# Patient Record
Sex: Female | Born: 1990 | Race: Black or African American | Hispanic: No | Smoking: Never smoker
Health system: Southern US, Community
[De-identification: ages and names within clinical notes are randomized; demographics above are authoritative.]

## PROBLEM LIST (undated history)

## (undated) ENCOUNTER — Emergency Department (HOSPITAL_COMMUNITY): Payer: No Typology Code available for payment source | Source: Home / Self Care

## (undated) ENCOUNTER — Inpatient Hospital Stay (HOSPITAL_COMMUNITY): Payer: Self-pay

## (undated) DIAGNOSIS — J45909 Unspecified asthma, uncomplicated: Secondary | ICD-10-CM

## (undated) DIAGNOSIS — E049 Nontoxic goiter, unspecified: Secondary | ICD-10-CM

## (undated) DIAGNOSIS — F411 Generalized anxiety disorder: Secondary | ICD-10-CM

## (undated) DIAGNOSIS — M419 Scoliosis, unspecified: Secondary | ICD-10-CM

## (undated) DIAGNOSIS — R519 Headache, unspecified: Secondary | ICD-10-CM

## (undated) DIAGNOSIS — N390 Urinary tract infection, site not specified: Secondary | ICD-10-CM

## (undated) DIAGNOSIS — O24419 Gestational diabetes mellitus in pregnancy, unspecified control: Secondary | ICD-10-CM

## (undated) HISTORY — DX: Gestational diabetes mellitus in pregnancy, unspecified control: O24.419

## (undated) HISTORY — PX: OTHER SURGICAL HISTORY: SHX169

---
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
## 2008-04-02 ENCOUNTER — Emergency Department (HOSPITAL_BASED_OUTPATIENT_CLINIC_OR_DEPARTMENT_OTHER): Admission: EM | Admit: 2008-04-02 | Discharge: 2008-04-02 | Payer: Self-pay | Admitting: Emergency Medicine

## 2008-04-02 ENCOUNTER — Ambulatory Visit: Payer: Self-pay | Admitting: Diagnostic Radiology

## 2008-04-19 ENCOUNTER — Emergency Department (HOSPITAL_BASED_OUTPATIENT_CLINIC_OR_DEPARTMENT_OTHER): Admission: EM | Admit: 2008-04-19 | Discharge: 2008-04-19 | Payer: Self-pay | Admitting: Emergency Medicine

## 2008-07-03 ENCOUNTER — Emergency Department (HOSPITAL_BASED_OUTPATIENT_CLINIC_OR_DEPARTMENT_OTHER): Admission: EM | Admit: 2008-07-03 | Discharge: 2008-07-04 | Payer: Self-pay | Admitting: Emergency Medicine

## 2008-07-19 ENCOUNTER — Emergency Department (HOSPITAL_BASED_OUTPATIENT_CLINIC_OR_DEPARTMENT_OTHER): Admission: EM | Admit: 2008-07-19 | Discharge: 2008-07-19 | Payer: Self-pay | Admitting: Emergency Medicine

## 2009-02-16 ENCOUNTER — Emergency Department (HOSPITAL_BASED_OUTPATIENT_CLINIC_OR_DEPARTMENT_OTHER): Admission: EM | Admit: 2009-02-16 | Discharge: 2009-02-16 | Payer: Self-pay | Admitting: Emergency Medicine

## 2009-10-08 ENCOUNTER — Emergency Department (HOSPITAL_BASED_OUTPATIENT_CLINIC_OR_DEPARTMENT_OTHER): Admission: EM | Admit: 2009-10-08 | Discharge: 2009-10-08 | Payer: Self-pay | Admitting: Emergency Medicine

## 2010-03-31 LAB — URINE MICROSCOPIC-ADD ON

## 2010-03-31 LAB — URINALYSIS, ROUTINE W REFLEX MICROSCOPIC
Bilirubin Urine: NEGATIVE
Glucose, UA: NEGATIVE mg/dL
Ketones, ur: NEGATIVE mg/dL
Urobilinogen, UA: 1 mg/dL (ref 0.0–1.0)
pH: 6 (ref 5.0–8.0)

## 2010-03-31 LAB — PREGNANCY, URINE: Preg Test, Ur: NEGATIVE

## 2010-04-25 LAB — URINE MICROSCOPIC-ADD ON

## 2010-04-25 LAB — URINALYSIS, ROUTINE W REFLEX MICROSCOPIC: Urobilinogen, UA: 1 mg/dL (ref 0.0–1.0)

## 2010-05-04 ENCOUNTER — Emergency Department (HOSPITAL_BASED_OUTPATIENT_CLINIC_OR_DEPARTMENT_OTHER)
Admission: EM | Admit: 2010-05-04 | Discharge: 2010-05-04 | Disposition: A | Discharge status: Home / Self Care | Payer: Self-pay | Attending: Emergency Medicine | Admitting: Emergency Medicine

## 2010-05-04 ENCOUNTER — Emergency Department (INDEPENDENT_AMBULATORY_CARE_PROVIDER_SITE_OTHER): Payer: Self-pay

## 2010-05-04 DIAGNOSIS — R079 Chest pain, unspecified: Secondary | ICD-10-CM | POA: Insufficient documentation

## 2010-05-04 DIAGNOSIS — R0602 Shortness of breath: Secondary | ICD-10-CM | POA: Insufficient documentation

## 2010-05-31 NOTE — Consult Note (Signed)
[name], [name] NO.:  [id number]   MEDICAL RECORD NO.:  [id number]          PATIENT TYPE:  EMS   LOCATION:  ED                           FACILITY:  [facility]   PHYSICIAN:  Dionne Ano. Gramig III, M.D.DATE OF BIRTH:  22-Dec-1990   DATE OF CONSULTATION:  01/14/2007  DATE OF DISCHARGE:                                 CONSULTATION   INDICATIONS:  I was asked to see Sierra Evans in the early morning  hours of January 14, 2007.  Kalynne was in the shower at 3:15 this a.m.,  fell, and sustained a fracture dislocation to her small finger left  hand.  She is right-hand dominant.  She denies other complaints at the  present time.  She was seen by the emergency room staff, and  I was  asked to see her.   ALLERGIES:  None.   MEDICATIONS:  None.   PAST MEDICAL HISTORY:  None.   PAST SURGICAL HISTORY:  None.   SOCIAL HISTORY:  She is enrolled in school.  She works at [employer], and denies illicit drug use or smoking history.   PHYSICAL EXAMINATION:  GENERAL:  She is alert and oriented, in no acute  distress.  VITAL SIGNS:  Stable.  CHEST/LUNGS:  Normal.  ABDOMEN:  Nontender.  EXTREMITIES:  Right upper extremity is neurovascularly intact and  atraumatic.  Left hand has refill to the distal tip, but a fracture  dislocation which is obvious about the small finger.  PIP and MCP joints  are normal.  There are no signs of infection.  There is no open injury.  Her skin is intact.   I have reviewed x-rays which show a fracture dislocation of the distal  interphalangeal joint.   IMPRESSION:  Fracture dislocation left small finger.   PLAN:  I have given her a flexor tendon sheath block.  Following this,  she underwent manipulative reduction.  She was then splinted.  I asked  her to stay in a full extension posture until I see her back.  Postreduction x-rays were adequate.  We will see her back Wednesday at  11 a.m.  I asked her to notify us if she has  any problems.  She was  written for Vicodin for postreduction pain, and all questions were  encouraged and answered.  Out-of-work note was written indefinitely  until she recovers and is stable for work activity.          ______________________________  Dionne Ano. Everlene Other, M.D.    Nash Mantis  D:  01/14/2007  T:  01/14/2007  Job:  [id number]

## 2010-07-15 ENCOUNTER — Emergency Department (HOSPITAL_BASED_OUTPATIENT_CLINIC_OR_DEPARTMENT_OTHER)
Admission: EM | Admit: 2010-07-15 | Discharge: 2010-07-15 | Disposition: A | Discharge status: Home / Self Care | Payer: Self-pay | Attending: Emergency Medicine | Admitting: Emergency Medicine

## 2010-07-15 DIAGNOSIS — B019 Varicella without complication: Secondary | ICD-10-CM | POA: Insufficient documentation

## 2010-08-25 ENCOUNTER — Emergency Department (HOSPITAL_BASED_OUTPATIENT_CLINIC_OR_DEPARTMENT_OTHER)
Admission: EM | Admit: 2010-08-25 | Discharge: 2010-08-25 | Disposition: A | Discharge status: Home / Self Care | Payer: Self-pay | Attending: Emergency Medicine | Admitting: Emergency Medicine

## 2010-08-25 DIAGNOSIS — R112 Nausea with vomiting, unspecified: Secondary | ICD-10-CM

## 2010-08-25 DIAGNOSIS — O210 Mild hyperemesis gravidarum: Secondary | ICD-10-CM | POA: Insufficient documentation

## 2010-08-25 DIAGNOSIS — O2390 Unspecified genitourinary tract infection in pregnancy, unspecified trimester: Secondary | ICD-10-CM | POA: Insufficient documentation

## 2010-08-25 DIAGNOSIS — Z3201 Encounter for pregnancy test, result positive: Secondary | ICD-10-CM

## 2010-08-25 DIAGNOSIS — N390 Urinary tract infection, site not specified: Secondary | ICD-10-CM | POA: Insufficient documentation

## 2010-08-25 LAB — URINALYSIS, ROUTINE W REFLEX MICROSCOPIC
Glucose, UA: NEGATIVE mg/dL
Ketones, ur: NEGATIVE mg/dL
Nitrite: NEGATIVE
Specific Gravity, Urine: 1.021 (ref 1.005–1.030)
Urobilinogen, UA: 1 mg/dL (ref 0.0–1.0)
pH: 6 (ref 5.0–8.0)

## 2010-08-25 LAB — URINE MICROSCOPIC-ADD ON

## 2010-08-25 LAB — PREGNANCY, URINE: Preg Test, Ur: POSITIVE

## 2010-08-25 MED ORDER — NITROFURANTOIN MONOHYD MACRO 100 MG PO CAPS: 100.0000 mg | ORAL_CAPSULE | Freq: Two times a day (BID) | ORAL | Status: AC

## 2010-08-25 MED ORDER — ONDANSETRON HCL 4 MG PO TABS: 4.0000 mg | ORAL_TABLET | Freq: Four times a day (QID) | ORAL | Status: AC

## 2010-08-25 NOTE — ED Provider Notes (Addendum)
History     CSN: [id number] Arrival date & time: 08/25/2010 12:23 PM  Chief Complaint  Patient presents with  . Nausea   HPI Comments: Pt states that she has not had a period and she has a positive pregnancy test at home and she wants a confirmation  Patient is a 20 y.o. female presenting with vomiting. The history is provided by the patient. No language interpreter was used.  Emesis  This is a new problem. The current episode started more than 2 days ago. The problem occurs 2 to 4 times per day. The problem has not changed since onset.The emesis has an appearance of stomach contents. There has been no fever. Pertinent negatives include no chills, no cough, no diarrhea, no fever and no headaches.    History reviewed. No pertinent past medical history.  History reviewed. No pertinent past surgical history.  No family history on file.  History  Substance Use Topics  . Smoking status: Never Smoker   . Smokeless tobacco: Not on file  . Alcohol Use: No    OB History    Grav Para Term Preterm Abortions TAB SAB Ect Mult Living                  Review of Systems  Constitutional: Negative for fever and chills.  Respiratory: Negative for cough.   Gastrointestinal: Positive for vomiting. Negative for diarrhea.  Neurological: Negative for headaches.  All other systems reviewed and are negative.    Physical Exam  BP 109/58  Pulse 68  Temp(Src) 98.6 F (37 C) (Oral)  Resp 16  Ht 5\' 2"  (1.575 m)  Wt 128 lb 9.6 oz (58.333 kg)  BMI 23.52 kg/m2  SpO2 100%  LMP 06/18/2010  Physical Exam  Nursing note and vitals reviewed. Constitutional: She is oriented to person, place, and time. She appears well-developed and well-nourished.  HENT:  Head: Normocephalic.  Neck: Normal range of motion.  Cardiovascular: Normal rate and regular rhythm.   Pulmonary/Chest: Effort normal.  Abdominal: Soft. Bowel sounds are normal. There is no tenderness.  Musculoskeletal: Normal range of motion.   Neurological: She is alert and oriented to person, place, and time.  Skin: Skin is warm and dry.  Psychiatric: She has a normal mood and affect.    ED Course  Procedures Results for orders placed during the hospital encounter of 08/25/10  URINALYSIS, ROUTINE W REFLEX MICROSCOPIC      Component Value Range   Color, Urine YELLOW  YELLOW    Appearance CLOUDY (*) CLEAR    Specific Gravity, Urine 1.021  1.005 - 1.030    pH 6.0  5.0 - 8.0    Glucose, UA NEGATIVE  NEGATIVE (mg/dL)   Hgb urine dipstick NEGATIVE  NEGATIVE    Bilirubin Urine NEGATIVE  NEGATIVE    Ketones, ur NEGATIVE  NEGATIVE (mg/dL)   Protein, ur NEGATIVE  NEGATIVE (mg/dL)   Urobilinogen, UA 1.0  0.0 - 1.0 (mg/dL)   Nitrite NEGATIVE  NEGATIVE    Leukocytes, UA SMALL (*) NEGATIVE   PREGNANCY, URINE      Component Value Range   Preg Test, Ur POSITIVE    URINE MICROSCOPIC-ADD ON      Component Value Range   Squamous Epithelial / LPF RARE  RARE    WBC, UA 0-2  <3 (WBC/hpf)   Bacteria, UA MANY (*) RARE    No results found.  MDM Pt is afebrile:will treat for a simple uti in pregnancy:will treat for nausea  Teressa Lower, NP 08/25/10 1311  Medical screening examination/treatment/procedure(s) were conducted as a shared visit with non-physician practitioner(s) and myself.      Sunnie Nielsen, MD 08/25/10 1610  Sunnie Nielsen, MD 09/22/10 [phone]

## 2010-08-25 NOTE — ED Notes (Signed)
Nausea x 1 week-no period x 2 months-denies pain-G1P1

## 2010-10-06 LAB — URINALYSIS, ROUTINE W REFLEX MICROSCOPIC
Hgb urine dipstick: NEGATIVE
Nitrite: NEGATIVE
Protein, ur: NEGATIVE
Urobilinogen, UA: 1

## 2010-10-06 LAB — URINE MICROSCOPIC-ADD ON

## 2010-10-07 LAB — CBC
HCT: 37
Hemoglobin: 12.7
MCHC: 34.3
MCV: 96.9
Platelets: 284
RBC: 3.82 — ABNORMAL LOW
RDW: 12.8
WBC: 8.2

## 2010-10-07 LAB — GC/CHLAMYDIA PROBE AMP, GENITAL: Chlamydia, DNA Probe: NEGATIVE

## 2010-10-07 LAB — WET PREP, GENITAL: Trich, Wet Prep: NONE SEEN

## 2010-10-07 LAB — HCG, QUANTITATIVE, PREGNANCY

## 2010-10-13 LAB — URINALYSIS, ROUTINE W REFLEX MICROSCOPIC
Bilirubin Urine: NEGATIVE
Nitrite: NEGATIVE
Protein, ur: NEGATIVE
Specific Gravity, Urine: 1.012
Urobilinogen, UA: 1

## 2010-10-13 LAB — URINE CULTURE: Colony Count: >=100000

## 2010-10-13 LAB — POCT PREGNANCY, URINE
Operator id: 261601
Preg Test, Ur: NEGATIVE

## 2010-10-13 LAB — URINE MICROSCOPIC-ADD ON

## 2010-10-14 LAB — URINALYSIS, ROUTINE W REFLEX MICROSCOPIC
Bilirubin Urine: NEGATIVE
Glucose, UA: NEGATIVE
Hgb urine dipstick: NEGATIVE
Ketones, ur: NEGATIVE
Specific Gravity, Urine: 1.014
pH: 6.5

## 2010-10-14 LAB — PREGNANCY, URINE: Preg Test, Ur: NEGATIVE

## 2010-10-14 LAB — URINE MICROSCOPIC-ADD ON

## 2010-11-01 LAB — COMPREHENSIVE METABOLIC PANEL
ALT: 13
AST: 21
Albumin: 3.4 — ABNORMAL LOW
Albumin: 3.9
Alkaline Phosphatase: 73
Calcium: 9.1
Chloride: 109
Creatinine, Ser: 0.79
GFR calc Af Amer: >60
GFR calc non Af Amer: >60
Glucose, Bld: 104 — ABNORMAL HIGH
Potassium: 4
Sodium: 141
Total Bilirubin: 0.5
Total Protein: 6.4
Total Protein: 7.1

## 2010-11-01 LAB — GC/CHLAMYDIA PROBE AMP, GENITAL: GC Probe Amp, Genital: NEGATIVE

## 2010-11-01 LAB — URINALYSIS, ROUTINE W REFLEX MICROSCOPIC
Bilirubin Urine: NEGATIVE
Hgb urine dipstick: NEGATIVE
Nitrite: NEGATIVE
Nitrite: NEGATIVE
Protein, ur: NEGATIVE
Specific Gravity, Urine: 1.023
Specific Gravity, Urine: 1.029
Urobilinogen, UA: 1
pH: 6

## 2010-11-01 LAB — CBC
Hemoglobin: 13.1
MCHC: 34.5
MCV: 94.2
Platelets: 273
Platelets: 301
RDW: 12.5
RDW: 12.9
WBC: 7.5

## 2010-11-01 LAB — URINE MICROSCOPIC-ADD ON

## 2010-11-01 LAB — URINE CULTURE: Culture: NO GROWTH

## 2010-11-01 LAB — WET PREP, GENITAL
Trich, Wet Prep: NONE SEEN
Yeast Wet Prep HPF POC: NONE SEEN

## 2010-11-01 LAB — DIFFERENTIAL
Basophils Relative: 1
Eosinophils Absolute: 0.3
Eosinophils Relative: 3
Lymphocytes Relative: 39
Lymphs Abs: 3.2
Monocytes Absolute: 0.7
Monocytes Absolute: 0.8
Monocytes Relative: 11 — ABNORMAL HIGH
Monocytes Relative: 9

## 2010-11-01 LAB — PREGNANCY, URINE
Preg Test, Ur: NEGATIVE
Preg Test, Ur: NEGATIVE

## 2012-04-05 ENCOUNTER — Encounter (HOSPITAL_BASED_OUTPATIENT_CLINIC_OR_DEPARTMENT_OTHER): Payer: Self-pay | Admitting: *Deleted

## 2012-04-05 ENCOUNTER — Emergency Department (HOSPITAL_BASED_OUTPATIENT_CLINIC_OR_DEPARTMENT_OTHER)
Admission: EM | Admit: 2012-04-05 | Discharge: 2012-04-06 | Disposition: A | Discharge status: Home / Self Care | Payer: Medicaid Other | Attending: Emergency Medicine | Admitting: Emergency Medicine

## 2012-04-05 DIAGNOSIS — R509 Fever, unspecified: Secondary | ICD-10-CM | POA: Insufficient documentation

## 2012-04-05 DIAGNOSIS — N390 Urinary tract infection, site not specified: Secondary | ICD-10-CM

## 2012-04-05 DIAGNOSIS — Z8739 Personal history of other diseases of the musculoskeletal system and connective tissue: Secondary | ICD-10-CM | POA: Insufficient documentation

## 2012-04-05 DIAGNOSIS — N920 Excessive and frequent menstruation with regular cycle: Secondary | ICD-10-CM

## 2012-04-05 HISTORY — DX: Scoliosis, unspecified: M41.9

## 2012-04-05 LAB — WET PREP, GENITAL
Trich, Wet Prep: NONE SEEN
Yeast Wet Prep HPF POC: NONE SEEN

## 2012-04-05 NOTE — ED Notes (Signed)
MD at bedside. 

## 2012-04-05 NOTE — ED Notes (Signed)
Reports vaginal bleeding x 2 months and abd pain

## 2012-04-06 LAB — URINE MICROSCOPIC-ADD ON

## 2012-04-06 LAB — URINALYSIS, ROUTINE W REFLEX MICROSCOPIC
Bilirubin Urine: NEGATIVE
Ketones, ur: NEGATIVE mg/dL
Nitrite: NEGATIVE
Protein, ur: NEGATIVE mg/dL
Specific Gravity, Urine: 1.028 (ref 1.005–1.030)
Urobilinogen, UA: 1 mg/dL (ref 0.0–1.0)

## 2012-04-06 LAB — GC/CHLAMYDIA PROBE AMP
CT Probe RNA: NEGATIVE
GC Probe RNA: NEGATIVE

## 2012-04-06 MED ORDER — KETOROLAC TROMETHAMINE 60 MG/2ML IM SOLN
60.0000 mg | Freq: Once | INTRAMUSCULAR | Status: AC
  Administered 2012-04-06: 60 mg via INTRAMUSCULAR
  Filled 2012-04-06: qty 2

## 2012-04-06 MED ORDER — HYDROCODONE-ACETAMINOPHEN 5-325 MG PO TABS: 1.0000 | ORAL_TABLET | Freq: Four times a day (QID) | ORAL | Status: DC | PRN

## 2012-04-06 MED ORDER — CIPROFLOXACIN HCL 500 MG PO TABS: 500.0000 mg | ORAL_TABLET | Freq: Two times a day (BID) | ORAL | Status: DC

## 2012-04-06 NOTE — ED Provider Notes (Addendum)
History     CSN: [id number]  Arrival date & time 04/05/12  2319   First MD Initiated Contact with Patient 04/05/12 2345      Chief Complaint  Patient presents with  . Vaginal Bleeding    (Consider location/radiation/quality/duration/timing/severity/associated sxs/prior treatment) Patient is a 22 y.o. female presenting with vaginal bleeding. The history is provided by the patient. No language interpreter was used.  Vaginal Bleeding This is a chronic problem. The current episode started more than 1 week ago (2 months). The problem occurs constantly. The problem has been gradually improving. Pertinent negatives include no chest pain, no abdominal pain, no headaches and no shortness of breath. Nothing aggravates the symptoms. Nothing relieves the symptoms. She has tried nothing for the symptoms. The treatment provided mild relief.  Was started on provera today by her PMD and wanted a check so came to the ED.  3 pads a day x 2 months has not seen her GYN/.  Some cramps today  Past Medical History  Diagnosis Date  . Scoliosis     History reviewed. No pertinent past surgical history.  No family history on file.  History  Substance Use Topics  . Smoking status: Never Smoker   . Smokeless tobacco: Never Used  . Alcohol Use: No    OB History   Grav Para Term Preterm Abortions TAB SAB Ect Mult Living                  Review of Systems  Respiratory: Negative for shortness of breath.   Cardiovascular: Negative for chest pain.  Gastrointestinal: Negative for abdominal pain.  Genitourinary: Positive for vaginal bleeding.  Neurological: Negative for headaches.  All other systems reviewed and are negative.    Allergies  Review of patient's allergies indicates no known allergies.  Home Medications   Current Outpatient Rx  Name  Route  Sig  Dispense  Refill  . medroxyPROGESTERone (PROVERA) 10 MG tablet   Oral   Take 10 mg by mouth daily.         . ciprofloxacin (CIPRO)  500 MG tablet   Oral   Take 1 tablet (500 mg total) by mouth 2 (two) times daily. One po bid x 7 days   14 tablet   0   . HYDROcodone-acetaminophen (NORCO) 5-325 MG per tablet   Oral   Take 1 tablet by mouth every 6 (six) hours as needed for pain.   6 tablet   0     BP 102/80  Pulse 78  Temp(Src) 98.5 F (36.9 C) (Oral)  Resp 18  Ht 5\' 2"  (1.575 m)  Wt 158 lb (71.668 kg)  BMI 28.89 kg/m2  SpO2 100%  LMP 02/06/2012  Physical Exam  Constitutional: She is oriented to person, place, and time. She appears well-developed and well-nourished. No distress.  HENT:  Head: Normocephalic and atraumatic.  Mouth/Throat: Oropharyngeal exudate present.  Eyes: Conjunctivae are normal. Pupils are equal, round, and reactive to light.  Neck: Normal range of motion. Neck supple.  Cardiovascular: Normal rate, regular rhythm and intact distal pulses.   Pulmonary/Chest: Effort normal and breath sounds normal.  Abdominal: Soft. Bowel sounds are normal. She exhibits no distension and no mass. There is no tenderness. There is no rebound and no guarding.  Genitourinary: No vaginal discharge found.  Chaperone present os close no CMT no bleeding  Musculoskeletal: Normal range of motion.  Neurological: She is alert and oriented to person, place, and time.  Skin: Skin is warm  and dry.  Psychiatric: She has a normal mood and affect.    ED Course  Procedures (including critical care time)  Labs Reviewed  WET PREP, GENITAL - Abnormal; Notable for the following:    WBC, Wet Prep HPF POC FEW (*)    All other components within normal limits  URINALYSIS, ROUTINE W REFLEX MICROSCOPIC - Abnormal; Notable for the following:    APPearance CLOUDY (*)    Hgb urine dipstick LARGE (*)    Leukocytes, UA SMALL (*)    All other components within normal limits  URINE MICROSCOPIC-ADD ON - Abnormal; Notable for the following:    Squamous Epithelial / LPF MANY (*)    Bacteria, UA MANY (*)    All other components  within normal limits  GC/CHLAMYDIA PROBE AMP  URINE CULTURE  PREGNANCY, URINE   No results found.   1. Menorrhagia   2. UTI (lower urinary tract infection)       MDM  Follow up with [facility].  Return for fevers, intractable bleeding cramping or any concerns.  Patient verbalizes understanding and agrees to follow up        Ritesh Opara K Teriann Livingood-Rasch, MD 04/06/12 0136  Annasofia Pohl K Leeon Makar-Rasch, MD 04/06/12 [phone]

## 2012-04-07 LAB — URINE CULTURE: Colony Count: 95000

## 2012-10-03 ENCOUNTER — Emergency Department (HOSPITAL_BASED_OUTPATIENT_CLINIC_OR_DEPARTMENT_OTHER)
Admission: EM | Admit: 2012-10-03 | Discharge: 2012-10-03 | Disposition: A | Discharge status: Home / Self Care | Payer: Medicaid Other | Attending: Emergency Medicine | Admitting: Emergency Medicine

## 2012-10-03 ENCOUNTER — Encounter (HOSPITAL_BASED_OUTPATIENT_CLINIC_OR_DEPARTMENT_OTHER): Payer: Self-pay

## 2012-10-03 DIAGNOSIS — H6691 Otitis media, unspecified, right ear: Secondary | ICD-10-CM

## 2012-10-03 DIAGNOSIS — R0982 Postnasal drip: Secondary | ICD-10-CM | POA: Insufficient documentation

## 2012-10-03 DIAGNOSIS — Z792 Long term (current) use of antibiotics: Secondary | ICD-10-CM | POA: Insufficient documentation

## 2012-10-03 DIAGNOSIS — Z79899 Other long term (current) drug therapy: Secondary | ICD-10-CM | POA: Insufficient documentation

## 2012-10-03 DIAGNOSIS — J3489 Other specified disorders of nose and nasal sinuses: Secondary | ICD-10-CM | POA: Insufficient documentation

## 2012-10-03 DIAGNOSIS — H6690 Otitis media, unspecified, unspecified ear: Secondary | ICD-10-CM | POA: Insufficient documentation

## 2012-10-03 MED ORDER — AMOXICILLIN 500 MG PO CAPS: 500.0000 mg | ORAL_CAPSULE | Freq: Three times a day (TID) | ORAL | Status: DC

## 2012-10-03 MED ORDER — CETIRIZINE HCL 10 MG PO CAPS: 1.0000 | ORAL_CAPSULE | ORAL | Status: DC

## 2012-10-03 NOTE — ED Notes (Signed)
bilat ear ache x 4 days

## 2012-10-03 NOTE — ED Provider Notes (Signed)
CSN: [id number]     Arrival date & time 10/03/12  1559 History   First MD Initiated Contact with Patient 10/03/12 1700     Chief Complaint  Patient presents with  . Otalgia   (Consider location/radiation/quality/duration/timing/severity/associated sxs/prior Treatment) HPI Comments: Patient presents with four-day history of worsening ear pain. She states that both ears hurt but her right ear is worse than the left. She's had also some runny nose and nasal congestion. She does have a history of allergic rhinitis. She's been taking Zyrtec but she is almost out of it. She denies any drainage from the ear. She denies any hearing changes. She denies any headache or dizziness. She denies any nausea vomiting or fevers. She denies any cough or chest congestion.  Patient is a 22 y.o. female presenting with ear pain.  Otalgia Associated symptoms: congestion and rhinorrhea   Associated symptoms: no abdominal pain, no cough, no diarrhea, no fever, no headaches, no rash and no vomiting     Past Medical History  Diagnosis Date  . Scoliosis    History reviewed. No pertinent past surgical history. No family history on file. History  Substance Use Topics  . Smoking status: Never Smoker   . Smokeless tobacco: Never Used  . Alcohol Use: No   OB History   Grav Para Term Preterm Abortions TAB SAB Ect Mult Living                 Review of Systems  Constitutional: Negative for fever, chills, diaphoresis and fatigue.  HENT: Positive for ear pain, congestion, rhinorrhea, sneezing and postnasal drip.   Eyes: Negative.   Respiratory: Negative for cough, chest tightness and shortness of breath.   Cardiovascular: Negative for chest pain and leg swelling.  Gastrointestinal: Negative for nausea, vomiting, abdominal pain, diarrhea and blood in stool.  Genitourinary: Negative for frequency, hematuria, flank pain and difficulty urinating.  Musculoskeletal: Negative for back pain and arthralgias.  Skin:  Negative for rash.  Neurological: Negative for dizziness, speech difficulty, weakness, numbness and headaches.    Allergies  Review of patient's allergies indicates no known allergies.  Home Medications   Current Outpatient Rx  Name  Route  Sig  Dispense  Refill  . amoxicillin (AMOXIL) 500 MG capsule   Oral   Take 1 capsule (500 mg total) by mouth 3 (three) times daily.   30 capsule   0   . Cetirizine HCl 10 MG CAPS   Oral   Take 1 capsule (10 mg total) by mouth 1 day or 1 dose.   30 capsule   0   . ciprofloxacin (CIPRO) 500 MG tablet   Oral   Take 1 tablet (500 mg total) by mouth 2 (two) times daily. One po bid x 7 days   14 tablet   0   . HYDROcodone-acetaminophen (NORCO) 5-325 MG per tablet   Oral   Take 1 tablet by mouth every 6 (six) hours as needed for pain.   6 tablet   0   . medroxyPROGESTERone (PROVERA) 10 MG tablet   Oral   Take 10 mg by mouth daily.          BP 112/65  Pulse 84  Temp(Src) 99.4 F (37.4 C) (Oral)  Resp 16  Ht 5\' 2"  (1.575 m)  Wt 163 lb (73.936 kg)  BMI 29.81 kg/m2  SpO2 100%  LMP 09/17/2012 Physical Exam  Constitutional: She is oriented to person, place, and time. She appears well-developed and well-nourished.  HENT:  Head: Normocephalic and atraumatic.  Mouth/Throat: Oropharynx is clear and moist.  Patient has fluid behind both ears. Bowel right ear is more cloudy with an erythematous and inflamed TM. There is no pain over the mastoid. There is no elevation of the ear.  Eyes: Pupils are equal, round, and reactive to light.  Neck: Normal range of motion. Neck supple.  Cardiovascular: Normal rate, regular rhythm and normal heart sounds.   Pulmonary/Chest: Effort normal and breath sounds normal. No respiratory distress. She has no wheezes. She has no rales. She exhibits no tenderness.  Abdominal: Soft. Bowel sounds are normal. There is no tenderness. There is no rebound and no guarding.  Musculoskeletal: Normal range of motion.  She exhibits no edema.  Lymphadenopathy:    She has no cervical adenopathy.  Neurological: She is alert and oriented to person, place, and time.  Skin: Skin is warm and dry. No rash noted.  Psychiatric: She has a normal mood and affect.    ED Course  Procedures (including critical care time) Labs Review Labs Reviewed - No data to display Imaging Review No results found.  MDM   1. Otitis media, right    Patient presents with URI symptoms and evidence of right otitis media. I gave her prescription for amoxicillin and Zyrtec. I advised her followup with her primary care physician in 2 weeks to have her ears rechecked or sooner if her symptoms are not improving.    Rolan Bucco, MD 10/03/12 [phone]

## 2012-10-30 ENCOUNTER — Emergency Department (HOSPITAL_BASED_OUTPATIENT_CLINIC_OR_DEPARTMENT_OTHER)
Admission: EM | Admit: 2012-10-30 | Discharge: 2012-10-30 | Disposition: A | Discharge status: Home / Self Care | Payer: Medicaid Other | Attending: Emergency Medicine | Admitting: Emergency Medicine

## 2012-10-30 ENCOUNTER — Encounter (HOSPITAL_BASED_OUTPATIENT_CLINIC_OR_DEPARTMENT_OTHER): Payer: Self-pay | Admitting: Emergency Medicine

## 2012-10-30 DIAGNOSIS — M26609 Unspecified temporomandibular joint disorder, unspecified side: Secondary | ICD-10-CM | POA: Insufficient documentation

## 2012-10-30 DIAGNOSIS — Z79899 Other long term (current) drug therapy: Secondary | ICD-10-CM | POA: Insufficient documentation

## 2012-10-30 DIAGNOSIS — M26629 Arthralgia of temporomandibular joint, unspecified side: Secondary | ICD-10-CM

## 2012-10-30 DIAGNOSIS — Z8739 Personal history of other diseases of the musculoskeletal system and connective tissue: Secondary | ICD-10-CM | POA: Insufficient documentation

## 2012-10-30 MED ORDER — DIAZEPAM 5 MG PO TABS
5.0000 mg | ORAL_TABLET | Freq: Once | ORAL | Status: AC
  Administered 2012-10-30: 5 mg via ORAL
  Filled 2012-10-30: qty 1

## 2012-10-30 MED ORDER — HYDROCODONE-ACETAMINOPHEN 5-325 MG PO TABS: 1.0000 | ORAL_TABLET | Freq: Four times a day (QID) | ORAL | Status: DC | PRN

## 2012-10-30 MED ORDER — DIAZEPAM 5 MG PO TABS: 5.0000 mg | ORAL_TABLET | Freq: Three times a day (TID) | ORAL | Status: DC | PRN

## 2012-10-30 MED ORDER — HYDROCODONE-ACETAMINOPHEN 5-325 MG PO TABS
1.0000 | ORAL_TABLET | Freq: Once | ORAL | Status: AC
  Administered 2012-10-30: 1 via ORAL
  Filled 2012-10-30: qty 1

## 2012-10-30 NOTE — ED Notes (Signed)
Pt states she was awakened by pain from the left side of her face/head/neck, 10/10, one hour ago.

## 2012-10-30 NOTE — ED Provider Notes (Signed)
CSN: [id number]     Arrival date & time 10/30/12  0127 History   First MD Initiated Contact with Patient 10/30/12 0140     Chief Complaint  Patient presents with  . Facial Pain   (Consider location/radiation/quality/duration/timing/severity/associated sxs/prior Treatment) HPI This is a 22 year old female who went to bed about 9 PM without pain. She awoke about 2-1/2 hours later with a sharp pain in her left TMJ radiating to the left side of her face and head. It is worse with palpation or movement of her left jaw. She does not relate it to a tooth. There is no cervical lymphadenopathy. She is unaware of having a fever. She states the pain is severe.  Past Medical History  Diagnosis Date  . Scoliosis    History reviewed. No pertinent past surgical history. History reviewed. No pertinent family history. History  Substance Use Topics  . Smoking status: Never Smoker   . Smokeless tobacco: Never Used  . Alcohol Use: No   OB History   Grav Para Term Preterm Abortions TAB SAB Ect Mult Living                 Review of Systems  All other systems reviewed and are negative.    Allergies  Review of patient's allergies indicates no known allergies.  Home Medications   Current Outpatient Rx  Name  Route  Sig  Dispense  Refill  . Cetirizine HCl 10 MG CAPS   Oral   Take 1 capsule (10 mg total) by mouth 1 day or 1 dose.   30 capsule   0   . amoxicillin (AMOXIL) 500 MG capsule   Oral   Take 1 capsule (500 mg total) by mouth 3 (three) times daily.   30 capsule   0   . ciprofloxacin (CIPRO) 500 MG tablet   Oral   Take 1 tablet (500 mg total) by mouth 2 (two) times daily. One po bid x 7 days   14 tablet   0   . HYDROcodone-acetaminophen (NORCO) 5-325 MG per tablet   Oral   Take 1 tablet by mouth every 6 (six) hours as needed for pain.   6 tablet   0   . medroxyPROGESTERone (PROVERA) 10 MG tablet   Oral   Take 10 mg by mouth daily.          BP 123/67  Pulse 72   Temp(Src) 98.9 F (37.2 C) (Oral)  Resp 18  Ht 5\' 2"  (1.575 m)  Wt 160 lb (72.576 kg)  BMI 29.26 kg/m2  SpO2 99%  LMP 10/28/2012  Physical Exam General: Well-developed, well-nourished female in no acute distress; appearance consistent with age of record HENT: normocephalic; atraumatic; tender left TMJ with pain on movement of the mandible; no dental caries seen; TMs normal; salivary glands nontender Eyes: pupils equal, round and reactive to light; extraocular muscles intact Neck: supple; no lymphadenopathy Heart: regular rate and rhythm Lungs: clear to auscultation bilaterally Abdomen: soft; nondistended Extremities: No deformity; full range of motion Neurologic: Awake, alert and oriented; motor function intact in all extremities and symmetric; no facial droop Skin: Warm and dry Psychiatric: Flat affect    ED Course  Procedures (including critical care time)  MDM  Examination consistent with TMJ dysfunction. Patient denies a history of tooth grinding. She has a dentist with whom she can followup.    Hanley Seamen, MD 10/30/12 [phone]

## 2013-03-09 ENCOUNTER — Encounter (HOSPITAL_BASED_OUTPATIENT_CLINIC_OR_DEPARTMENT_OTHER): Payer: Self-pay | Admitting: Emergency Medicine

## 2013-03-09 ENCOUNTER — Emergency Department (HOSPITAL_BASED_OUTPATIENT_CLINIC_OR_DEPARTMENT_OTHER)
Admission: EM | Admit: 2013-03-09 | Discharge: 2013-03-10 | Disposition: A | Discharge status: Home / Self Care | Payer: BC Managed Care – PPO | Attending: Emergency Medicine | Admitting: Emergency Medicine

## 2013-03-09 DIAGNOSIS — Z79899 Other long term (current) drug therapy: Secondary | ICD-10-CM | POA: Insufficient documentation

## 2013-03-09 DIAGNOSIS — R51 Headache: Secondary | ICD-10-CM | POA: Insufficient documentation

## 2013-03-09 DIAGNOSIS — M4120 Other idiopathic scoliosis, site unspecified: Secondary | ICD-10-CM | POA: Insufficient documentation

## 2013-03-09 DIAGNOSIS — R519 Headache, unspecified: Secondary | ICD-10-CM

## 2013-03-09 NOTE — ED Notes (Signed)
Patient states that she has had a headache for 3 days and now has a nosebleed. She is also c/o intermittent swelling of feet and hands.

## 2013-03-09 NOTE — ED Provider Notes (Signed)
CSN: [id number]     Arrival date & time 03/09/13  2217 History  This chart was scribed for Enid Skeens, MD by Nicholos Johns, ED scribe. This patient was seen in room MH12/MH12 and the patient's care was started at 12:55 AM.  Chief Complaint  Patient presents with  . Headache   Patient is a 23 y.o. female presenting with headaches. The history is provided by the patient. No language interpreter was used.  Headache Pain location:  Generalized Quality:  Dull Radiates to:  Does not radiate Onset quality:  Gradual Duration:  3 days Timing:  Intermittent Progression:  Unchanged Chronicity:  New Associated symptoms: no cough, no neck pain, no numbness and no sore throat    HPI Comments: Sierra Evans is a 23 y.o. female who presents to the Emergency Department complaining of HA, onset 3 days ago. Pt states she would go to sleep, wake up and head would be pounding. Pain is more severe than before. Pt also reports epistaxis and swelling in the hands and feet. Pt is not on any blood thinners and denies any injury related to nose bleed. Pt is on Naproxen and only takes when scoliosis is bothering her. Does not smoke. Denies vomiting, numbness, cough, weakness, neck pain, or sore throat.  Past Medical History  Diagnosis Date  . Scoliosis    History reviewed. No pertinent past surgical history. No family history on file. History  Substance Use Topics  . Smoking status: Never Smoker   . Smokeless tobacco: Never Used  . Alcohol Use: No   OB History   Grav Para Term Preterm Abortions TAB SAB Ect Mult Living                 Review of Systems  HENT: Negative for sore throat.   Respiratory: Negative for cough.   Musculoskeletal: Negative for neck pain.  Neurological: Positive for headaches. Negative for weakness and numbness.  All other systems reviewed and are negative.   Allergies  Review of patient's allergies indicates no known allergies.  Home Medications   Current  Outpatient Rx  Name  Route  Sig  Dispense  Refill  . amoxicillin (AMOXIL) 500 MG capsule   Oral   Take 1 capsule (500 mg total) by mouth 3 (three) times daily.   30 capsule   0   . Cetirizine HCl 10 MG CAPS   Oral   Take 1 capsule (10 mg total) by mouth 1 day or 1 dose.   30 capsule   0   . ciprofloxacin (CIPRO) 500 MG tablet   Oral   Take 1 tablet (500 mg total) by mouth 2 (two) times daily. One po bid x 7 days   14 tablet   0   . diazepam (VALIUM) 5 MG tablet   Oral   Take 1 tablet (5 mg total) by mouth every 8 (eight) hours as needed (jaw spasms).   20 tablet   0   . HYDROcodone-acetaminophen (NORCO) 5-325 MG per tablet   Oral   Take 1 tablet by mouth every 6 (six) hours as needed for pain.   6 tablet   0   . HYDROcodone-acetaminophen (NORCO/VICODIN) 5-325 MG per tablet   Oral   Take 1-2 tablets by mouth every 6 (six) hours as needed for pain.   20 tablet   0   . medroxyPROGESTERone (PROVERA) 10 MG tablet   Oral   Take 10 mg by mouth daily.  Triage Vitals: BP 112/75  Pulse 75  Temp(Src) 98.6 F (37 C) (Oral)  Resp 18  Ht 5\' 2"  (1.575 m)  Wt 155 lb (70.308 kg)  BMI 28.34 kg/m2 Physical Exam  Nursing note and vitals reviewed. Constitutional: She is oriented to person, place, and time. She appears well-developed and well-nourished.  HENT:  Head: Normocephalic and atraumatic.  Mild dried blood in left nare; not actively bleeding. No hematoma  Eyes: Conjunctivae and EOM are normal. Pupils are equal, round, and reactive to light.  Visual fields intact.  Neck: Normal range of motion. Neck supple.  Cardiovascular: Normal rate and normal heart sounds.   Pulmonary/Chest: Effort normal. No respiratory distress.  Musculoskeletal: Normal range of motion. She exhibits no edema and no tenderness.  Neurological: She is oriented to person, place, and time. She has normal reflexes.  No facial droop. Cranial nerves intact. No drift in the arms. Finger-nose  intact. Good strength. Gross sensation intact.  Skin: Skin is warm and dry.  Psychiatric: She has a normal mood and affect. Her behavior is normal.    ED Course  Procedures (including critical care time) DIAGNOSTIC STUDIES:   COORDINATION OF CARE: At 12:58 AM: Discussed treatment plan with patient which includes pain medication. Patient agrees.   Labs Review Labs Reviewed - No data to display Imaging Review No results found.  EKG Interpretation   None       MDM   Final diagnoses:  Headache  Epistaxis  I personally performed the services described in this documentation, which was scribed in my presence. The recorded information has been reviewed and is accurate.Marland Kitchen. Epistaxis resolved with pressure, no bleeding in ED.   Gradual onset HA, well appearing, normal neuro exam, no meningismus.  Pt improved significantly in ED.  Results and differential diagnosis were discussed with the patient. Close follow up outpatient was discussed, patient comfortable with the plan.      Enid SkeensJoshua M Karlina Suares, MD 03/10/13 [phone]

## 2013-03-10 MED ORDER — METOCLOPRAMIDE HCL 10 MG PO TABS
10.0000 mg | ORAL_TABLET | Freq: Once | ORAL | Status: AC
  Administered 2013-03-10: 10 mg via ORAL
  Filled 2013-03-10: qty 1

## 2013-03-10 MED ORDER — KETOROLAC TROMETHAMINE 30 MG/ML IJ SOLN
INTRAMUSCULAR | Status: AC
  Administered 2013-03-10: 60 mg
  Filled 2013-03-10: qty 2

## 2013-03-10 MED ORDER — DIPHENHYDRAMINE HCL 25 MG PO CAPS
25.0000 mg | ORAL_CAPSULE | Freq: Once | ORAL | Status: DC
  Filled 2013-03-10: qty 1

## 2013-03-10 MED ORDER — KETOROLAC TROMETHAMINE 60 MG/2ML IM SOLN: 60.0000 mg | Freq: Once | INTRAMUSCULAR | Status: AC

## 2013-03-10 NOTE — Discharge Instructions (Signed)
Take ibuprofen and tylenol for pain. If you were given medicines take as directed.  If you are on coumadin or contraceptives realize their levels and effectiveness is altered by many different medicines.  If you have any reaction (rash, tongues swelling, other) to the medicines stop taking and see a physician.   Please follow up as directed and return to the ER or see a physician for new or worsening symptoms.  Thank you.

## 2013-07-04 ENCOUNTER — Inpatient Hospital Stay (HOSPITAL_COMMUNITY): Payer: 59

## 2013-07-04 ENCOUNTER — Encounter (HOSPITAL_COMMUNITY): Payer: Self-pay | Admitting: *Deleted

## 2013-07-04 ENCOUNTER — Inpatient Hospital Stay (HOSPITAL_COMMUNITY)
Admission: AD | Admit: 2013-07-04 | Discharge: 2013-07-04 | Disposition: A | Discharge status: Home / Self Care | Payer: 59 | Source: Ambulatory Visit | Attending: Obstetrics & Gynecology | Admitting: Obstetrics & Gynecology

## 2013-07-04 DIAGNOSIS — R109 Unspecified abdominal pain: Secondary | ICD-10-CM | POA: Insufficient documentation

## 2013-07-04 DIAGNOSIS — O009 Unspecified ectopic pregnancy without intrauterine pregnancy: Secondary | ICD-10-CM

## 2013-07-04 DIAGNOSIS — O00109 Unspecified tubal pregnancy without intrauterine pregnancy: Secondary | ICD-10-CM | POA: Insufficient documentation

## 2013-07-04 DIAGNOSIS — R630 Anorexia: Secondary | ICD-10-CM | POA: Insufficient documentation

## 2013-07-04 DIAGNOSIS — O99891 Other specified diseases and conditions complicating pregnancy: Secondary | ICD-10-CM | POA: Insufficient documentation

## 2013-07-04 DIAGNOSIS — O9989 Other specified diseases and conditions complicating pregnancy, childbirth and the puerperium: Principal | ICD-10-CM

## 2013-07-04 LAB — DIFFERENTIAL
BASOS PCT: 0 % (ref 0–1)
Basophils Absolute: 0 10*3/uL (ref 0.0–0.1)
Eosinophils Absolute: 0.3 10*3/uL (ref 0.0–0.7)
Eosinophils Relative: 2 % (ref 0–5)
LYMPHS PCT: 23 % (ref 12–46)
Lymphs Abs: 3 10*3/uL (ref 0.7–4.0)
MONOS PCT: 8 % (ref 3–12)
Monocytes Absolute: 1.1 10*3/uL — ABNORMAL HIGH (ref 0.1–1.0)
NEUTROS ABS: 8.9 10*3/uL — AB (ref 1.7–7.7)
NEUTROS PCT: 67 % (ref 43–77)

## 2013-07-04 LAB — URINALYSIS, ROUTINE W REFLEX MICROSCOPIC
Bilirubin Urine: NEGATIVE
Glucose, UA: NEGATIVE mg/dL
Hgb urine dipstick: NEGATIVE
Ketones, ur: NEGATIVE mg/dL
Leukocytes, UA: NEGATIVE
NITRITE: NEGATIVE
Protein, ur: NEGATIVE mg/dL
SPECIFIC GRAVITY, URINE: 1.015 (ref 1.005–1.030)
UROBILINOGEN UA: 1 mg/dL (ref 0.0–1.0)
pH: 8.5 — ABNORMAL HIGH (ref 5.0–8.0)

## 2013-07-04 LAB — COMPREHENSIVE METABOLIC PANEL
ALBUMIN: 2.8 g/dL — AB (ref 3.5–5.2)
ALT: 17 U/L (ref 0–35)
AST: 17 U/L (ref 0–37)
Alkaline Phosphatase: 67 U/L (ref 39–117)
BILIRUBIN TOTAL: 0.3 mg/dL (ref 0.3–1.2)
BUN: 8 mg/dL (ref 6–23)
CHLORIDE: 104 meq/L (ref 96–112)
CO2: 25 mEq/L (ref 19–32)
CREATININE: 0.71 mg/dL (ref 0.50–1.10)
Calcium: 8.7 mg/dL (ref 8.4–10.5)
GFR calc Af Amer: >90 mL/min (ref >90)
GFR calc non Af Amer: >90 mL/min (ref >90)
Glucose, Bld: 108 mg/dL — ABNORMAL HIGH (ref 70–99)
POTASSIUM: 3.8 meq/L (ref 3.7–5.3)
Sodium: 140 mEq/L (ref 137–147)
TOTAL PROTEIN: 5.5 g/dL — AB (ref 6.0–8.3)

## 2013-07-04 LAB — CBC
HCT: 42.8 % (ref 36.0–46.0)
Hemoglobin: 14.3 g/dL (ref 12.0–15.0)
MCH: 31 pg (ref 26.0–34.0)
MCHC: 33.4 g/dL (ref 30.0–36.0)
MCV: 92.6 fL (ref 78.0–100.0)
PLATELETS: 344 10*3/uL (ref 150–400)
RBC: 4.62 MIL/uL (ref 3.87–5.11)
RDW: 13 % (ref 11.5–15.5)
WBC: 13 10*3/uL — AB (ref 4.0–10.5)

## 2013-07-04 LAB — AMYLASE: AMYLASE: 85 U/L (ref 0–105)

## 2013-07-04 LAB — LIPASE, BLOOD: Lipase: 24 U/L (ref 11–59)

## 2013-07-04 LAB — HCG, QUANTITATIVE, PREGNANCY: hCG, Beta Chain, Quant, S: 653 m[IU]/mL — ABNORMAL HIGH (ref <5)

## 2013-07-04 LAB — POCT PREGNANCY, URINE: PREG TEST UR: POSITIVE — AB

## 2013-07-04 MED ORDER — HYDROMORPHONE HCL PF 1 MG/ML IJ SOLN
1.0000 mg | Freq: Once | INTRAMUSCULAR | Status: AC
  Administered 2013-07-04: 1 mg via INTRAMUSCULAR
  Filled 2013-07-04: qty 1

## 2013-07-04 MED ORDER — PROMETHAZINE HCL 25 MG PO TABS: 25.0000 mg | ORAL_TABLET | Freq: Four times a day (QID) | ORAL | Status: DC | PRN

## 2013-07-04 MED ORDER — METHOTREXATE INJECTION FOR WOMEN'S HOSPITAL
50.0000 mg/m2 | Freq: Once | INTRAMUSCULAR | Status: AC
Start: 2013-07-04 — End: 2013-07-04
  Administered 2013-07-04: 95 mg via INTRAMUSCULAR
  Filled 2013-07-04: qty 1.9

## 2013-07-04 MED ORDER — OXYCODONE-ACETAMINOPHEN 5-325 MG PO TABS: 1.0000 | ORAL_TABLET | Freq: Four times a day (QID) | ORAL | Status: DC | PRN

## 2013-07-04 NOTE — MAU Note (Signed)
Pt vomited in u/s but states feels better now. States was just really hot in u/s

## 2013-07-04 NOTE — Discharge Instructions (Signed)
Methotrexate Treatment for an Ectopic Pregnancy, Care After °Refer to this sheet in the next few weeks. These instructions provide you with information on caring for yourself after your procedure. Your health care provider may also give you more specific instructions. Your treatment has been planned according to current medical practices, but problems sometimes occur. Call your health care provider if you have any problems or questions after your procedure. °WHAT TO EXPECT AFTER THE PROCEDURE °You may have some abdominal cramping, vaginal bleeding, and fatigue in the first few days after taking methotrexate. Some other possible side effects of methotrexate include: °· Nausea. °· Vomiting. °· Diarrhea. °· Mouth sores. °· Swelling or irritation of the lining of your lungs (pneumonitis). °· Liver damage. °· Hair loss. °HOME CARE INSTRUCTIONS  °After you have received the methotrexate medicine, you need to be careful of your activities and watch your condition for several weeks. It may take 1 week before your hormone levels return to normal. °· Keep all follow-up appointments as directed by your health care provider. °· Avoid traveling too far away from your health care provider. °· Do not have sexual intercourse until your health care provider says it is safe to do so. °· You may resume your usual diet. °· Limit strenuous activity. °· Do not take folic acid, prenatal vitamins, or other vitamins that contain folic acid. °· Do not take aspirin, ibuprofen, or naproxen (nonsteroidal anti-inflammatory drugs [NSAIDs]). °· Do not drink alcohol. °SEEK MEDICAL CARE IF:  °· You cannot control your nausea and vomiting. °· You cannot control your diarrhea. °· You have sores in your mouth and want treatment. °· You need pain medicine for your abdominal pain. °· You have a rash. °· You are having a reaction to the medicine. °SEEK IMMEDIATE MEDICAL CARE IF:  °· You have increasing abdominal or pelvic pain. °· You notice increased  bleeding. °· You feel light-headed, or you faint. °· You have shortness of breath. °· Your heart rate increases. °· You have a cough. °· You have chills. °· You have a fever. °Document Released: 12/22/2010 Document Revised: 01/07/2013 Document Reviewed: 10/21/2012 °ExitCare® Patient Information ©2015 [employer]. This information is not intended to replace advice given to you by your health care provider. Make sure you discuss any questions you have with your health care provider. ° °

## 2013-07-04 NOTE — MAU Note (Addendum)
I've been having pain in my stomach all week. More on right side upper and lower. I'm having BMs but it hurts when my bowels move or even pass gas. Pain is sharp and intermittent. Denies N/V/D. Decreased appetite

## 2013-07-04 NOTE — MAU Note (Signed)
MTX hand out given to pt and questions asked

## 2013-07-04 NOTE — Progress Notes (Signed)
Written and verbal d/c instructions given and understanding voiced. To return Monday for repeat lab work and sooner for any concerns

## 2013-07-04 NOTE — MAU Provider Note (Signed)
History     CSN: [id number]  Arrival date and time: 07/04/13 [id number]   First Provider Initiated Contact with Patient 07/04/13 2023      Chief Complaint  Patient presents with  . Abdominal Pain   HPI Ms. Sierra Evans is a 23 y.o. G3P2002 at 949w3d who presents to MAU today with complaint of right sided pain that became worse today. She denies N/V/D, constipation, fever, vaginal bleeding, discharge, UTI symptoms today. She states that she was on Depo Provera and missed her last injection last month. She states normal LMP on 06/17/13. She reports the pain is a burning sensation. She does endorse a decreased appetite.   OB History   Grav Para Term Preterm Abortions TAB SAB Ect Mult Living   3 2 2       2       Past Medical History  Diagnosis Date  . Scoliosis     History reviewed. No pertinent past surgical history.  Family History  Problem Relation Age of Onset  . Alcohol abuse Mother   . Cirrhosis Mother   . COPD Father   . Cancer Brother   . Cancer Maternal Grandfather   . Stroke Maternal Grandfather   . Hypertension Maternal Grandfather   . Cancer Paternal Grandmother     History  Substance Use Topics  . Smoking status: Never Smoker   . Smokeless tobacco: Never Used  . Alcohol Use: No    Allergies: No Known Allergies  No prescriptions prior to admission    Review of Systems  Constitutional: Negative for fever and malaise/fatigue.  Gastrointestinal: Positive for abdominal pain. Negative for nausea, vomiting, diarrhea, constipation and blood in stool.  Genitourinary: Negative for dysuria, urgency and frequency.       Neg - vaginal bleeding, discharge   Physical Exam   Blood pressure 119/66, pulse 97, temperature 98.5 F (36.9 C), resp. rate 20, height 5\' 2"  (1.575 m), weight 175 lb 12.8 oz (79.742 kg), last menstrual period 06/17/2013.  Physical Exam  Constitutional: She is oriented to person, place, and time. She appears well-developed and  well-nourished. No distress.  HENT:  Head: Normocephalic.  Cardiovascular: Normal rate.   Respiratory: Effort normal.  GI: Soft. Bowel sounds are normal. She exhibits no distension and no mass. There is tenderness (moderate tenderness to palpation of the RUQ and RLQ ). There is no rebound and no guarding.  Neurological: She is alert and oriented to person, place, and time.  Skin: Skin is warm and dry. No erythema.  Psychiatric: She has a normal mood and affect.   Results for orders placed during the hospital encounter of 07/04/13 (from the past 24 hour(s))  URINALYSIS, ROUTINE W REFLEX MICROSCOPIC     Status: Abnormal   Collection Time    07/04/13  8:07 PM      Result Value Ref Range   Color, Urine YELLOW  YELLOW   APPearance CLEAR  CLEAR   Specific Gravity, Urine 1.015  1.005 - 1.030   pH 8.5 (*) 5.0 - 8.0   Glucose, UA NEGATIVE  NEGATIVE mg/dL   Hgb urine dipstick NEGATIVE  NEGATIVE   Bilirubin Urine NEGATIVE  NEGATIVE   Ketones, ur NEGATIVE  NEGATIVE mg/dL   Protein, ur NEGATIVE  NEGATIVE mg/dL   Urobilinogen, UA 1.0  0.0 - 1.0 mg/dL   Nitrite NEGATIVE  NEGATIVE   Leukocytes, UA NEGATIVE  NEGATIVE  POCT PREGNANCY, URINE     Status: Abnormal   Collection Time  07/04/13  8:15 PM      Result Value Ref Range   Preg Test, Ur POSITIVE (*) NEGATIVE  CBC     Status: Abnormal   Collection Time    07/04/13  8:35 PM      Result Value Ref Range   WBC 13.0 (*) 4.0 - 10.5 K/uL   RBC 4.62  3.87 - 5.11 MIL/uL   Hemoglobin 14.3  12.0 - 15.0 g/dL   HCT 40.942.8  81.136.0 - 91.446.0 %   MCV 92.6  78.0 - 100.0 fL   MCH 31.0  26.0 - 34.0 pg   MCHC 33.4  30.0 - 36.0 g/dL   RDW 78.213.0  95.611.5 - 21.315.5 %   Platelets 344  150 - 400 K/uL  COMPREHENSIVE METABOLIC PANEL     Status: Abnormal   Collection Time    07/04/13  8:35 PM      Result Value Ref Range   Sodium 140  137 - 147 mEq/L   Potassium 3.8  3.7 - 5.3 mEq/L   Chloride 104  96 - 112 mEq/L   CO2 25  19 - 32 mEq/L   Glucose, Bld 108 (*) 70 - 99  mg/dL   BUN 8  6 - 23 mg/dL   Creatinine, Ser 0.860.71  0.50 - 1.10 mg/dL   Calcium 8.7  8.4 - 57.810.5 mg/dL   Total Protein 5.5 (*) 6.0 - 8.3 g/dL   Albumin 2.8 (*) 3.5 - 5.2 g/dL   AST 17  0 - 37 U/L   ALT 17  0 - 35 U/L   Alkaline Phosphatase 67  39 - 117 U/L   Total Bilirubin 0.3  0.3 - 1.2 mg/dL   GFR calc non Af Amer >90  >90 mL/min   GFR calc Af Amer >90  >90 mL/min  HCG, QUANTITATIVE, PREGNANCY     Status: Abnormal   Collection Time    07/04/13  8:35 PM      Result Value Ref Range   hCG, Beta Chain, Quant, S 653 (*) <5 mIU/mL  AMYLASE     Status: None   Collection Time    07/04/13  8:35 PM      Result Value Ref Range   Amylase 85  0 - 105 U/L  LIPASE, BLOOD     Status: None   Collection Time    07/04/13  8:35 PM      Result Value Ref Range   Lipase 24  11 - 59 U/L   [place] Ob Comp Less 14 Wks  07/04/2013   CLINICAL DATA:  Abdominal pain, early pregnancy. Beta HCG equals 653  EXAM: OBSTETRIC <14 WK ULTRASOUND  TECHNIQUE: Transabdominal ultrasound was performed for evaluation of the gestation as well as the maternal uterus and adnexal regions.  COMPARISON:  None.  FINDINGS: Intrauterine gestational sac: There is irregular fluid collection within the endometrial canal measuring at 2.5 by 1.5 cm by 1.1 cm and has a solid component.  Yolk sac:  Not identified  Embryo:  Not identified  Maternal uterus/adnexae: There is a rounded solid appearing masslike lesion adjacent to the right ovary measuring 3.7 x 2.5 x 2.6 cm. There paraovarian mass has minimal blood flow. The adjacent ovary appears normal with small peripheral follicles.  The left ovary is normal.  Trace free fluid  IMPRESSION: 1. Irregular fluid collection with endometrium with a solid component suggesting and a small amount of clot. 2. A para ovarian solid mass with minimal blood flow. Typically  ectopic pregnancy have greater blood flow but certainly cannot exclude ectopic pregnancy. Findings conveyed toJULIE ETHIER on 07/04/2013   at22:28.   Electronically Signed   By: Genevive Bi M.D.   On: 07/04/2013 22:28   US Ob Transvaginal  07/04/2013   CLINICAL DATA:  Abdominal pain, early pregnancy. Beta HCG equals 653  EXAM: OBSTETRIC <14 WK ULTRASOUND  TECHNIQUE: Transabdominal ultrasound was performed for evaluation of the gestation as well as the maternal uterus and adnexal regions.  COMPARISON:  None.  FINDINGS: Intrauterine gestational sac: There is irregular fluid collection within the endometrial canal measuring at 2.5 by 1.5 cm by 1.1 cm and has a solid component.  Yolk sac:  Not identified  Embryo:  Not identified  Maternal uterus/adnexae: There is a rounded solid appearing masslike lesion adjacent to the right ovary measuring 3.7 x 2.5 x 2.6 cm. There paraovarian mass has minimal blood flow. The adjacent ovary appears normal with small peripheral follicles.  The left ovary is normal.  Trace free fluid  IMPRESSION: 1. Irregular fluid collection with endometrium with a solid component suggesting and a small amount of clot. 2. A para ovarian solid mass with minimal blood flow. Typically ectopic pregnancy have greater blood flow but certainly cannot exclude ectopic pregnancy. Findings conveyed toJULIE ETHIER on 07/04/2013  at22:28.   Electronically Signed   By: Genevive Bi M.D.   On: 07/04/2013 22:28    MAU Course  Procedures None  MDM Patient denies sexual activity x 4 months +UPT LMP 06/17/13 per patient was normal CBC, CMP, quant hCG today Elevated quant hCG discussed with patient OB US ordered Dr. Amil Amen from Radiology called MAU with report. He states irregular fluid collection in the uterus may be a IUGS that is irregular in shape and does not contain YS or FP. He states if this is a IUGS it is large enough to be determined a failed pregnancy.  There is a mass in the right adnexa concerning for ectopic but without the blood flow expected for ectopic pregnancy.  Discussed with Dr. Marice Potter. Patient is a candidate for  MTX Discussed at length the concerns with the [place] with the patient today. She would like to proceed with MTX today. Handout given with information about MTX.  MTX given in MAU today  Assessment and Plan  A: Concern for ectopic pregnancy in the right adnexa  P: Discharge home Rx for Percocet given to patient Bleeding/Ecoptic precautions discussed Care after MTX information given Patient will return to MAU on 07/07/13 for follow-up day #4 quant hCG or sooner if symptoms worsen  Freddi Starr, PA-C  07/04/2013, 10:34 PM

## 2013-07-07 ENCOUNTER — Encounter: Payer: Self-pay | Admitting: *Deleted

## 2013-07-07 ENCOUNTER — Other Ambulatory Visit: Payer: 59

## 2013-07-07 DIAGNOSIS — O039 Complete or unspecified spontaneous abortion without complication: Secondary | ICD-10-CM

## 2013-07-07 NOTE — Progress Notes (Signed)
Attempted to call patient regarding her current symptoms. Patients phone is disconnected. Emergency contact is not answering. Certified Letter sent to patient today. Patient needs to go to MAU for followup on Thursday. This information was included in letter.

## 2013-07-08 LAB — HCG, QUANTITATIVE, PREGNANCY: hCG, Beta Chain, Quant, S: 690.3 m[IU]/mL

## 2013-08-29 ENCOUNTER — Encounter: Payer: Self-pay | Admitting: General Practice

## 2013-09-08 ENCOUNTER — Encounter (HOSPITAL_COMMUNITY): Payer: Self-pay | Admitting: Emergency Medicine

## 2013-09-08 ENCOUNTER — Emergency Department (HOSPITAL_COMMUNITY): Payer: 59

## 2013-09-08 ENCOUNTER — Emergency Department (HOSPITAL_COMMUNITY)
Admission: EM | Admit: 2013-09-08 | Discharge: 2013-09-08 | Disposition: A | Discharge status: Home / Self Care | Payer: 59 | Attending: Emergency Medicine | Admitting: Emergency Medicine

## 2013-09-08 DIAGNOSIS — W230XXA Caught, crushed, jammed, or pinched between moving objects, initial encounter: Secondary | ICD-10-CM | POA: Insufficient documentation

## 2013-09-08 DIAGNOSIS — Y9389 Activity, other specified: Secondary | ICD-10-CM | POA: Insufficient documentation

## 2013-09-08 DIAGNOSIS — S6990XA Unspecified injury of unspecified wrist, hand and finger(s), initial encounter: Secondary | ICD-10-CM | POA: Diagnosis not present

## 2013-09-08 DIAGNOSIS — Z79899 Other long term (current) drug therapy: Secondary | ICD-10-CM | POA: Insufficient documentation

## 2013-09-08 DIAGNOSIS — Z8739 Personal history of other diseases of the musculoskeletal system and connective tissue: Secondary | ICD-10-CM | POA: Diagnosis not present

## 2013-09-08 DIAGNOSIS — Y9289 Other specified places as the place of occurrence of the external cause: Secondary | ICD-10-CM | POA: Insufficient documentation

## 2013-09-08 DIAGNOSIS — S6980XA Other specified injuries of unspecified wrist, hand and finger(s), initial encounter: Secondary | ICD-10-CM | POA: Insufficient documentation

## 2013-09-08 DIAGNOSIS — S6992XA Unspecified injury of left wrist, hand and finger(s), initial encounter: Secondary | ICD-10-CM

## 2013-09-08 MED ORDER — ACETAMINOPHEN 500 MG PO TABS
1000.0000 mg | ORAL_TABLET | Freq: Once | ORAL | Status: AC
  Administered 2013-09-08: 1000 mg via ORAL
  Filled 2013-09-08: qty 2

## 2013-09-08 NOTE — Discharge Instructions (Signed)
°  Nail Avulsion Injury °Nail avulsion means that you have lost the whole, or part of a nail. The nail will usually grow back in 2 to 6 months. If your injury damaged the growth center of the nail, the nail may be deformed, split, or not stuck to the nail bed. Sometimes the avulsed nail is stitched back in place. This provides temporary protection to the nail bed until the new nail grows in.  °HOME CARE INSTRUCTIONS  °· Raise (elevate) your injury as much as possible. °· Protect the injury and cover it with bandages (dressings) or splints as instructed. °· Change dressings as instructed. °SEEK MEDICAL CARE IF:  °· There is increasing pain, redness, or swelling. °· You cannot move your fingers or toes. °Document Released: 02/10/2004 Document Revised: 03/27/2011 Document Reviewed: 12/04/2008 °Exit[employer]® Patient Information ©2015 [employer]. This information is not intended to replace advice given to you by your health care provider. Make sure you discuss any questions you have with your health care provider. ° ° °

## 2013-09-08 NOTE — ED Provider Notes (Signed)
CSN: [id number]     Arrival date & time 09/08/13  1607 History  This chart was scribed for a non-physician practitioner, Roxy Horseman, PA-C working with Mirian Mo, MD by Julian Hy, ED Scribe. The patient was seen in [facility]. The patient's care was started at 4:43 PM.     Chief Complaint  Patient presents with  . Finger Injury   The history is provided by the patient. No language interpreter was used.   HPI Comments: Sierra Evans is a 23 y.o. female who presents to the Emergency Department complaining of new, moderate left pinky finger injury. Pt describes her pain as pinching. Pt rates her pain as 10/10.  Pt reports some associated swelling from the finger. Pt reports she was lifting a car trunk and her finger got caught underneath he license plate. Pt reports  Pt denies taking anything for her pain. Pt is wearing acrylic nails.   Past Medical History  Diagnosis Date  . Scoliosis    History reviewed. No pertinent past surgical history. Family History  Problem Relation Age of Onset  . Alcohol abuse Mother   . Cirrhosis Mother   . COPD Father   . Cancer Brother   . Cancer Maternal Grandfather   . Stroke Maternal Grandfather   . Hypertension Maternal Grandfather   . Cancer Paternal Grandmother    History  Substance Use Topics  . Smoking status: Never Smoker   . Smokeless tobacco: Never Used  . Alcohol Use: No   OB History   Grav Para Term Preterm Abortions TAB SAB Ect Mult Living   Review of Systems  Constitutional: Negative for fever and chills.  Respiratory: Negative for shortness of breath.   Cardiovascular: Negative for chest pain.  Gastrointestinal: Negative for nausea, vomiting, diarrhea and constipation.  Genitourinary: Negative for dysuria.  Musculoskeletal: Positive for arthralgias and joint swelling.  Skin: Positive for wound.      Allergies  Strawberry  Home Medications   Prior to Admission medications    Medication Sig Start Date End Date Taking? Authorizing Provider  albuterol (PROVENTIL HFA;VENTOLIN HFA) 108 (90 BASE) MCG/ACT inhaler Inhale 1 puff into the lungs every 6 (six) hours as needed for wheezing or shortness of breath.   Yes Historical Provider, MD   Triage BP 122/75  Pulse 75  Temp(Src) 98.7 F (37.1 C) (Oral)  Resp 22  Wt 175 lb (79.379 kg)  SpO2 98%  LMP 08/29/2013  Breastfeeding? Unknown Physical Exam  Nursing note and vitals reviewed. Constitutional: She is oriented to person, place, and time. She appears well-developed and well-nourished. No distress.  HENT:  Head: Normocephalic and atraumatic.  Eyes: Conjunctivae and EOM are normal.  Neck: Neck supple. No tracheal deviation present.  Cardiovascular: Normal rate.   Pulmonary/Chest: Effort normal. No respiratory distress.  Musculoskeletal: Normal range of motion.  Left, small finger moderately tender to palpation and swollen at the DIP. No laceration. ROM and strength limited, secondary to pain.  Neurological: She is alert and oriented to person, place, and time.  Skin: Skin is warm and dry.  Acrylic nails on both hands. No obvious nail evulsion. No laceration.  Psychiatric: She has a normal mood and affect. Her behavior is normal.    ED Course  Procedures (including critical care time) DIAGNOSTIC STUDIES: Oxygen Saturation is 98% on RA, normal by my interpretation.    COORDINATION OF CARE: 4:46 PM- Patient informed of current  plan for treatment and evaluation and agrees with plan at this time.    Labs Review Labs Reviewed - No data to display  Imaging Review Dg Finger Little Left  09/08/2013   CLINICAL DATA:  Left fifth digit injury, pain  EXAM: LEFT LITTLE FINGER 2+V  COMPARISON:  None.  FINDINGS: There is no evidence of fracture or dislocation. There is no evidence of arthropathy or other focal bone abnormality. Soft tissues are unremarkable.  IMPRESSION: Negative.   Electronically Signed   By: Elige Ko   On: 09/08/2013 17:38     EKG Interpretation None      MDM   Final diagnoses:  Finger injury, left, initial encounter    Patient with finger injury. Plain films are negative. No obvious nail avulsion. Will splint the finger to provide comfort and protection. Recommend primary care followup. Patient understands and agrees with the plan. She is stable and ready for discharge.  I personally performed the services described in this documentation, which was scribed in my presence. The recorded information has been reviewed and is accurate.     Roxy Horseman, PA-C 09/08/13 [phone]

## 2013-09-08 NOTE — ED Notes (Signed)
Called ortho and made aware of finger splint

## 2013-09-08 NOTE — ED Notes (Signed)
Pt c/o left pinky finger pain after injuring it on the trunk of her car.  Pt has on artifical nails and believes had ripped part of her real nail underneath it. Pt states that finger is swollen compared to other pinky finger and was draining something from it earlier.

## 2013-09-09 ENCOUNTER — Telehealth (HOSPITAL_BASED_OUTPATIENT_CLINIC_OR_DEPARTMENT_OTHER): Payer: Self-pay | Admitting: Emergency Medicine

## 2013-09-09 NOTE — ED Provider Notes (Signed)
Medical screening examination/treatment/procedure(s) were performed by non-physician practitioner and as supervising physician I was immediately available for consultation/collaboration.   EKG Interpretation None        Mirian Mo, MD 09/09/13 [phone]

## 2013-10-10 ENCOUNTER — Emergency Department (HOSPITAL_COMMUNITY)
Admission: EM | Admit: 2013-10-10 | Discharge: 2013-10-10 | Disposition: A | Discharge status: Home / Self Care | Payer: 59 | Attending: Emergency Medicine | Admitting: Emergency Medicine

## 2013-10-10 ENCOUNTER — Encounter (HOSPITAL_COMMUNITY): Payer: Self-pay | Admitting: Emergency Medicine

## 2013-10-10 ENCOUNTER — Emergency Department (HOSPITAL_COMMUNITY): Payer: 59

## 2013-10-10 DIAGNOSIS — S139XXA Sprain of joints and ligaments of unspecified parts of neck, initial encounter: Secondary | ICD-10-CM | POA: Insufficient documentation

## 2013-10-10 DIAGNOSIS — Z791 Long term (current) use of non-steroidal anti-inflammatories (NSAID): Secondary | ICD-10-CM | POA: Diagnosis not present

## 2013-10-10 DIAGNOSIS — S0993XA Unspecified injury of face, initial encounter: Secondary | ICD-10-CM | POA: Insufficient documentation

## 2013-10-10 DIAGNOSIS — Y9389 Activity, other specified: Secondary | ICD-10-CM | POA: Insufficient documentation

## 2013-10-10 DIAGNOSIS — Y92410 Unspecified street and highway as the place of occurrence of the external cause: Secondary | ICD-10-CM | POA: Insufficient documentation

## 2013-10-10 DIAGNOSIS — Z79899 Other long term (current) drug therapy: Secondary | ICD-10-CM | POA: Diagnosis not present

## 2013-10-10 DIAGNOSIS — S161XXA Strain of muscle, fascia and tendon at neck level, initial encounter: Secondary | ICD-10-CM

## 2013-10-10 DIAGNOSIS — Z8739 Personal history of other diseases of the musculoskeletal system and connective tissue: Secondary | ICD-10-CM | POA: Diagnosis not present

## 2013-10-10 DIAGNOSIS — S199XXA Unspecified injury of neck, initial encounter: Secondary | ICD-10-CM

## 2013-10-10 MED ORDER — NAPROXEN 250 MG PO TABS
500.0000 mg | ORAL_TABLET | Freq: Once | ORAL | Status: AC
  Administered 2013-10-10: 500 mg via ORAL
  Filled 2013-10-10: qty 2

## 2013-10-10 MED ORDER — NAPROXEN 500 MG PO TABS: 500.0000 mg | ORAL_TABLET | Freq: Two times a day (BID) | ORAL | Status: DC

## 2013-10-10 MED ORDER — CYCLOBENZAPRINE HCL 10 MG PO TABS: 10.0000 mg | ORAL_TABLET | Freq: Two times a day (BID) | ORAL | Status: DC | PRN

## 2013-10-10 MED ORDER — HYDROCODONE-ACETAMINOPHEN 5-325 MG PO TABS
2.0000 | ORAL_TABLET | Freq: Once | ORAL | Status: AC
  Administered 2013-10-10: 2 via ORAL
  Filled 2013-10-10: qty 2

## 2013-10-10 NOTE — ED Notes (Signed)
Pt c/o neck pain. C collar removed by Dr. Hyacinth Meeker
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
## 2013-10-10 NOTE — Discharge Instructions (Signed)
Please call your doctor for a followup appointment within 24-48 hours. When you talk to your doctor please let them know that you were seen in the emergency department and have them acquire all of your records so that they can discuss the findings with you and formulate a treatment plan to fully care for your new and ongoing problems. ° °
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
## 2013-10-29 DIAGNOSIS — L680 Hirsutism: Secondary | ICD-10-CM | POA: Insufficient documentation

## 2013-10-29 DIAGNOSIS — G8929 Other chronic pain: Secondary | ICD-10-CM | POA: Insufficient documentation

## 2013-11-17 ENCOUNTER — Encounter (HOSPITAL_COMMUNITY): Payer: Self-pay | Admitting: Emergency Medicine

## 2013-12-28 DIAGNOSIS — R109 Unspecified abdominal pain: Secondary | ICD-10-CM | POA: Diagnosis present

## 2013-12-28 DIAGNOSIS — Z3202 Encounter for pregnancy test, result negative: Secondary | ICD-10-CM | POA: Diagnosis not present

## 2013-12-28 DIAGNOSIS — Z8739 Personal history of other diseases of the musculoskeletal system and connective tissue: Secondary | ICD-10-CM | POA: Insufficient documentation

## 2013-12-28 DIAGNOSIS — Z791 Long term (current) use of non-steroidal anti-inflammatories (NSAID): Secondary | ICD-10-CM | POA: Diagnosis not present

## 2013-12-28 DIAGNOSIS — Z79899 Other long term (current) drug therapy: Secondary | ICD-10-CM | POA: Insufficient documentation

## 2013-12-28 DIAGNOSIS — N12 Tubulo-interstitial nephritis, not specified as acute or chronic: Secondary | ICD-10-CM | POA: Insufficient documentation

## 2013-12-29 ENCOUNTER — Emergency Department (HOSPITAL_COMMUNITY)
Admission: EM | Admit: 2013-12-29 | Discharge: 2013-12-29 | Disposition: A | Discharge status: Home / Self Care | Payer: 59 | Attending: Emergency Medicine | Admitting: Emergency Medicine

## 2013-12-29 ENCOUNTER — Encounter (HOSPITAL_COMMUNITY): Payer: Self-pay | Admitting: *Deleted

## 2013-12-29 DIAGNOSIS — N12 Tubulo-interstitial nephritis, not specified as acute or chronic: Secondary | ICD-10-CM

## 2013-12-29 LAB — URINALYSIS, ROUTINE W REFLEX MICROSCOPIC
Bilirubin Urine: NEGATIVE
Glucose, UA: NEGATIVE mg/dL
Hgb urine dipstick: NEGATIVE
KETONES UR: NEGATIVE mg/dL
NITRITE: NEGATIVE
Protein, ur: NEGATIVE mg/dL
Specific Gravity, Urine: 1.021 (ref 1.005–1.030)
Urobilinogen, UA: 1 mg/dL (ref 0.0–1.0)
pH: 6.5 (ref 5.0–8.0)

## 2013-12-29 LAB — URINE MICROSCOPIC-ADD ON

## 2013-12-29 LAB — POC URINE PREG, ED: PREG TEST UR: NEGATIVE

## 2013-12-29 MED ORDER — KETOROLAC TROMETHAMINE 30 MG/ML IJ SOLN
30.0000 mg | Freq: Once | INTRAMUSCULAR | Status: AC
  Administered 2013-12-29: 30 mg via INTRAVENOUS
  Filled 2013-12-29: qty 1

## 2013-12-29 MED ORDER — HYDROCODONE-ACETAMINOPHEN 5-325 MG PO TABS
1.0000 | ORAL_TABLET | Freq: Once | ORAL | Status: AC
  Administered 2013-12-29: 1 via ORAL
  Filled 2013-12-29: qty 1

## 2013-12-29 MED ORDER — TRAMADOL HCL 50 MG PO TABS: 50.0000 mg | ORAL_TABLET | Freq: Four times a day (QID) | ORAL | Status: DC | PRN

## 2013-12-29 MED ORDER — CIPROFLOXACIN HCL 500 MG PO TABS: 500.0000 mg | ORAL_TABLET | Freq: Two times a day (BID) | ORAL | Status: DC

## 2013-12-29 MED ORDER — DEXTROSE 5 % IV SOLN
1.0000 g | Freq: Once | INTRAVENOUS | Status: AC
  Administered 2013-12-29: 1 g via INTRAVENOUS
  Filled 2013-12-29: qty 10

## 2013-12-29 MED ORDER — IBUPROFEN 600 MG PO TABS: 600.0000 mg | ORAL_TABLET | Freq: Four times a day (QID) | ORAL | Status: DC | PRN

## 2013-12-29 NOTE — Discharge Instructions (Signed)
Pyelonephritis, Adult °Pyelonephritis is a kidney infection. In general, there are 2 main types of pyelonephritis: °· Infections that come on quickly without any warning (acute pyelonephritis). °· Infections that persist for a long period of time (chronic pyelonephritis). °CAUSES  °Two main causes of pyelonephritis are: °· Bacteria traveling from the bladder to the kidney. This is a problem especially in pregnant women. The urine in the bladder can become filled with bacteria from multiple causes, including: °¨ Inflammation of the prostate gland (prostatitis). °¨ Sexual intercourse in females. °¨ Bladder infection (cystitis). °· Bacteria traveling from the bloodstream to the tissue part of the kidney. °Problems that may increase your risk of getting a kidney infection include: °· Diabetes. °· Kidney stones or bladder stones. °· Cancer. °· Catheters placed in the bladder. °· Other abnormalities of the kidney or ureter. °SYMPTOMS  °· Abdominal pain. °· Pain in the side or flank area. °· Fever. °· Chills. °· Upset stomach. °· Blood in the urine (dark urine). °· Frequent urination. °· Strong or persistent urge to urinate. °· Burning or stinging when urinating. °DIAGNOSIS  °Your caregiver may diagnose your kidney infection based on your symptoms. A urine sample may also be taken. °TREATMENT  °In general, treatment depends on how severe the infection is.  °· If the infection is mild and caught early, your caregiver may treat you with oral antibiotics and send you home. °· If the infection is more severe, the bacteria may have gotten into the bloodstream. This will require intravenous (IV) antibiotics and a hospital stay. Symptoms may include: °¨ High fever. °¨ Severe flank pain. °¨ Shaking chills. °· Even after a hospital stay, your caregiver may require you to be on oral antibiotics for a period of time. °· Other treatments may be required depending upon the cause of the infection. °HOME CARE INSTRUCTIONS  °· Take your  antibiotics as directed. Finish them even if you start to feel better. °· Make an appointment to have your urine checked to make sure the infection is gone. °· Drink enough fluids to keep your urine clear or pale yellow. °· Take medicines for the bladder if you have urgency and frequency of urination as directed by your caregiver. °SEEK IMMEDIATE MEDICAL CARE IF:  °· You have a fever or persistent symptoms for more than 2-3 days. °· You have a fever and your symptoms suddenly get worse. °· You are unable to take your antibiotics or fluids. °· You develop shaking chills. °· You experience extreme weakness or fainting. °· There is no improvement after 2 days of treatment. °MAKE SURE YOU: °· Understand these instructions. °· Will watch your condition. °· Will get help right away if you are not doing well or get worse. °Document Released: 01/02/2005 Document Revised: 07/04/2011 Document Reviewed: 06/08/2010 °[employer]® Patient Information ©2015 [employer]. This information is not intended to replace advice given to you by your health care provider. Make sure you discuss any questions you have with your health care provider. ° °

## 2013-12-29 NOTE — ED Notes (Signed)
The pt is c/o rt flank pain all day  No nv or d.. lmp nov 5th

## 2013-12-29 NOTE — ED Notes (Signed)
Pt unable to void at this time. 

## 2013-12-29 NOTE — ED Provider Notes (Signed)
CSN: [id number]     Arrival date & time 12/28/13  2358 History   First MD Initiated Contact with Patient 12/29/13 0014     Chief Complaint  Patient presents with  . Flank Pain    Patient is a 23 y.o. female presenting with flank pain. The history is provided by the patient. No language interpreter was used.  Flank Pain Pertinent negatives include no chest pain, no abdominal pain and no shortness of breath.   This chart was scribed for Loren Raceravid Aryani Daffern, MD by Andrew Auaven Small, ED Scribe. This patient was seen in room D36C/D36C and the patient's care was started at 6:11 AM.  HPI Comments:  Sierra Evans is a 23 y.o. female who present to the Emergency Department complaining of gradually worsening, sharp right flank pain. Pt states pain has been dull and achy with intermittent sharp shooting pain. She denies falls, injury, and heavy lifting. She denies worsening pain with deep breathes and eating. She denies similar pain in past. She denies leg swelling but has had swelling in ankles. Pt denies hematuria, dysuria, frequency or nausea and emesis. She's had no fever or chills.  Past Medical History  Diagnosis Date  . Scoliosis    History reviewed. No pertinent past surgical history. Family History  Problem Relation Age of Onset  . Alcohol abuse Mother   . Cirrhosis Mother   . COPD Father   . Cancer Brother   . Cancer Maternal Grandfather   . Stroke Maternal Grandfather   . Hypertension Maternal Grandfather   . Cancer Paternal Grandmother    History  Substance Use Topics  . Smoking status: Never Smoker   . Smokeless tobacco: Never Used  . Alcohol Use: No   OB History    Gravida Para Term Preterm AB TAB SAB Ectopic Multiple Living   3 2 2       2      Review of Systems  Constitutional: Negative for fever and chills.  Respiratory: Negative for cough and shortness of breath.   Cardiovascular: Negative for chest pain.  Gastrointestinal: Negative for nausea, vomiting, abdominal  pain and diarrhea.  Genitourinary: Positive for flank pain. Negative for dysuria, frequency, hematuria and difficulty urinating.  Musculoskeletal: Positive for back pain.  All other systems reviewed and are negative.  Allergies  Strawberry  Home Medications   Prior to Admission medications   Medication Sig Start Date End Date Taking? Authorizing Provider  acetaminophen (TYLENOL) 325 MG tablet Take 650 mg by mouth every 6 (six) hours as needed for mild pain, fever or headache.   Yes Historical Provider, MD  albuterol (PROVENTIL HFA;VENTOLIN HFA) 108 (90 BASE) MCG/ACT inhaler Inhale 1 puff into the lungs every 6 (six) hours as needed for wheezing or shortness of breath.   Yes Historical Provider, MD  cyclobenzaprine (FLEXERIL) 10 MG tablet Take 1 tablet (10 mg total) by mouth 2 (two) times daily as needed for muscle spasms. 10/10/13  Yes Vida RollerBrian D Miller, MD  naproxen (NAPROSYN) 500 MG tablet Take 1 tablet (500 mg total) by mouth 2 (two) times daily with a meal. Patient taking differently: Take 500 mg by mouth 2 (two) times daily as needed for mild pain.  10/10/13  Yes Vida RollerBrian D Miller, MD  ciprofloxacin (CIPRO) 500 MG tablet Take 1 tablet (500 mg total) by mouth 2 (two) times daily. One po bid x 7 days 12/29/13   Loren Raceravid Riggin Cuttino, MD  ibuprofen (ADVIL,MOTRIN) 600 MG tablet Take 1 tablet (600 mg total) by mouth  every 6 (six) hours as needed. 12/29/13   Loren Raceravid Osmani Kersten, MD  naproxen (NAPROSYN) 500 MG tablet Take 500 mg by mouth 2 (two) times daily as needed for mild pain, moderate pain or headache.    Historical Provider, MD  traMADol (ULTRAM) 50 MG tablet Take 1 tablet (50 mg total) by mouth every 6 (six) hours as needed. 12/29/13   Loren Raceravid Seyed Heffley, MD   BP 124/69 mmHg  Pulse 68  Temp(Src) 97.9 F (36.6 C)  Resp 18  Ht 5\' 2"  (1.575 m)  Wt 171 lb (77.565 kg)  BMI 31.27 kg/m2  SpO2 98%  LMP 11/17/2013 Physical Exam  Constitutional: She is oriented to person, place, and time. She appears  well-developed and well-nourished. No distress.  HENT:  Head: Normocephalic and atraumatic.  Mouth/Throat: Oropharynx is clear and moist.  Eyes: EOM are normal. Pupils are equal, round, and reactive to light.  Neck: Normal range of motion. Neck supple.  Cardiovascular: Normal rate and regular rhythm.   Pulmonary/Chest: Effort normal and breath sounds normal. No respiratory distress. She has no wheezes. She has no rales.  Abdominal: Soft. Bowel sounds are normal. She exhibits no distension and no mass. There is no tenderness. There is no rebound and no guarding.  Musculoskeletal: Normal range of motion. She exhibits tenderness. She exhibits no edema.  Patient with right paraspinal and right flank tenderness with percussion. No midline tenderness  Neurological: She is alert and oriented to person, place, and time.  Patient is alert and oriented x3 with clear, goal oriented speech. Patient has 5/5 motor in all extremities. Sensation is intact to light touch. Bilateral finger-to-nose is normal with no signs of dysmetria. Patient has a normal gait and walks without assistance.  Skin: Skin is warm and dry. No rash noted. No erythema.  Psychiatric: She has a normal mood and affect. Her behavior is normal.  Nursing note and vitals reviewed.   ED Course  Procedures (including critical care time) Labs Review Labs Reviewed  URINALYSIS, ROUTINE W REFLEX MICROSCOPIC - Abnormal; Notable for the following:    APPearance CLOUDY (*)    Leukocytes, UA SMALL (*)    All other components within normal limits  URINE MICROSCOPIC-ADD ON - Abnormal; Notable for the following:    Squamous Epithelial / LPF MANY (*)    Bacteria, UA MANY (*)    All other components within normal limits  POC URINE PREG, ED    Imaging Review No results found.   EKG Interpretation None      MDM   Final diagnoses:  Pyelonephritis      I personally performed the services described in this documentation, which was  scribed in my presence. The recorded information has been reviewed and is accurate.   Patient with right flank and lumbar pain. Also appears to have a urinary tract infection. Possible early pyelonephritis. Vital signs stable. Given dose of IV antibiotics in the emergency department visit home with course of Cipro. Return precautions have been given.  Loren Raceravid Terri Malerba, MD 12/29/13 [phone]

## 2013-12-29 NOTE — ED Notes (Signed)
Ambulatory to restroom

## 2014-02-11 ENCOUNTER — Encounter (HOSPITAL_COMMUNITY): Payer: Self-pay | Admitting: Emergency Medicine

## 2014-02-11 ENCOUNTER — Emergency Department (HOSPITAL_COMMUNITY): Payer: Medicaid Other

## 2014-02-11 ENCOUNTER — Emergency Department (HOSPITAL_COMMUNITY)
Admission: EM | Admit: 2014-02-11 | Discharge: 2014-02-11 | Disposition: A | Discharge status: Home / Self Care | Payer: Self-pay | Attending: Emergency Medicine | Admitting: Emergency Medicine

## 2014-02-11 ENCOUNTER — Emergency Department (HOSPITAL_COMMUNITY): Payer: Self-pay

## 2014-02-11 DIAGNOSIS — R112 Nausea with vomiting, unspecified: Secondary | ICD-10-CM | POA: Insufficient documentation

## 2014-02-11 DIAGNOSIS — R1011 Right upper quadrant pain: Secondary | ICD-10-CM | POA: Insufficient documentation

## 2014-02-11 DIAGNOSIS — R109 Unspecified abdominal pain: Secondary | ICD-10-CM

## 2014-02-11 DIAGNOSIS — J45909 Unspecified asthma, uncomplicated: Secondary | ICD-10-CM | POA: Insufficient documentation

## 2014-02-11 DIAGNOSIS — Z3202 Encounter for pregnancy test, result negative: Secondary | ICD-10-CM | POA: Insufficient documentation

## 2014-02-11 DIAGNOSIS — M419 Scoliosis, unspecified: Secondary | ICD-10-CM | POA: Insufficient documentation

## 2014-02-11 HISTORY — DX: Unspecified asthma, uncomplicated: J45.909

## 2014-02-11 LAB — COMPREHENSIVE METABOLIC PANEL
ALT: 50 U/L — ABNORMAL HIGH (ref 0–35)
ANION GAP: 6 (ref 5–15)
AST: 52 U/L — ABNORMAL HIGH (ref 0–37)
Albumin: 3.3 g/dL — ABNORMAL LOW (ref 3.5–5.2)
Alkaline Phosphatase: 80 U/L (ref 39–117)
BUN: 10 mg/dL (ref 6–23)
CO2: 24 mmol/L (ref 19–32)
Calcium: 8.9 mg/dL (ref 8.4–10.5)
Chloride: 106 mmol/L (ref 96–112)
Creatinine, Ser: 0.75 mg/dL (ref 0.50–1.10)
GFR calc non Af Amer: >90 mL/min (ref >90)
GLUCOSE: 108 mg/dL — AB (ref 70–99)
Potassium: 3.6 mmol/L (ref 3.5–5.1)
Sodium: 136 mmol/L (ref 135–145)
Total Bilirubin: 0.3 mg/dL (ref 0.3–1.2)
Total Protein: 6.7 g/dL (ref 6.0–8.3)

## 2014-02-11 LAB — URINE MICROSCOPIC-ADD ON

## 2014-02-11 LAB — LIPASE, BLOOD: Lipase: 44 U/L (ref 11–59)

## 2014-02-11 LAB — CBC WITH DIFFERENTIAL/PLATELET
BASOS ABS: 0 10*3/uL (ref 0.0–0.1)
BASOS PCT: 0 % (ref 0–1)
EOS ABS: 0.4 10*3/uL (ref 0.0–0.7)
Eosinophils Relative: 5 % (ref 0–5)
HCT: 38.1 % (ref 36.0–46.0)
Hemoglobin: 12.6 g/dL (ref 12.0–15.0)
Lymphocytes Relative: 31 % (ref 12–46)
Lymphs Abs: 2.6 10*3/uL (ref 0.7–4.0)
MCH: 30.1 pg (ref 26.0–34.0)
MCHC: 33.1 g/dL (ref 30.0–36.0)
MCV: 91.1 fL (ref 78.0–100.0)
Monocytes Absolute: 0.6 10*3/uL (ref 0.1–1.0)
Monocytes Relative: 7 % (ref 3–12)
Neutro Abs: 4.8 10*3/uL (ref 1.7–7.7)
Neutrophils Relative %: 57 % (ref 43–77)
Platelets: 261 10*3/uL (ref 150–400)
RBC: 4.18 MIL/uL (ref 3.87–5.11)
RDW: 13 % (ref 11.5–15.5)
WBC: 8.4 10*3/uL (ref 4.0–10.5)

## 2014-02-11 LAB — URINALYSIS, ROUTINE W REFLEX MICROSCOPIC
Bilirubin Urine: NEGATIVE
Glucose, UA: NEGATIVE mg/dL
Ketones, ur: NEGATIVE mg/dL
Nitrite: NEGATIVE
Protein, ur: NEGATIVE mg/dL
Specific Gravity, Urine: 1.007 (ref 1.005–1.030)
UROBILINOGEN UA: 0.2 mg/dL (ref 0.0–1.0)
pH: 6.5 (ref 5.0–8.0)

## 2014-02-11 LAB — PREGNANCY, URINE: Preg Test, Ur: NEGATIVE

## 2014-02-11 MED ORDER — ONDANSETRON 8 MG PO TBDP: 8.0000 mg | ORAL_TABLET | Freq: Three times a day (TID) | ORAL | Status: DC | PRN

## 2014-02-11 MED ORDER — ONDANSETRON HCL 4 MG/2ML IJ SOLN
4.0000 mg | Freq: Once | INTRAMUSCULAR | Status: AC
  Administered 2014-02-11: 4 mg via INTRAVENOUS
  Filled 2014-02-11: qty 2

## 2014-02-11 MED ORDER — IOHEXOL 300 MG/ML  SOLN
80.0000 mL | Freq: Once | INTRAMUSCULAR | Status: AC | PRN
  Administered 2014-02-11: 80 mL via INTRAVENOUS

## 2014-02-11 MED ORDER — HYDROCODONE-ACETAMINOPHEN 5-325 MG PO TABS: 1.0000 | ORAL_TABLET | ORAL | Status: DC | PRN

## 2014-02-11 MED ORDER — MORPHINE SULFATE 4 MG/ML IJ SOLN
6.0000 mg | Freq: Once | INTRAMUSCULAR | Status: AC
  Administered 2014-02-11: 6 mg via INTRAVENOUS
  Filled 2014-02-11: qty 2

## 2014-02-11 MED ORDER — MORPHINE SULFATE 4 MG/ML IJ SOLN
4.0000 mg | Freq: Once | INTRAMUSCULAR | Status: AC
  Administered 2014-02-11: 4 mg via INTRAVENOUS
  Filled 2014-02-11: qty 1

## 2014-02-11 MED ORDER — IOHEXOL 300 MG/ML  SOLN: 25.0000 mL | Freq: Once | INTRAMUSCULAR | Status: DC | PRN

## 2014-02-11 NOTE — ED Notes (Signed)
Pt. reports RUQ pain and mid back pain with nausea and vomitting onset this evening , no fever or chills.

## 2014-02-11 NOTE — ED Provider Notes (Signed)
CSN: [id number]     Arrival date & time 02/11/14  0039 History  This chart was scribed for [name], MD by Richarda Overlie, ED Scribe. This patient was seen in room A03C/A03C and the patient's care was started 2:48 AM.      Chief Complaint  Patient presents with  . Abdominal Pain   The history is provided by the patient. No language interpreter was used.   HPI Comments: Sierra Evans is a 24 y.o. female who presents to the Emergency Department complaining of right upper abdominal pain that started last night at 10PM suddenly. She says her pain radiates around towards her back. Pt reports associated nausea and says that she vomited 1 time. She reports no similar prior episodes. Pt reports that her periods have been normal recently. She denies fever, chills, dysuria and pain with urination. Pt reports no pertinent past medical history at this time.    Past Medical History  Diagnosis Date  . Scoliosis   . Asthma    History reviewed. No pertinent past surgical history. Family History  Problem Relation Age of Onset  . Alcohol abuse Mother   . Cirrhosis Mother   . COPD Father   . Cancer Brother   . Cancer Maternal Grandfather   . Stroke Maternal Grandfather   . Hypertension Maternal Grandfather   . Cancer Paternal Grandmother    History  Substance Use Topics  . Smoking status: Never Smoker   . Smokeless tobacco: Never Used  . Alcohol Use: No   OB History    Gravida Para Term Preterm AB TAB SAB Ectopic Multiple Living   Review of Systems  A complete 10 system review of systems was obtained and all systems are negative except as noted in the HPI and PMH.    Allergies  Strawberry  Home Medications   Prior to Admission medications   Medication Sig Start Date End Date Taking? Authorizing Provider  acetaminophen (TYLENOL) 325 MG tablet Take 650 mg by mouth every 6 (six) hours as needed for mild pain, fever or headache.    Historical Provider, MD   albuterol (PROVENTIL HFA;VENTOLIN HFA) 108 (90 BASE) MCG/ACT inhaler Inhale 1 puff into the lungs every 6 (six) hours as needed for wheezing or shortness of breath.    Historical Provider, MD  ciprofloxacin (CIPRO) 500 MG tablet Take 1 tablet (500 mg total) by mouth 2 (two) times daily. One po bid x 7 days 12/29/13   Loren Racer, MD  cyclobenzaprine (FLEXERIL) 10 MG tablet Take 1 tablet (10 mg total) by mouth 2 (two) times daily as needed for muscle spasms. 10/10/13   Vida Roller, MD  ibuprofen (ADVIL,MOTRIN) 600 MG tablet Take 1 tablet (600 mg total) by mouth every 6 (six) hours as needed. 12/29/13   Loren Racer, MD  naproxen (NAPROSYN) 500 MG tablet Take 500 mg by mouth 2 (two) times daily as needed for mild pain, moderate pain or headache.    Historical Provider, MD  naproxen (NAPROSYN) 500 MG tablet Take 1 tablet (500 mg total) by mouth 2 (two) times daily with a meal. Patient taking differently: Take 500 mg by mouth 2 (two) times daily as needed for mild pain.  10/10/13   Vida Roller, MD  traMADol (ULTRAM) 50 MG tablet Take 1 tablet (50 mg total) by mouth every 6 (six) hours as needed. 12/29/13   Loren Racer, MD  BP 138/71 mmHg  Pulse 84  Temp(Src) 98.6 F (37 C) (Oral)  Resp 14  Ht 5\' 2"  (1.575 m)  Wt 175 lb (79.379 kg)  BMI 32.00 kg/m2  SpO2 98%  LMP 12/15/2013 Physical Exam  Constitutional: She is oriented to person, place, and time. She appears well-developed and well-nourished. No distress.  HENT:  Head: Normocephalic and atraumatic.  Eyes: EOM are normal.  Neck: Normal range of motion.  Cardiovascular: Normal rate, regular rhythm and normal heart sounds.   Pulmonary/Chest: Effort normal and breath sounds normal.  Abdominal: Soft. She exhibits no distension. There is tenderness.  Mild epigastric and RUQ tenderness.  Musculoskeletal: Normal range of motion.  Neurological: She is alert and oriented to person, place, and time.  Skin: Skin is warm and dry.   Psychiatric: She has a normal mood and affect. Judgment normal.  Nursing note and vitals reviewed.   ED Course  Procedures   DIAGNOSTIC STUDIES: Oxygen Saturation is 98% on RA, normal by my interpretation.    COORDINATION OF CARE: 2:51 AM Discussed treatment plan with pt at bedside and pt agreed to plan.   Labs Review Labs Reviewed  COMPREHENSIVE METABOLIC PANEL - Abnormal; Notable for the following:    Glucose, Bld 108 (*)    Albumin 3.3 (*)    AST 52 (*)    ALT 50 (*)    All other components within normal limits  URINALYSIS, ROUTINE W REFLEX MICROSCOPIC - Abnormal; Notable for the following:    Color, Urine STRAW (*)    Hgb urine dipstick TRACE (*)    Leukocytes, UA SMALL (*)    All other components within normal limits  URINE MICROSCOPIC-ADD ON - Abnormal; Notable for the following:    Squamous Epithelial / LPF FEW (*)    Bacteria, UA FEW (*)    All other components within normal limits  CBC WITH DIFFERENTIAL/PLATELET  LIPASE, BLOOD  PREGNANCY, URINE    Imaging Review Ct Abdomen Pelvis W Contrast  02/11/2014   CLINICAL DATA:  24 year old female with sudden onset right upper quadrant pain radiating to the back with nausea and vomiting, beginning at 2200 hrs. Initial encounter.  EXAM: CT ABDOMEN AND PELVIS WITH CONTRAST  TECHNIQUE: Multidetector CT imaging of the abdomen and pelvis was performed using the standard protocol following bolus administration of intravenous contrast.  CONTRAST:  80mL OMNIPAQUE IOHEXOL 300 MG/ML  SOLN  COMPARISON:  Ob ultrasound 07/04/2013.  FINDINGS: Mild dependent atelectasis at the lung bases. No pericardial or pleural effusion.  No osseous abnormality identified.  Trace if any pelvic free fluid. Uterus and adnexa are within normal limits. Decompressed distal colon. Unremarkable bladder.  Redundant sigmoid colon. Retained stool in the more proximal colon. No inflammation of the left colon, transverse colon. Redundant hepatic flexure and right  colon. The cecum is on a lax mesentery and located in the midline of the central abdomen just above the level of the umbilicus. The ileocecal valve is visible on coronal image 61. Oral contrast has not yet reached the terminal ileum. The appendix is not identified, but there is no pericecal inflammation. Mildly increased small bowel mesenteric nodes, including pericecal nodes. No mesenteric stranding. No dilated small bowel.  The stomach is mildly distended with contrast. The duodenum is decompressed. The duodenum has a normal retropharyngeal course. Mildly decreased density throughout the liver which otherwise enhances normally. Gallbladder, spleen, pancreas, adrenal glands, portal venous system, and major arterial structures in the abdomen and pelvis are patent. Both kidneys enhance  normally. No hydronephrosis, hydroureter, or perinephric stranding. No lymphadenopathy. No abdominal free fluid. No pneumoperitoneum.  IMPRESSION: 1. No acute or inflammatory process identified in the abdomen or pelvis. 2. Redundant colon and midline positioning of the cecum. Appendix not delineated, but there is no pericecal inflammation.   Electronically Signed   By: Augusto Gamble M.D.   On: 02/11/2014 07:10   US Abdomen Limited Ruq  02/11/2014   CLINICAL DATA:  Right upper quadrant abdominal pain.  EXAM: US ABDOMEN LIMITED - RIGHT UPPER QUADRANT  COMPARISON:  None.  FINDINGS: Gallbladder:  No gallstones or wall thickening visualized. No sonographic Murphy sign noted.  Common bile duct:  Diameter: 3.7 mm, normal  Liver:  No focal lesion identified. Within normal limits in parenchymal echogenicity.  Images are also obtained specifically in the area of pain in the epigastric region. No focal abnormality demonstrated.  IMPRESSION: Normal examination.   Electronically Signed   By: Burman Nieves M.D.   On: 02/11/2014 03:40  I personally reviewed the imaging tests through PACS system I reviewed available ER/hospitalization records  through the EMR    EKG Interpretation None      MDM   Final diagnoses:  Abdominal pain, unspecified abdominal location   7:37 AM Patient feels better at this time.  CT SCAN WITHOUT SIGNIFICANT ABNORMALITY.  DISCHARGE HOME IN GOOD CONDITION.  OUTPATIENT GI FOLLOW-UP AS WELL AS PRIMARY CARE FOLLOW-UP.  HOME WITH PAIN MEDICINE NAUSEA MEDICINE.  SHE UNDERSTANDS TO RETURN TO THE ER FOR NEW OR WORSENING SYMPTOMS.  I personally performed the services described in this documentation, which was scribed in my presence. The recorded information has been reviewed and is accurate.        [name], MD 02/11/14 [phone]

## 2014-02-25 ENCOUNTER — Encounter (HOSPITAL_COMMUNITY): Payer: Self-pay | Admitting: *Deleted

## 2014-02-25 ENCOUNTER — Emergency Department (HOSPITAL_COMMUNITY)
Admission: EM | Admit: 2014-02-25 | Discharge: 2014-02-26 | Disposition: A | Discharge status: Home / Self Care | Payer: Medicaid Other | Attending: Emergency Medicine | Admitting: Emergency Medicine

## 2014-02-25 DIAGNOSIS — J45909 Unspecified asthma, uncomplicated: Secondary | ICD-10-CM | POA: Insufficient documentation

## 2014-02-25 DIAGNOSIS — R112 Nausea with vomiting, unspecified: Secondary | ICD-10-CM | POA: Insufficient documentation

## 2014-02-25 DIAGNOSIS — Z79899 Other long term (current) drug therapy: Secondary | ICD-10-CM | POA: Insufficient documentation

## 2014-02-25 DIAGNOSIS — Z8739 Personal history of other diseases of the musculoskeletal system and connective tissue: Secondary | ICD-10-CM | POA: Insufficient documentation

## 2014-02-25 DIAGNOSIS — R1012 Left upper quadrant pain: Secondary | ICD-10-CM | POA: Insufficient documentation

## 2014-02-25 DIAGNOSIS — Z3202 Encounter for pregnancy test, result negative: Secondary | ICD-10-CM | POA: Insufficient documentation

## 2014-02-25 DIAGNOSIS — R1013 Epigastric pain: Secondary | ICD-10-CM

## 2014-02-25 NOTE — ED Notes (Signed)
Pt reports L abd pain with n/v since yesterday.  Pt reports unable to drink or eat d/t nausea.  Vomited x 3 today.  Pt reports her stomach feels warm and feels shaky.

## 2014-02-26 LAB — URINALYSIS, ROUTINE W REFLEX MICROSCOPIC
Bilirubin Urine: NEGATIVE
Glucose, UA: NEGATIVE mg/dL
HGB URINE DIPSTICK: NEGATIVE
Ketones, ur: NEGATIVE mg/dL
NITRITE: NEGATIVE
Protein, ur: NEGATIVE mg/dL
SPECIFIC GRAVITY, URINE: 1.026 (ref 1.005–1.030)
Urobilinogen, UA: 0.2 mg/dL (ref 0.0–1.0)
pH: 6.5 (ref 5.0–8.0)

## 2014-02-26 LAB — COMPREHENSIVE METABOLIC PANEL
ALBUMIN: 3.6 g/dL (ref 3.5–5.2)
ALT: 16 U/L (ref 0–35)
AST: 19 U/L (ref 0–37)
Alkaline Phosphatase: 75 U/L (ref 39–117)
Anion gap: 8 (ref 5–15)
BILIRUBIN TOTAL: 0.6 mg/dL (ref 0.3–1.2)
BUN: 18 mg/dL (ref 6–23)
CHLORIDE: 107 mmol/L (ref 96–112)
CO2: 22 mmol/L (ref 19–32)
Calcium: 8.9 mg/dL (ref 8.4–10.5)
Creatinine, Ser: 0.69 mg/dL (ref 0.50–1.10)
GFR calc Af Amer: >90 mL/min (ref >90)
GFR calc non Af Amer: >90 mL/min (ref >90)
Glucose, Bld: 114 mg/dL — ABNORMAL HIGH (ref 70–99)
POTASSIUM: 3.9 mmol/L (ref 3.5–5.1)
SODIUM: 137 mmol/L (ref 135–145)
Total Protein: 6.7 g/dL (ref 6.0–8.3)

## 2014-02-26 LAB — CBC WITH DIFFERENTIAL/PLATELET
BASOS ABS: 0 10*3/uL (ref 0.0–0.1)
Basophils Relative: 0 % (ref 0–1)
EOS ABS: 0.4 10*3/uL (ref 0.0–0.7)
Eosinophils Relative: 4 % (ref 0–5)
HEMATOCRIT: 44.9 % (ref 36.0–46.0)
Hemoglobin: 14.8 g/dL (ref 12.0–15.0)
Lymphocytes Relative: 44 % (ref 12–46)
Lymphs Abs: 5 10*3/uL — ABNORMAL HIGH (ref 0.7–4.0)
MCH: 30.3 pg (ref 26.0–34.0)
MCHC: 33 g/dL (ref 30.0–36.0)
MCV: 91.8 fL (ref 78.0–100.0)
MONO ABS: 0.8 10*3/uL (ref 0.1–1.0)
Monocytes Relative: 7 % (ref 3–12)
Neutro Abs: 5.1 10*3/uL (ref 1.7–7.7)
Neutrophils Relative %: 45 % (ref 43–77)
PLATELETS: 386 10*3/uL (ref 150–400)
RBC: 4.89 MIL/uL (ref 3.87–5.11)
RDW: 12.9 % (ref 11.5–15.5)
WBC: 11.3 10*3/uL — ABNORMAL HIGH (ref 4.0–10.5)

## 2014-02-26 LAB — PREGNANCY, URINE: Preg Test, Ur: NEGATIVE

## 2014-02-26 LAB — URINE MICROSCOPIC-ADD ON

## 2014-02-26 LAB — LIPASE, BLOOD: LIPASE: 23 U/L (ref 11–59)

## 2014-02-26 MED ORDER — ONDANSETRON 4 MG PO TBDP: 4.0000 mg | ORAL_TABLET | Freq: Three times a day (TID) | ORAL | Status: DC | PRN

## 2014-02-26 MED ORDER — GI COCKTAIL ~~LOC~~
30.0000 mL | Freq: Once | ORAL | Status: AC
  Administered 2014-02-26: 30 mL via ORAL
  Filled 2014-02-26: qty 30

## 2014-02-26 MED ORDER — LANSOPRAZOLE 30 MG PO CPDR: 30.0000 mg | DELAYED_RELEASE_CAPSULE | Freq: Every day | ORAL | Status: DC

## 2014-02-26 MED ORDER — ONDANSETRON 4 MG PO TBDP
4.0000 mg | ORAL_TABLET | Freq: Once | ORAL | Status: AC
  Administered 2014-02-26: 4 mg via ORAL
  Filled 2014-02-26: qty 1

## 2014-02-26 MED ORDER — DICYCLOMINE HCL 20 MG PO TABS: 20.0000 mg | ORAL_TABLET | Freq: Two times a day (BID) | ORAL | Status: DC

## 2014-02-26 MED ORDER — POLYETHYLENE GLYCOL 3350 17 G PO PACK: 17.0000 g | PACK | Freq: Every day | ORAL | Status: DC

## 2014-02-26 NOTE — ED Provider Notes (Signed)
CSN: [id number]     Arrival date & time 02/25/14  2249 History   First MD Initiated Contact with Patient 02/26/14 0000     Chief Complaint  Patient presents with  . Abdominal Pain     (Consider location/radiation/quality/duration/timing/severity/associated sxs/prior Treatment) HPI Patient seen on 02/11/14 for abdominal pain and had CT and ultrasound performed at that time. Patient states pain improved. She presents now with several days of upper abdominal pain. States the pain is intermittent and colicky in nature. Patient also had nausea associated with it. She states she's had 3 episodes of vomiting today. No blood in vomit. Had a normal bowel movement today as well. No fever or chills. No sick contacts. Denies any dysuria or flank pain. Patient states she is taking no medication at home. Occasional NSAID use. Past Medical History  Diagnosis Date  . Scoliosis   . Asthma    History reviewed. No pertinent past surgical history. Family History  Problem Relation Age of Onset  . Alcohol abuse Mother   . Cirrhosis Mother   . COPD Father   . Cancer Brother   . Cancer Maternal Grandfather   . Stroke Maternal Grandfather   . Hypertension Maternal Grandfather   . Cancer Paternal Grandmother    History  Substance Use Topics  . Smoking status: Never Smoker   . Smokeless tobacco: Never Used  . Alcohol Use: No   OB History    Gravida Para Term Preterm AB TAB SAB Ectopic Multiple Living   Review of Systems  Constitutional: Negative for fever and chills.  Respiratory: Negative for cough and shortness of breath.   Cardiovascular: Negative for chest pain.  Gastrointestinal: Positive for nausea, vomiting and abdominal pain. Negative for diarrhea, constipation and blood in stool.  Genitourinary: Negative for dysuria, frequency and flank pain.  Musculoskeletal: Negative for back pain, neck pain and neck stiffness.  Skin: Negative for rash and wound.  Neurological:  Negative for dizziness, weakness, light-headedness, numbness and headaches.  All other systems reviewed and are negative.     Allergies  Strawberry  Home Medications   Prior to Admission medications   Medication Sig Start Date End Date Taking? Authorizing Provider  acetaminophen (TYLENOL) 325 MG tablet Take 650 mg by mouth every 6 (six) hours as needed for mild pain, fever or headache.   Yes Historical Provider, MD  albuterol (PROVENTIL HFA;VENTOLIN HFA) 108 (90 BASE) MCG/ACT inhaler Inhale 1 puff into the lungs every 6 (six) hours as needed for wheezing or shortness of breath.   Yes Historical Provider, MD  ciprofloxacin (CIPRO) 500 MG tablet Take 1 tablet (500 mg total) by mouth 2 (two) times daily. One po bid x 7 days Patient not taking: Reported on 02/11/2014 12/29/13   Loren Racer, MD  cyclobenzaprine (FLEXERIL) 10 MG tablet Take 1 tablet (10 mg total) by mouth 2 (two) times daily as needed for muscle spasms. Patient not taking: Reported on 02/25/2014 10/10/13   Vida Roller, MD  dicyclomine (BENTYL) 20 MG tablet Take 1 tablet (20 mg total) by mouth 2 (two) times daily. 02/26/14   Loren Racer, MD  HYDROcodone-acetaminophen (NORCO/VICODIN) 5-325 MG per tablet Take 1 tablet by mouth every 4 (four) hours as needed for moderate pain. Patient not taking: Reported on 02/25/2014 02/11/14   [name], MD  lansoprazole (PREVACID) 30 MG capsule Take 1 capsule (30 mg total) by mouth daily at 12 noon.  02/26/14   Loren Raceravid Merna Baldi, MD  ondansetron (ZOFRAN-ODT) 4 MG disintegrating tablet Take 1 tablet (4 mg total) by mouth every 8 (eight) hours as needed for nausea or vomiting. 02/26/14   Loren Raceravid Timothy Townsel, MD  polyethylene glycol (MIRALAX / Ethelene HalGLYCOLAX) packet Take 17 g by mouth daily. 02/26/14   Loren Raceravid Kesean Serviss, MD  traMADol (ULTRAM) 50 MG tablet Take 1 tablet (50 mg total) by mouth every 6 (six) hours as needed. Patient not taking: Reported on 02/25/2014 12/29/13   Loren Raceravid Darleene Cumpian, MD   BP 116/72  mmHg  Pulse 65  Temp(Src) 97.7 F (36.5 C) (Oral)  Resp 20  Ht 5\' 2"  (1.575 m)  Wt 178 lb (80.74 kg)  BMI 32.55 kg/m2  SpO2 100%  LMP 02/15/2014 Physical Exam  Constitutional: She is oriented to person, place, and time. She appears well-developed and well-nourished. No distress.  HENT:  Head: Normocephalic and atraumatic.  Mouth/Throat: Oropharynx is clear and moist.  Eyes: EOM are normal. Pupils are equal, round, and reactive to light.  Neck: Normal range of motion. Neck supple.  Cardiovascular: Normal rate and regular rhythm.   Pulmonary/Chest: Effort normal and breath sounds normal. No respiratory distress. She has no wheezes. She has no rales. She exhibits no tenderness.  Abdominal: Soft. Bowel sounds are normal. She exhibits no distension and no mass. There is tenderness (mild tenderness to palpation in the left upper quadrant and epigastric region.). There is no rebound and no guarding.  Musculoskeletal: Normal range of motion. She exhibits no edema or tenderness.  No CVA tenderness bilaterally.  Neurological: She is alert and oriented to person, place, and time.  Skin: Skin is warm and dry. No rash noted. No erythema.  Psychiatric: She has a normal mood and affect. Her behavior is normal.  Nursing note and vitals reviewed.   ED Course  Procedures (including critical care time) Labs Review Labs Reviewed  CBC WITH DIFFERENTIAL/PLATELET - Abnormal; Notable for the following:    WBC 11.3 (*)    Lymphs Abs 5.0 (*)    All other components within normal limits  COMPREHENSIVE METABOLIC PANEL - Abnormal; Notable for the following:    Glucose, Bld 114 (*)    All other components within normal limits  URINALYSIS, ROUTINE W REFLEX MICROSCOPIC - Abnormal; Notable for the following:    APPearance CLOUDY (*)    Leukocytes, UA MODERATE (*)    All other components within normal limits  URINE MICROSCOPIC-ADD ON - Abnormal; Notable for the following:    Squamous Epithelial / LPF MANY  (*)    Bacteria, UA MANY (*)    All other components within normal limits  LIPASE, BLOOD  PREGNANCY, URINE    Imaging Review No results found.   EKG Interpretation None      MDM   Final diagnoses:  Epigastric pain    Patient is resting comfortably. States abdominal pain is improved after GI cocktail and Zofran. Reviewed patient's recent CT scan. Showed moderate to large amount of stool in the proximal colon. Do not think that repeat CT scan is necessary at this point. We'll start on PPI and also recommend MiraLAX. Advised stopping all NSAIDs. Patient has been given return precautions and is voiced understanding.    Loren Raceravid Fletcher Ostermiller, MD 02/26/14 [id number]

## 2014-02-26 NOTE — Discharge Instructions (Signed)
Take medications as prescribed. Stop using naproxen, ibuprofen and all other NSAIDs. Return immediately for worsening pain, persistent vomiting, fever or for any concerns.  Abdominal Pain Many things can cause abdominal pain. Usually, abdominal pain is not caused by a disease and will improve without treatment. It can often be observed and treated at home. Your health care provider will do a physical exam and possibly order blood tests and X-rays to help determine the seriousness of your pain. However, in many cases, more time must pass before a clear cause of the pain can be found. Before that point, your health care provider may not know if you need more testing or further treatment. HOME CARE INSTRUCTIONS  Monitor your abdominal pain for any changes. The following actions may help to alleviate any discomfort you are experiencing:  Only take over-the-counter or prescription medicines as directed by your health care provider.  Do not take laxatives unless directed to do so by your health care provider.  Try a clear liquid diet (broth, tea, or water) as directed by your health care provider. Slowly move to a bland diet as tolerated. SEEK MEDICAL CARE IF:  You have unexplained abdominal pain.  You have abdominal pain associated with nausea or diarrhea.  You have pain when you urinate or have a bowel movement.  You experience abdominal pain that wakes you in the night.  You have abdominal pain that is worsened or improved by eating food.  You have abdominal pain that is worsened with eating fatty foods.  You have a fever. SEEK IMMEDIATE MEDICAL CARE IF:   Your pain does not go away within 2 hours.  You keep throwing up (vomiting).  Your pain is felt only in portions of the abdomen, such as the right side or the left lower portion of the abdomen.  You pass bloody or black tarry stools. MAKE SURE YOU:  Understand these instructions.   Will watch your condition.   Will get help  right away if you are not doing well or get worse.  Document Released: 10/12/2004 Document Revised: 01/07/2013 Document Reviewed: 09/11/2012 [facility] Patient Information 2015 [place], [place]. This information is not intended to replace advice given to you by your health care provider. Make sure you discuss any questions you have with your health care provider. Constipation Constipation is when a person has fewer than three bowel movements a week, has difficulty having a bowel movement, or has stools that are dry, hard, or larger than normal. As people grow older, constipation is more common. If you try to fix constipation with medicines that make you have a bowel movement (laxatives), the problem may get worse. Long-term laxative use may cause the muscles of the colon to become weak. A low-fiber diet, not taking in enough fluids, and taking certain medicines may make constipation worse.  CAUSES   Certain medicines, such as antidepressants, pain medicine, iron supplements, antacids, and water pills.   Certain diseases, such as diabetes, irritable bowel syndrome (IBS), thyroid disease, or depression.   Not drinking enough water.   Not eating enough fiber-rich foods.   Stress or travel.   Lack of physical activity or exercise.   Ignoring the urge to have a bowel movement.   Using laxatives too much.  SIGNS AND SYMPTOMS   Having fewer than three bowel movements a week.   Straining to have a bowel movement.   Having stools that are hard, dry, or larger than normal.   Feeling full or bloated.  Pain in the lower abdomen.   Not feeling relief after having a bowel movement.  DIAGNOSIS  Your health care provider will take a medical history and perform a physical exam. Further testing may be done for severe constipation. Some tests may include:  A barium enema X-ray to examine your rectum, colon, and, sometimes, your small intestine.   A sigmoidoscopy to examine your  lower colon.   A colonoscopy to examine your entire colon. TREATMENT  Treatment will depend on the severity of your constipation and what is causing it. Some dietary treatments include drinking more fluids and eating more fiber-rich foods. Lifestyle treatments may include regular exercise. If these diet and lifestyle recommendations do not help, your health care provider may recommend taking over-the-counter laxative medicines to help you have bowel movements. Prescription medicines may be prescribed if over-the-counter medicines do not work.  HOME CARE INSTRUCTIONS   Eat foods that have a lot of fiber, such as fruits, vegetables, whole grains, and beans.  Limit foods high in fat and processed sugars, such as french fries, hamburgers, cookies, candies, and soda.   A fiber supplement may be added to your diet if you cannot get enough fiber from foods.   Drink enough fluids to keep your urine clear or pale yellow.   Exercise regularly or as directed by your health care provider.   Go to the restroom when you have the urge to go. Do not hold it.   Only take over-the-counter or prescription medicines as directed by your health care provider. Do not take other medicines for constipation without talking to your health care provider first.  SEEK IMMEDIATE MEDICAL CARE IF:   You have bright red blood in your stool.   Your constipation lasts for more than 4 days or gets worse.   You have abdominal or rectal pain.   You have thin, pencil-like stools.   You have unexplained weight loss. MAKE SURE YOU:   Understand these instructions.  Will watch your condition.  Will get help right away if you are not doing well or get worse. Document Released: 10/01/2003 Document Revised: 01/07/2013 Document Reviewed: 10/14/2012 [facility] Patient Information 2015 [place], [place]. This information is not intended to replace advice given to you by your health care provider. Make sure you discuss  any questions you have with your health care provider. Gastritis, Adult Gastritis is soreness and swelling (inflammation) of the lining of the stomach. Gastritis can develop as a sudden onset (acute) or long-term (chronic) condition. If gastritis is not treated, it can lead to stomach bleeding and ulcers. CAUSES  Gastritis occurs when the stomach lining is weak or damaged. Digestive juices from the stomach then inflame the weakened stomach lining. The stomach lining may be weak or damaged due to viral or bacterial infections. One common bacterial infection is the Helicobacter pylori infection. Gastritis can also result from excessive alcohol consumption, taking certain medicines, or having too much acid in the stomach.  SYMPTOMS  In some cases, there are no symptoms. When symptoms are present, they may include:  Pain or a burning sensation in the upper abdomen.  Nausea.  Vomiting.  An uncomfortable feeling of fullness after eating. DIAGNOSIS  Your caregiver may suspect you have gastritis based on your symptoms and a physical exam. To determine the cause of your gastritis, your caregiver may perform the following:  Blood or stool tests to check for the H pylori bacterium.  Gastroscopy. A thin, flexible tube (endoscope) is passed down the esophagus  and into the stomach. The endoscope has a light and camera on the end. Your caregiver uses the endoscope to view the inside of the stomach.  Taking a tissue sample (biopsy) from the stomach to examine under a microscope. TREATMENT  Depending on the cause of your gastritis, medicines may be prescribed. If you have a bacterial infection, such as an H pylori infection, antibiotics may be given. If your gastritis is caused by too much acid in the stomach, H2 blockers or antacids may be given. Your caregiver may recommend that you stop taking aspirin, ibuprofen, or other nonsteroidal anti-inflammatory drugs (NSAIDs). HOME CARE INSTRUCTIONS  Only take  over-the-counter or prescription medicines as directed by your caregiver.  If you were given antibiotic medicines, take them as directed. Finish them even if you start to feel better.  Drink enough fluids to keep your urine clear or pale yellow.  Avoid foods and drinks that make your symptoms worse, such as:  Caffeine or alcoholic drinks.  Chocolate.  Peppermint or mint flavorings.  Garlic and onions.  Spicy foods.  Citrus fruits, such as oranges, lemons, or limes.  Tomato-based foods such as sauce, chili, salsa, and pizza.  Fried and fatty foods.  Eat small, frequent meals instead of large meals. SEEK IMMEDIATE MEDICAL CARE IF:   You have black or dark red stools.  You vomit blood or material that looks like coffee grounds.  You are unable to keep fluids down.  Your abdominal pain gets worse.  You have a fever.  You do not feel better after 1 week.  You have any other questions or concerns. MAKE SURE YOU:  Understand these instructions.  Will watch your condition.  Will get help right away if you are not doing well or get worse. Document Released: 12/27/2000 Document Revised: 07/04/2011 Document Reviewed: 02/15/2011 [facility] Patient Information 2015 [place], [place]. This information is not intended to replace advice given to you by your health care provider. Make sure you discuss any questions you have with your health care provider.

## 2014-04-11 ENCOUNTER — Emergency Department (HOSPITAL_COMMUNITY): Payer: Medicaid Other

## 2014-04-11 ENCOUNTER — Encounter (HOSPITAL_COMMUNITY): Payer: Self-pay | Admitting: Emergency Medicine

## 2014-04-11 ENCOUNTER — Emergency Department (HOSPITAL_COMMUNITY)
Admission: EM | Admit: 2014-04-11 | Discharge: 2014-04-11 | Disposition: A | Discharge status: Home / Self Care | Payer: Medicaid Other | Attending: Emergency Medicine | Admitting: Emergency Medicine

## 2014-04-11 DIAGNOSIS — Y9389 Activity, other specified: Secondary | ICD-10-CM | POA: Diagnosis not present

## 2014-04-11 DIAGNOSIS — Y998 Other external cause status: Secondary | ICD-10-CM | POA: Diagnosis not present

## 2014-04-11 DIAGNOSIS — J45909 Unspecified asthma, uncomplicated: Secondary | ICD-10-CM | POA: Insufficient documentation

## 2014-04-11 DIAGNOSIS — W1839XA Other fall on same level, initial encounter: Secondary | ICD-10-CM | POA: Diagnosis not present

## 2014-04-11 DIAGNOSIS — S99911A Unspecified injury of right ankle, initial encounter: Secondary | ICD-10-CM | POA: Diagnosis not present

## 2014-04-11 DIAGNOSIS — Y9289 Other specified places as the place of occurrence of the external cause: Secondary | ICD-10-CM | POA: Insufficient documentation

## 2014-04-11 DIAGNOSIS — W19XXXA Unspecified fall, initial encounter: Secondary | ICD-10-CM

## 2014-04-11 DIAGNOSIS — S8991XA Unspecified injury of right lower leg, initial encounter: Secondary | ICD-10-CM | POA: Diagnosis present

## 2014-04-11 DIAGNOSIS — M25569 Pain in unspecified knee: Secondary | ICD-10-CM

## 2014-04-11 DIAGNOSIS — M25571 Pain in right ankle and joints of right foot: Secondary | ICD-10-CM

## 2014-04-11 DIAGNOSIS — Z79899 Other long term (current) drug therapy: Secondary | ICD-10-CM | POA: Diagnosis not present

## 2014-04-11 DIAGNOSIS — M419 Scoliosis, unspecified: Secondary | ICD-10-CM | POA: Diagnosis not present

## 2014-04-11 MED ORDER — IBUPROFEN 800 MG PO TABS: 800.0000 mg | ORAL_TABLET | Freq: Three times a day (TID) | ORAL | Status: DC | PRN

## 2014-04-11 MED ORDER — IBUPROFEN 800 MG PO TABS
800.0000 mg | ORAL_TABLET | Freq: Once | ORAL | Status: DC
  Filled 2014-04-11: qty 1

## 2014-04-11 NOTE — Discharge Instructions (Signed)
Arthralgia Ms. Zhan, use ice packs and take Motrin as needed for pain. See a primary care physician within 3 days for follow-up. His symptoms worsen come back to emergency department and medially. Thank you. Arthralgia is joint pain. A joint is a place where two bones meet. Joint pain can happen for many reasons. The joint can be bruised, stiff, infected, or weak from aging. Pain usually goes away after resting and taking medicine for soreness.  HOME CARE  Rest the joint as told by your doctor.  Keep the sore joint raised (elevated) for the first 24 hours.  Put ice on the joint area.  Put ice in a plastic bag.  Place a towel between your skin and the bag.  Leave the ice on for 15-20 minutes, 03-04 times a day.  Wear your splint, casting, elastic bandage, or sling as told by your doctor.  Only take medicine as told by your doctor. Do not take aspirin.  Use crutches as told by your doctor. Do not put weight on the joint until told to by your doctor. GET HELP RIGHT AWAY IF:   You have bruising, puffiness (swelling), or more pain.  Your fingers or toes turn blue or start to lose feeling (numb).  Your medicine does not lessen the pain.  Your pain becomes severe.  You have a temperature by mouth above 102 F (38.9 C), not controlled by medicine.  You cannot move or use the joint. MAKE SURE YOU:   Understand these instructions.  Will watch your condition.  Will get help right away if you are not doing well or get worse. Document Released: 12/21/2008 Document Revised: 03/27/2011 Document Reviewed: 12/21/2008 [facility] Patient Information 2015 [place], [place]. This information is not intended to replace advice given to you by your health care provider. Make sure you discuss any questions you have with your health care provider.

## 2014-04-11 NOTE — ED Notes (Signed)
Patient reports right leg pain starting at the right knee and radiating to the right foot. Denies hx blood clots/knee injury/leg trauma. Not on blood thinners. Took Ibuprofen earlier today with no alleviation of symptoms. Reports it is painful to put pressure on foot and leg. Limited ROM with right leg. No other c/c.

## 2014-04-11 NOTE — ED Notes (Signed)
Patient transported to X-ray 

## 2014-04-11 NOTE — ED Provider Notes (Signed)
CSN: [id number]     Arrival date & time 04/11/14  0144 History   First MD Initiated Contact with Patient 04/11/14 0321     Chief Complaint  Patient presents with  . Leg Pain     (Consider location/radiation/quality/duration/timing/severity/associated sxs/prior Treatment) HPI  Sierra Evans is a 24 y.o. female with past medical history of asthma presenting today with right knee and ankle pain. Patient states that she fell earlier today over a pallet of close and sensation is had pain in her knee and her ankle. She denies any direct trauma to the area. She tried Motrin without any relief. Movement makes the pain worse as well as putting pressure on her leg. Patient has no further complaints.  10 Systems reviewed and are negative for acute change except as noted in the HPI.    Past Medical History  Diagnosis Date  . Scoliosis   . Asthma    History reviewed. No pertinent past surgical history. Family History  Problem Relation Age of Onset  . Alcohol abuse Mother   . Cirrhosis Mother   . COPD Father   . Cancer Brother   . Cancer Maternal Grandfather   . Stroke Maternal Grandfather   . Hypertension Maternal Grandfather   . Cancer Paternal Grandmother    History  Substance Use Topics  . Smoking status: Never Smoker   . Smokeless tobacco: Never Used  . Alcohol Use: No   OB History    Gravida Para Term Preterm AB TAB SAB Ectopic Multiple Living   3 2 2       2      Review of Systems    Allergies  Strawberry  Home Medications   Prior to Admission medications   Medication Sig Start Date End Date Taking? Authorizing Provider  acetaminophen (TYLENOL) 325 MG tablet Take 650 mg by mouth every 6 (six) hours as needed for mild pain, fever or headache.   Yes Historical Provider, MD  albuterol (PROVENTIL HFA;VENTOLIN HFA) 108 (90 BASE) MCG/ACT inhaler Inhale 1 puff into the lungs every 6 (six) hours as needed for wheezing or shortness of breath.    Historical Provider, MD   ciprofloxacin (CIPRO) 500 MG tablet Take 1 tablet (500 mg total) by mouth 2 (two) times daily. One po bid x 7 days Patient not taking: Reported on 02/11/2014 12/29/13   Loren Raceravid Yelverton, MD  cyclobenzaprine (FLEXERIL) 10 MG tablet Take 1 tablet (10 mg total) by mouth 2 (two) times daily as needed for muscle spasms. Patient not taking: Reported on 02/25/2014 10/10/13   Eber HongBrian Miller, MD  dicyclomine (BENTYL) 20 MG tablet Take 1 tablet (20 mg total) by mouth 2 (two) times daily. Patient not taking: Reported on 04/11/2014 02/26/14   Loren Raceravid Yelverton, MD  HYDROcodone-acetaminophen (NORCO/VICODIN) 5-325 MG per tablet Take 1 tablet by mouth every 4 (four) hours as needed for moderate pain. Patient not taking: Reported on 02/25/2014 02/11/14   Azalia BilisKevin Campos, MD  lansoprazole (PREVACID) 30 MG capsule Take 1 capsule (30 mg total) by mouth daily at 12 noon. 02/26/14   Loren Raceravid Yelverton, MD  ondansetron (ZOFRAN-ODT) 4 MG disintegrating tablet Take 1 tablet (4 mg total) by mouth every 8 (eight) hours as needed for nausea or vomiting. 02/26/14   Loren Raceravid Yelverton, MD  polyethylene glycol (MIRALAX / Ethelene HalGLYCOLAX) packet Take 17 g by mouth daily. Patient taking differently: Take 17 g by mouth daily as needed for mild constipation.  02/26/14   Loren Raceravid Yelverton, MD  traMADol (ULTRAM) 50 MG tablet  Take 1 tablet (50 mg total) by mouth every 6 (six) hours as needed. Patient not taking: Reported on 02/25/2014 12/29/13   Loren Racer, MD   BP 102/84 mmHg  Pulse 71  Temp(Src) 97.8 F (36.6 C) (Oral)  Resp 20  Ht  (1.575 m)  Wt 185 lb (83.915 kg)  BMI 33.83 kg/m2  SpO2 99%  LMP 03/14/2014 (Exact Date)  Breastfeeding? No Physical Exam  Constitutional: She is oriented to person, place, and time. She appears well-developed and well-nourished. No distress.  HENT:  Head: Normocephalic and atraumatic.  Nose: Nose normal.  Mouth/Throat: Oropharynx is clear and moist. No oropharyngeal exudate.  Eyes: Conjunctivae and EOM are  normal. Pupils are equal, round, and reactive to light. No scleral icterus.  Neck: Normal range of motion. Neck supple. No JVD present. No tracheal deviation present. No thyromegaly present.  Cardiovascular: Normal rate, regular rhythm and normal heart sounds.  Exam reveals no gallop and no friction rub.   No murmur heard. Pulmonary/Chest: Effort normal and breath sounds normal. No respiratory distress. She has no wheezes. She exhibits no tenderness.  Abdominal: Soft. Bowel sounds are normal. She exhibits no distension and no mass. There is no tenderness. There is no rebound and no guarding.  Musculoskeletal: Normal range of motion. She exhibits no edema or tenderness.  Lymphadenopathy:    She has no cervical adenopathy.  Neurological: She is alert and oriented to person, place, and time. No cranial nerve deficit. She exhibits normal muscle tone.  Skin: Skin is warm and dry. No rash noted. She is not diaphoretic. No erythema. No pallor.  Nursing note and vitals reviewed.   ED Course  Procedures (including critical care time) Labs Review Labs Reviewed - No data to display  Imaging Review No results found.   EKG Interpretation None      MDM   Final diagnoses:  Knee pain  Fall    Patient since emergency department for pain in the right knee and ankle. X-rays do not show any acute abnormality. She was given Motrin emergency department for pain relief. Patient is advised for RICE therapy and will be discharged home with a Motrin prescription. Primary care follow-up was advised within 3 days, her vital signs were within normal limits and she is safe for discharge.    Tomasita Crumble, MD 04/11/14 [phone]

## 2014-04-28 ENCOUNTER — Encounter (HOSPITAL_COMMUNITY): Payer: Self-pay | Admitting: Emergency Medicine

## 2014-04-28 ENCOUNTER — Emergency Department (HOSPITAL_COMMUNITY)
Admission: EM | Admit: 2014-04-28 | Discharge: 2014-04-29 | Disposition: A | Discharge status: Home / Self Care | Payer: Medicaid Other | Attending: Emergency Medicine | Admitting: Emergency Medicine

## 2014-04-28 DIAGNOSIS — J45909 Unspecified asthma, uncomplicated: Secondary | ICD-10-CM | POA: Insufficient documentation

## 2014-04-28 DIAGNOSIS — R51 Headache: Secondary | ICD-10-CM | POA: Diagnosis not present

## 2014-04-28 DIAGNOSIS — R519 Headache, unspecified: Secondary | ICD-10-CM

## 2014-04-28 NOTE — ED Notes (Signed)
Patient c/o headache for several weeks, states she has tried tylenol, ibuprofen, and naproxen without relief. Patient c/o "knot" to left posterior head, c/o dizziness throughout today as well.

## 2014-04-29 ENCOUNTER — Emergency Department (HOSPITAL_COMMUNITY): Payer: Medicaid Other

## 2014-04-29 MED ORDER — METOCLOPRAMIDE HCL 5 MG/ML IJ SOLN
INTRAMUSCULAR | Status: AC
  Filled 2014-04-29: qty 2

## 2014-04-29 MED ORDER — KETOROLAC TROMETHAMINE 30 MG/ML IJ SOLN
INTRAMUSCULAR | Status: AC
  Filled 2014-04-29: qty 1

## 2014-04-29 MED ORDER — DIPHENHYDRAMINE HCL 50 MG/ML IJ SOLN
INTRAMUSCULAR | Status: AC
  Filled 2014-04-29: qty 1

## 2014-04-29 NOTE — ED Notes (Signed)
Patient continues to rest in bed with eyes closed.  Respirations even and unlabored.

## 2014-04-29 NOTE — ED Notes (Signed)
EPIC downtime, see paper charting

## 2014-05-19 ENCOUNTER — Emergency Department (HOSPITAL_COMMUNITY)
Admission: EM | Admit: 2014-05-19 | Discharge: 2014-05-19 | Disposition: A | Discharge status: Home / Self Care | Payer: Medicaid Other | Source: Home / Self Care | Attending: Family Medicine | Admitting: Family Medicine

## 2014-05-19 ENCOUNTER — Encounter (HOSPITAL_COMMUNITY): Payer: Self-pay | Admitting: *Deleted

## 2014-05-19 DIAGNOSIS — M25552 Pain in left hip: Secondary | ICD-10-CM

## 2014-05-19 DIAGNOSIS — M6730 Transient synovitis, unspecified site: Secondary | ICD-10-CM | POA: Diagnosis not present

## 2014-05-19 LAB — POCT URINALYSIS DIP (DEVICE)
Bilirubin Urine: NEGATIVE
Glucose, UA: NEGATIVE mg/dL
Ketones, ur: NEGATIVE mg/dL
NITRITE: NEGATIVE
PH: 6.5 (ref 5.0–8.0)
PROTEIN: NEGATIVE mg/dL
Specific Gravity, Urine: 1.02 (ref 1.005–1.030)
UROBILINOGEN UA: 0.2 mg/dL (ref 0.0–1.0)

## 2014-05-19 LAB — POCT PREGNANCY, URINE: PREG TEST UR: NEGATIVE

## 2014-05-19 MED ORDER — ETODOLAC 500 MG PO TABS: 500.0000 mg | ORAL_TABLET | Freq: Two times a day (BID) | ORAL | Status: DC

## 2014-05-19 MED ORDER — ONDANSETRON 8 MG PO TBDP: 8.0000 mg | ORAL_TABLET | Freq: Three times a day (TID) | ORAL | Status: DC | PRN

## 2014-05-19 NOTE — Discharge Instructions (Signed)
Hip Pain Your hip is the joint between your upper legs and your lower pelvis. The bones, cartilage, tendons, and muscles of your hip joint perform a lot of work each day supporting your body weight and allowing you to move around. Hip pain can range from a minor ache to severe pain in one or both of your hips. Pain may be felt on the inside of the hip joint near the groin, or the outside near the buttocks and upper thigh. You may have swelling or stiffness as well.  HOME CARE INSTRUCTIONS   Take medicines only as directed by your health care provider.  Apply ice to the injured area:  Put ice in a plastic bag.  Place a towel between your skin and the bag.  Leave the ice on for 15-20 minutes at a time, 3-4 times a day.  Keep your leg raised (elevated) when possible to lessen swelling.  Avoid activities that cause pain.  Follow specific exercises as directed by your health care provider.  Sleep with a pillow between your legs on your most comfortable side.  Record how often you have hip pain, the location of the pain, and what it feels like. SEEK MEDICAL CARE IF:   You are unable to put weight on your leg.  Your hip is red or swollen or very tender to touch.  Your pain or swelling continues or worsens after 1 week.  You have increasing difficulty walking.  You have a fever. SEEK IMMEDIATE MEDICAL CARE IF:   You have fallen.  You have a sudden increase in pain and swelling in your hip. MAKE SURE YOU:   Understand these instructions.  Will watch your condition.  Will get help right away if you are not doing well or get worse. Document Released: 06/22/2009 Document Revised: 05/19/2013 Document Reviewed: 08/29/2012 [facility] Patient Information 2015 [place], [place]. This information is not intended to replace advice given to you by your health care provider. Make sure you discuss any questions you have with your health care provider.  Transient Synovitis of the  Hip Transient synovitis is a common childhood condition involving pain and limited motion of the hip. It is called transient because the problem resolves gradually on its own. It usually improves after a few days, but it can last up to a couple of weeks. It is also called toxic synovitis.  CAUSES  The exact cause of transient synovitis is unknown. It may be due to a viral infection. Many children with transient synovitis had an upper respiratory infection or other infection shortly before developing hip symptoms. Injury to the hip area might also trigger the condition.  SYMPTOMS  Symptoms are usually mild. Aside from hip pain and a limp, the child is not usually ill. Symptoms may include:  Hip or groin pain (on one side only).  Limp with or without pain.  Thigh pain (on one side).  Knee pain (on one side).  Low-grade fever, less than 100.4 F (38 C) taken by mouth.  Crying at night (younger children). DIAGNOSIS  Your caregiver will want to rule out more serious causes of hip pain, limp, or not being able to walk. To do this your caregiver may do the following tests:  Blood tests.  Urine tests.  X-rays of the hip.  Ultrasound of the hip.  Needle aspiration of the hip if fluid is seen in the joint.  MRI scan. TREATMENT  Treatment of transient synovitis is usually done at home. In some cases, hospitalization  is needed to rule out a more serious cause. Activity can be resumed as tolerated when the pain begins to go away. Pain usually resolves in 1 to 2 weeks but can last 1 month in some patients. HOME CARE INSTRUCTIONS   Heat and massage of the area may be suggested.  Avoid putting weight on the affected leg.  Avoid full activity until the limp and pain have gone away almost completely.  Rest is important. Children can usually walk comfortably 1 to 2 days after beginning treatment. Restrict full activity (like running or sports) until fully recovered.  Only take  over-the-counter or prescription medicines for pain, discomfort, or fever as directed by your caregiver. SEEK MEDICAL CARE IF:   Your child has pain in other joints.  Your child has new, unexplained symptoms.  Your child has pain not controlled with the medicines prescribed.  Your child has pain that gets gradually worse or fails to improve.  Your child has pain that returns after a period of time with no pain.  Your child has a joint that becomes red or swollen. SEEK IMMEDIATE MEDICAL CARE IF:   Your child has severe pain.  Your child has a fever.  Your child refuses to walk. Document Released: 04/11/2007 Document Revised: 03/27/2011 Document Reviewed: 05/30/2010 [facility] Patient Information 2015 [place], [place]. This information is not intended to replace advice given to you by your health care provider. Make sure you discuss any questions you have with your health care provider.

## 2014-05-19 NOTE — ED Notes (Signed)
Pt is here with complaints of left sided flank/hip area pain.

## 2014-05-19 NOTE — ED Provider Notes (Signed)
CSN: [id number]     Arrival date & time 05/19/14  4098 History   None    Chief Complaint  Patient presents with  . Hip Pain    left   (Consider location/radiation/quality/duration/timing/severity/associated sxs/prior Treatment) HPI        24 year old female presents complaining of pain in the left hip and buttock. This started a few days ago. The pain is worse with she turns or when she puts weight on her left leg and is somewhat relieved with rest. She had a similar condition about a month ago, it resolved with ibuprofen. She has also had some nausea. She denies any other associated symptoms such as abdominal pain, vomiting, diarrhea. No pain in the hip joint. No areas of redness or swelling. She had a URI right before the first episode started.  Past Medical History  Diagnosis Date  . Scoliosis   . Asthma    History reviewed. No pertinent past surgical history. Family History  Problem Relation Age of Onset  . Alcohol abuse Mother   . Cirrhosis Mother   . COPD Father   . Cancer Brother   . Cancer Maternal Grandfather   . Stroke Maternal Grandfather   . Hypertension Maternal Grandfather   . Cancer Paternal Grandmother    History  Substance Use Topics  . Smoking status: Never Smoker   . Smokeless tobacco: Never Used  . Alcohol Use: No   OB History    Gravida Para Term Preterm AB TAB SAB Ectopic Multiple Living   Review of Systems  Musculoskeletal: Positive for myalgias and arthralgias.       Pain in the left hip and back area  All other systems reviewed and are negative.   Allergies  Strawberry  Home Medications   Prior to Admission medications   Medication Sig Start Date End Date Taking? Authorizing Provider  acetaminophen (TYLENOL) 325 MG tablet Take 650 mg by mouth every 6 (six) hours as needed for mild pain, fever or headache.    Historical Provider, MD  albuterol (PROVENTIL HFA;VENTOLIN HFA) 108 (90 BASE) MCG/ACT inhaler Inhale 1 puff into the  lungs every 6 (six) hours as needed for wheezing or shortness of breath.    Historical Provider, MD  ciprofloxacin (CIPRO) 500 MG tablet Take 1 tablet (500 mg total) by mouth 2 (two) times daily. One po bid x 7 days Patient not taking: Reported on 02/11/2014 12/29/13   Loren Racer, MD  cyclobenzaprine (FLEXERIL) 10 MG tablet Take 1 tablet (10 mg total) by mouth 2 (two) times daily as needed for muscle spasms. Patient not taking: Reported on 02/25/2014 10/10/13   Eber Hong, MD  dicyclomine (BENTYL) 20 MG tablet Take 1 tablet (20 mg total) by mouth 2 (two) times daily. Patient not taking: Reported on 04/11/2014 02/26/14   Loren Racer, MD  etodolac (LODINE) 500 MG tablet Take 1 tablet (500 mg total) by mouth 2 (two) times daily. 05/19/14   Graylon Good, PA-C  HYDROcodone-acetaminophen (NORCO/VICODIN) 5-325 MG per tablet Take 1 tablet by mouth every 4 (four) hours as needed for moderate pain. Patient not taking: Reported on 02/25/2014 02/11/14   Azalia Bilis, MD  ibuprofen (ADVIL,MOTRIN) 800 MG tablet Take 1 tablet (800 mg total) by mouth every 8 (eight) hours as needed for moderate pain. 04/11/14   Tomasita Crumble, MD  lansoprazole (PREVACID) 30 MG capsule Take 1 capsule (30 mg total) by mouth daily  at 12 noon. Patient not taking: Reported on 04/28/2014 02/26/14   Loren Raceravid Yelverton, MD  naproxen (NAPROSYN) 500 MG tablet Take 500 mg by mouth daily as needed. pain    Historical Provider, MD  ondansetron (ZOFRAN ODT) 8 MG disintegrating tablet Take 1 tablet (8 mg total) by mouth every 8 (eight) hours as needed for nausea or vomiting. 05/19/14   Adrian BlackwaterZachary H Catherene Kaleta, PA-C  ondansetron (ZOFRAN-ODT) 4 MG disintegrating tablet Take 1 tablet (4 mg total) by mouth every 8 (eight) hours as needed for nausea or vomiting. Patient not taking: Reported on 04/28/2014 02/26/14   Loren Raceravid Yelverton, MD  polyethylene glycol [facility](MIRALAX / Ethelene HalGLYCOLAX) packet Take 17 g by mouth daily. Patient taking differently: Take 17 g by mouth daily as  needed for mild constipation.  02/26/14   Loren Raceravid Yelverton, MD  traMADol (ULTRAM) 50 MG tablet Take 1 tablet (50 mg total) by mouth every 6 (six) hours as needed. Patient not taking: Reported on 02/25/2014 12/29/13   Loren Raceravid Yelverton, MD   BP 119/75 mmHg  Pulse 75  Temp(Src) 97.6 F (36.4 C) (Oral)  Resp 12  SpO2 100%  LMP 05/07/2014 Physical Exam  Constitutional: She is oriented to person, place, and time. Vital signs are normal. She appears well-developed and well-nourished. No distress.  HENT:  Head: Normocephalic and atraumatic.  Pulmonary/Chest: Effort normal. No respiratory distress.  Abdominal: Soft. Bowel sounds are normal. She exhibits no distension and no mass. There is no tenderness. There is no rebound, no guarding and no CVA tenderness.  Musculoskeletal:       Left hip: She exhibits tenderness.       Legs: There is pain with both flexion and extension of the left hip, and pain with internal rotation  Neurological: She is alert and oriented to person, place, and time. She has normal strength. Coordination normal.  Skin: Skin is warm and dry. No rash noted. She is not diaphoretic.  Psychiatric: She has a normal mood and affect. Judgment normal.  Nursing note and vitals reviewed.   ED Course  Procedures (including critical care time) Labs Review Labs Reviewed  POCT URINALYSIS DIP (DEVICE) - Abnormal; Notable for the following:    Hgb urine dipstick TRACE (*)    Leukocytes, UA TRACE (*)    All other components within normal limits  URINE CULTURE  POCT PREGNANCY, URINE    Imaging Review No results found.   MDM   1. Hip pain, left   2. Transient synovitis    Most likely transient synovitis. Treat with NSAIDs. Zofran for nausea, this may be secondary to pain. She is afebrile and nontoxic. Follow-up if worsening  Meds ordered this encounter  Medications  . etodolac (LODINE) 500 MG tablet    Sig: Take 1 tablet (500 mg total) by mouth 2 (two) times daily.     Dispense:  30 tablet    Refill:  1  . ondansetron (ZOFRAN ODT) 8 MG disintegrating tablet    Sig: Take 1 tablet (8 mg total) by mouth every 8 (eight) hours as needed for nausea or vomiting.    Dispense:  12 tablet    Refill:  0       Graylon GoodZachary H Roshaunda Starkey, PA-C 05/19/14 1244

## 2014-05-20 LAB — URINE CULTURE

## 2014-06-24 ENCOUNTER — Encounter (HOSPITAL_COMMUNITY): Payer: Self-pay | Admitting: *Deleted

## 2014-06-24 ENCOUNTER — Emergency Department (HOSPITAL_COMMUNITY)
Admission: EM | Admit: 2014-06-24 | Discharge: 2014-06-25 | Disposition: A | Discharge status: Home / Self Care | Payer: Medicaid Other | Attending: Emergency Medicine | Admitting: Emergency Medicine

## 2014-06-24 DIAGNOSIS — Z79899 Other long term (current) drug therapy: Secondary | ICD-10-CM | POA: Diagnosis not present

## 2014-06-24 DIAGNOSIS — Z3A01 Less than 8 weeks gestation of pregnancy: Secondary | ICD-10-CM | POA: Diagnosis not present

## 2014-06-24 DIAGNOSIS — O9989 Other specified diseases and conditions complicating pregnancy, childbirth and the puerperium: Secondary | ICD-10-CM | POA: Insufficient documentation

## 2014-06-24 DIAGNOSIS — M419 Scoliosis, unspecified: Secondary | ICD-10-CM | POA: Insufficient documentation

## 2014-06-24 DIAGNOSIS — J45909 Unspecified asthma, uncomplicated: Secondary | ICD-10-CM | POA: Diagnosis not present

## 2014-06-24 DIAGNOSIS — O99511 Diseases of the respiratory system complicating pregnancy, first trimester: Secondary | ICD-10-CM | POA: Insufficient documentation

## 2014-06-24 DIAGNOSIS — R110 Nausea: Secondary | ICD-10-CM | POA: Diagnosis not present

## 2014-06-24 DIAGNOSIS — Z3490 Encounter for supervision of normal pregnancy, unspecified, unspecified trimester: Secondary | ICD-10-CM

## 2014-06-24 LAB — URINALYSIS, ROUTINE W REFLEX MICROSCOPIC
Bilirubin Urine: NEGATIVE
Glucose, UA: NEGATIVE mg/dL
Hgb urine dipstick: NEGATIVE
Ketones, ur: NEGATIVE mg/dL
Nitrite: NEGATIVE
Protein, ur: NEGATIVE mg/dL
SPECIFIC GRAVITY, URINE: 1.022 (ref 1.005–1.030)
Urobilinogen, UA: 1 mg/dL (ref 0.0–1.0)
pH: 7 (ref 5.0–8.0)

## 2014-06-24 LAB — URINE MICROSCOPIC-ADD ON

## 2014-06-24 LAB — CBC WITH DIFFERENTIAL/PLATELET
BASOS ABS: 0 10*3/uL (ref 0.0–0.1)
Basophils Relative: 0 % (ref 0–1)
Eosinophils Absolute: 0.4 10*3/uL (ref 0.0–0.7)
Eosinophils Relative: 3 % (ref 0–5)
HCT: 39.6 % (ref 36.0–46.0)
Hemoglobin: 13.7 g/dL (ref 12.0–15.0)
LYMPHS ABS: 4.3 10*3/uL — AB (ref 0.7–4.0)
Lymphocytes Relative: 37 % (ref 12–46)
MCH: 30.9 pg (ref 26.0–34.0)
MCHC: 34.6 g/dL (ref 30.0–36.0)
MCV: 89.4 fL (ref 78.0–100.0)
Monocytes Absolute: 0.8 10*3/uL (ref 0.1–1.0)
Monocytes Relative: 7 % (ref 3–12)
NEUTROS ABS: 6.1 10*3/uL (ref 1.7–7.7)
NEUTROS PCT: 53 % (ref 43–77)
Platelets: 289 10*3/uL (ref 150–400)
RBC: 4.43 MIL/uL (ref 3.87–5.11)
RDW: 12.7 % (ref 11.5–15.5)
WBC: 11.6 10*3/uL — AB (ref 4.0–10.5)

## 2014-06-24 LAB — COMPREHENSIVE METABOLIC PANEL
ALT: 12 U/L — ABNORMAL LOW (ref 14–54)
AST: 17 U/L (ref 15–41)
Albumin: 2.9 g/dL — ABNORMAL LOW (ref 3.5–5.0)
Alkaline Phosphatase: 53 U/L (ref 38–126)
Anion gap: 8 (ref 5–15)
BILIRUBIN TOTAL: 0.2 mg/dL — AB (ref 0.3–1.2)
BUN: 11 mg/dL (ref 6–20)
CO2: 22 mmol/L (ref 22–32)
CREATININE: 0.77 mg/dL (ref 0.44–1.00)
Calcium: 8.6 mg/dL — ABNORMAL LOW (ref 8.9–10.3)
Chloride: 105 mmol/L (ref 101–111)
GLUCOSE: 104 mg/dL — AB (ref 65–99)
POTASSIUM: 3.7 mmol/L (ref 3.5–5.1)
Sodium: 135 mmol/L (ref 135–145)
TOTAL PROTEIN: 5.7 g/dL — AB (ref 6.5–8.1)

## 2014-06-24 LAB — LIPASE, BLOOD: Lipase: 18 U/L — ABNORMAL LOW (ref 22–51)

## 2014-06-24 LAB — I-STAT BETA HCG BLOOD, ED (MC, WL, AP ONLY): I-stat hCG, quantitative: 47.8 m[IU]/mL — ABNORMAL HIGH (ref <5)

## 2014-06-24 LAB — POC URINE PREG, ED: Preg Test, Ur: NEGATIVE

## 2014-06-24 NOTE — ED Notes (Signed)
Per pt, last menstrual cycle was in April.  Pt sts she took a home pregnancy test that came up positive, and she wanted to verify it was correct.  Pt sts she has been experiencing some nausea intermittingly, but denies any vomiting or specifically morning sickness.  NAD.  Denies any other symptoms.

## 2014-06-24 NOTE — ED Provider Notes (Signed)
CSN: [id number]     Arrival date & time 06/24/14  2111 History  This chart was scribed for Jinny Sanders, PA-C, working with Linwood Dibbles, MD by Chestine Spore, ED Scribe. The patient was seen in room TR06C/TR06C at 10:38 PM.    Chief Complaint  Patient presents with  . Nausea      The history is provided by the patient. No language interpreter was used.    HPI Comments: Sierra Evans is a 24 y.o. female [phone] who presents to the Emergency Department complaining of intermittent nausea onset 1 month. She reports that the nausea is worse in the morning. She reports that she took a home pregnancy test today that was positive and is here for evaluation of same. Patient's last menstrual period was 04/24/2014. This is the fourth pregnancy for the pt and she had an ectopic pregnancy for her second pregnancy. She reports that she feels as if she is pregnant. She denies vaginal bleeding, vaginal discharge, dizziness, weakness, diarrhea, constipation, abdominal pain, vomiting, and any other symptoms. Pt reports that she only takes prenatal vitamins.  Past Medical History  Diagnosis Date  . Scoliosis   . Asthma    History reviewed. No pertinent past surgical history. Family History  Problem Relation Age of Onset  . Alcohol abuse Mother   . Cirrhosis Mother   . COPD Father   . Cancer Brother   . Cancer Maternal Grandfather   . Stroke Maternal Grandfather   . Hypertension Maternal Grandfather   . Cancer Paternal Grandmother    History  Substance Use Topics  . Smoking status: Never Smoker   . Smokeless tobacco: Never Used  . Alcohol Use: No   OB History    Gravida Para Term Preterm AB TAB SAB Ectopic Multiple Living   Review of Systems  Gastrointestinal: Positive for nausea. Negative for vomiting, abdominal pain, diarrhea and constipation.  Genitourinary: Negative for vaginal bleeding and vaginal discharge.  Neurological: Negative for dizziness and weakness.     Allergies  Strawberry  Home Medications   Prior to Admission medications   Medication Sig Start Date End Date Taking? Authorizing Provider  acetaminophen (TYLENOL) 325 MG tablet Take 650 mg by mouth every 6 (six) hours as needed for mild pain, fever or headache.    Historical Provider, MD  albuterol (PROVENTIL HFA;VENTOLIN HFA) 108 (90 BASE) MCG/ACT inhaler Inhale 1 puff into the lungs every 6 (six) hours as needed for wheezing or shortness of breath.    Historical Provider, MD  ciprofloxacin (CIPRO) 500 MG tablet Take 1 tablet (500 mg total) by mouth 2 (two) times daily. One po bid x 7 days Patient not taking: Reported on 02/11/2014 12/29/13   Loren Racer, MD  cyclobenzaprine (FLEXERIL) 10 MG tablet Take 1 tablet (10 mg total) by mouth 2 (two) times daily as needed for muscle spasms. Patient not taking: Reported on 02/25/2014 10/10/13   Eber Hong, MD  dicyclomine (BENTYL) 20 MG tablet Take 1 tablet (20 mg total) by mouth 2 (two) times daily. Patient not taking: Reported on 04/11/2014 02/26/14   Loren Racer, MD  etodolac (LODINE) 500 MG tablet Take 1 tablet (500 mg total) by mouth 2 (two) times daily. 05/19/14   Graylon Good, PA-C  HYDROcodone-acetaminophen (NORCO/VICODIN) 5-325 MG per tablet Take 1 tablet by mouth every 4 (four) hours as needed for moderate pain. Patient not taking: Reported on 02/25/2014 02/11/14  Azalia BilisKevin Campos, MD  ibuprofen (ADVIL,MOTRIN) 800 MG tablet Take 1 tablet (800 mg total) by mouth every 8 (eight) hours as needed for moderate pain. 04/11/14   Tomasita CrumbleAdeleke Oni, MD  lansoprazole (PREVACID) 30 MG capsule Take 1 capsule (30 mg total) by mouth daily at 12 noon. Patient not taking: Reported on 04/28/2014 02/26/14   Loren Raceravid Yelverton, MD  naproxen (NAPROSYN) 500 MG tablet Take 500 mg by mouth daily as needed. pain    Historical Provider, MD  ondansetron (ZOFRAN ODT) 8 MG disintegrating tablet Take 1 tablet (8 mg total) by mouth every 8 (eight) hours as needed for nausea  or vomiting. 05/19/14   Adrian BlackwaterZachary H Baker, PA-C  ondansetron (ZOFRAN-ODT) 4 MG disintegrating tablet Take 1 tablet (4 mg total) by mouth every 8 (eight) hours as needed for nausea or vomiting. Patient not taking: Reported on 04/28/2014 02/26/14   Loren Raceravid Yelverton, MD  polyethylene glycol [facility](MIRALAX / Ethelene HalGLYCOLAX) packet Take 17 g by mouth daily. Patient taking differently: Take 17 g by mouth daily as needed for mild constipation.  02/26/14   Loren Raceravid Yelverton, MD  traMADol (ULTRAM) 50 MG tablet Take 1 tablet (50 mg total) by mouth every 6 (six) hours as needed. Patient not taking: Reported on 02/25/2014 12/29/13   Loren Raceravid Yelverton, MD   BP 125/98 mmHg  Pulse 77  Temp(Src) 98.6 F (37 C) (Oral)  Resp 20  SpO2 99%  LMP 04/24/2014 Physical Exam  Constitutional: She is oriented to person, place, and time. She appears well-developed and well-nourished. No distress.  HENT:  Head: Normocephalic and atraumatic.  Mouth/Throat: Oropharynx is clear and moist. No oropharyngeal exudate.  Eyes: EOM are normal. Right eye exhibits no discharge. Left eye exhibits no discharge. No scleral icterus.  Neck: Normal range of motion. Neck supple. No tracheal deviation present.  Cardiovascular: Normal rate, regular rhythm and normal heart sounds.  Exam reveals no gallop and no friction rub.   No murmur heard. Pulmonary/Chest: Effort normal and breath sounds normal. No respiratory distress. She has no wheezes. She has no rales.  Abdominal: Soft. Normal appearance. There is no tenderness. There is no rigidity, no guarding, no tenderness at McBurney's point and negative Murphy's sign.  Musculoskeletal: Normal range of motion. She exhibits no edema or tenderness.  Neurological: She is alert and oriented to person, place, and time. No cranial nerve deficit. Coordination normal.  Skin: Skin is warm and dry. No rash noted. She is not diaphoretic.  Psychiatric: She has a normal mood and affect. Her behavior is normal.  Nursing note and  vitals reviewed.   ED Course  Procedures (including critical care time) DIAGNOSTIC STUDIES: Oxygen Saturation is 99% on RA, nl by my interpretation.    COORDINATION OF CARE: 4:56 PM-Discussed treatment plan which includes lab evaluation with pt at bedside and pt agreed to plan.   Labs Review Labs Reviewed  URINALYSIS, ROUTINE W REFLEX [facility]CROSCOPIC ([facility]) - Abnormal; Notable for the following:    APPearance CLOUDY (*)    Leukocytes, UA LARGE (*)    All other components within normal limits  URINE MICROSCOPIC-ADD ON - Abnormal; Notable for the following:    Squamous Epithelial / LPF FEW (*)    Bacteria, UA FEW (*)    All other components within normal limits  CBC WITH DIFFERENTIAL/PLATELET - Abnormal; Notable for the following:    WBC 11.6 (*)    Lymphs Abs 4.3 (*)    All other components within normal limits  COMPREHENSIVE METABOLIC PANEL -  Abnormal; Notable for the following:    Glucose, Bld 104 (*)    Calcium 8.6 (*)    Total Protein 5.7 (*)    Albumin 2.9 (*)    ALT 12 (*)    Total Bilirubin 0.2 (*)    All other components within normal limits  LIPASE, BLOOD - Abnormal; Notable for the following:    Lipase 18 (*)    All other components within normal limits  I-STAT BETA HCG BLOOD, ED ([facility], [facility], [facility] ONLY) - Abnormal; Notable for the following:    I-stat hCG, quantitative 47.8 (*)    All other components within normal limits  URINE CULTURE  POC URINE PREG, ED    Imaging Review No results found.   EKG Interpretation None      MDM   Final diagnoses:  Pregnancy    Patient is nontoxic, nonseptic appearing, in no apparent distress.  Pt in no pain, has mild nausea S vomiting.   Labs, and vitals reviewed.  Pt had positive urine preg at home with negative here.  Will order bhcg for more sensitive test.    Hcg positive at 48.  Likely pt's mild nausea due to 1st trimester of pregnancy.  Patient does not meet the SIRS or Sepsis criteria.  On exam patient has no  abdominal pain.  No indication of appendicitis, bowel obstruction, bowel perforation, cholecystitis, diverticulitis, PID or ectopic pregnancy.  Patient discharged home with symptomatic treatment and given strict instructions for follow-up with OBGYN in the next 2 wks for serial hcg given pt's hx of ectopic pregnancy, 2 months of amenorrhea, and low (1-2wk) hcg level today.  I have also discussed reasons to return immediately to the ER.  Patient expresses understanding and agrees with plan.  I personally performed the services described in this documentation, which was scribed in my presence. The recorded information has been reviewed and is accurate.  BP 125/98 mmHg  Pulse 77  Temp(Src) 98.6 F (37 C) (Oral)  Resp 20  SpO2 99%  LMP 04/24/2014  Signed,  Ladona Mow, PA-C 4:56 PM    Ladona Mow, PA-C 06/25/14 1656  Linwood Dibbles, MD 06/25/14 1911

## 2014-06-24 NOTE — ED Notes (Signed)
The pt last had a period April  She took a home preg test and since then she has had nausea.  None now.

## 2014-06-25 MED ORDER — ACETAMINOPHEN 325 MG PO TABS
650.0000 mg | ORAL_TABLET | Freq: Once | ORAL | Status: AC
  Administered 2014-06-25: 650 mg via ORAL
  Filled 2014-06-25: qty 2

## 2014-06-25 NOTE — Discharge Instructions (Signed)
First Trimester of Pregnancy °The first trimester of pregnancy is from week 1 until the end of week 12 (months 1 through 3). A week after a sperm fertilizes an egg, the egg will implant on the wall of the uterus. This embryo will begin to develop into a baby. Genes from you and your partner are forming the baby. The female genes determine whether the baby is a boy or a girl. At 6-8 weeks, the eyes and face are formed, and the heartbeat can be seen on ultrasound. At the end of 12 weeks, all the baby's organs are formed.  °Now that you are pregnant, you will want to do everything you can to have a healthy baby. Two of the most important things are to get good prenatal care and to follow your health care provider's instructions. Prenatal care is all the medical care you receive before the baby's birth. This care will help prevent, find, and treat any problems during the pregnancy and childbirth. °BODY CHANGES °Your body goes through many changes during pregnancy. The changes vary from woman to woman.  °· You may gain or lose a couple of pounds at first. °· You may feel sick to your stomach (nauseous) and throw up (vomit). If the vomiting is uncontrollable, call your health care provider. °· You may tire easily. °· You may develop headaches that can be relieved by medicines approved by your health care provider. °· You may urinate more often. Painful urination may mean you have a bladder infection. °· You may develop heartburn as a result of your pregnancy. °· You may develop constipation because certain hormones are causing the muscles that push waste through your intestines to slow down. °· You may develop hemorrhoids or swollen, bulging veins (varicose veins). °· Your breasts may begin to grow larger and become tender. Your nipples may stick out more, and the tissue that surrounds them (areola) may become darker. °· Your gums may bleed and may be sensitive to brushing and flossing. °· Dark spots or blotches (chloasma,  mask of pregnancy) may develop on your face. This will likely fade after the baby is born. °· Your menstrual periods will stop. °· You may have a loss of appetite. °· You may develop cravings for certain kinds of food. °· You may have changes in your emotions from day to day, such as being excited to be pregnant or being concerned that something may go wrong with the pregnancy and baby. °· You may have more vivid and strange dreams. °· You may have changes in your hair. These can include thickening of your hair, rapid growth, and changes in texture. Some women also have hair loss during or after pregnancy, or hair that feels dry or thin. Your hair will most likely return to normal after your baby is born. °WHAT TO EXPECT AT YOUR PRENATAL VISITS °During a routine prenatal visit: °· You will be weighed to make sure you and the baby are growing normally. °· Your blood pressure will be taken. °· Your abdomen will be measured to track your baby's growth. °· The fetal heartbeat will be listened to starting around week 10 or 12 of your pregnancy. °· Test results from any previous visits will be discussed. °Your health care provider may ask you: °· How you are feeling. °· If you are feeling the baby move. °· If you have had any abnormal symptoms, such as leaking fluid, bleeding, severe headaches, or abdominal cramping. °· If you have any questions. °Other tests   that may be performed during your first trimester include: °· Blood tests to find your blood type and to check for the presence of any previous infections. They will also be used to check for low iron levels (anemia) and Rh antibodies. Later in the pregnancy, blood tests for diabetes will be done along with other tests if problems develop. °· Urine tests to check for infections, diabetes, or protein in the urine. °· An ultrasound to confirm the proper growth and development of the baby. °· An amniocentesis to check for possible genetic problems. °· Fetal screens for  spina bifida and Down syndrome. °· You may need other tests to make sure you and the baby are doing well. °HOME CARE INSTRUCTIONS  °Medicines °· Follow your health care provider's instructions regarding medicine use. Specific medicines may be either safe or unsafe to take during pregnancy. °· Take your prenatal vitamins as directed. °· If you develop constipation, try taking a stool softener if your health care provider approves. °Diet °· Eat regular, well-balanced meals. Choose a variety of foods, such as meat or vegetable-based protein, fish, milk and low-fat dairy products, vegetables, fruits, and whole grain breads and cereals. Your health care provider will help you determine the amount of weight gain that is right for you. °· Avoid raw meat and uncooked cheese. These carry germs that can cause birth defects in the baby. °· Eating four or five small meals rather than three large meals a day may help relieve nausea and vomiting. If you start to feel nauseous, eating a few soda crackers can be helpful. Drinking liquids between meals instead of during meals also seems to help nausea and vomiting. °· If you develop constipation, eat more high-fiber foods, such as fresh vegetables or fruit and whole grains. Drink enough fluids to keep your urine clear or pale yellow. °Activity and Exercise °· Exercise only as directed by your health care provider. Exercising will help you: °¨ Control your weight. °¨ Stay in shape. °¨ Be prepared for labor and delivery. °· Experiencing pain or cramping in the lower abdomen or low back is a good sign that you should stop exercising. Check with your health care provider before continuing normal exercises. °· Try to avoid standing for long periods of time. Move your legs often if you must stand in one place for a long time. °· Avoid heavy lifting. °· Wear low-heeled shoes, and practice good posture. °· You may continue to have sex unless your health care provider directs you  otherwise. °Relief of Pain or Discomfort °· Wear a good support bra for breast tenderness.   °· Take warm sitz baths to soothe any pain or discomfort caused by hemorrhoids. Use hemorrhoid cream if your health care provider approves.   °· Rest with your legs elevated if you have leg cramps or low back pain. °· If you develop varicose veins in your legs, wear support hose. Elevate your feet for 15 minutes, 3-4 times a day. Limit salt in your diet. °Prenatal Care °· Schedule your prenatal visits by the twelfth week of pregnancy. They are usually scheduled monthly at first, then more often in the last 2 months before delivery. °· Write down your questions. Take them to your prenatal visits. °· Keep all your prenatal visits as directed by your health care provider. °Safety °· Wear your seat belt at all times when driving. °· Make a list of emergency phone numbers, including numbers for family, friends, the hospital, and police and fire departments. °General Tips °·   Ask your health care provider for a referral to a local prenatal education class. Begin classes no later than at the beginning of month 6 of your pregnancy.  Ask for help if you have counseling or nutritional needs during pregnancy. Your health care provider can offer advice or refer you to specialists for help with various needs.  Do not use hot tubs, steam rooms, or saunas.  Do not douche or use tampons or scented sanitary pads.  Do not cross your legs for long periods of time.  Avoid cat litter boxes and soil used by cats. These carry germs that can cause birth defects in the baby and possibly loss of the fetus by miscarriage or stillbirth.  Avoid all smoking, herbs, alcohol, and medicines not prescribed by your health care provider. Chemicals in these affect the formation and growth of the baby.  Schedule a dentist appointment. At home, brush your teeth with a soft toothbrush and be gentle when you floss. SEEK MEDICAL CARE IF:   You have  dizziness.  You have mild pelvic cramps, pelvic pressure, or nagging pain in the abdominal area.  You have persistent nausea, vomiting, or diarrhea.  You have a bad smelling vaginal discharge.  You have pain with urination.  You notice increased swelling in your face, hands, legs, or ankles. SEEK IMMEDIATE MEDICAL CARE IF:   You have a fever.  You are leaking fluid from your vagina.  You have spotting or bleeding from your vagina.  You have severe abdominal cramping or pain.  You have rapid weight gain or loss.  You vomit blood or material that looks like coffee grounds.  You are exposed to [place] measles and have never had them.  You are exposed to fifth disease or chickenpox.  You develop a severe headache.  You have shortness of breath.  You have any kind of trauma, such as from a fall or a car accident. Document Released: 12/27/2000 Document Revised: 05/19/2013 Document Reviewed: 11/12/2012 [facility] Patient Information 2015 [employer], [place]. This information is not intended to replace advice given to you by your health care provider. Make sure you discuss any questions you have with your health care provider.   Emergency Department Resource Guide 1) Find a Doctor and Pay Out of Pocket Although you won't have to find out who is covered by your insurance plan, it is a good idea to ask around and get recommendations. You will then need to call the office and see if the doctor you have chosen will accept you as a new patient and what types of options they offer for patients who are self-pay. Some doctors offer discounts or will set up payment plans for their patients who do not have insurance, but you will need to ask so you aren't surprised when you get to your appointment.  2) Contact Your Local Health Department Not all health departments have doctors that can see patients for sick visits, but many do, so it is worth a call to see if yours does. If you don't know where  your local health department is, you can check in your phone book. The CDC also has a tool to help you locate your state's health department, and many state websites also have listings of all of their local health departments.  3) Find a Walk-in Clinic If your illness is not likely to be very severe or complicated, you may want to try a walk in clinic. These are popping up all over the country in pharmacies,  drugstores, and shopping centers. They're usually staffed by nurse practitioners or physician assistants that have been trained to treat common illnesses and complaints. They're usually fairly quick and inexpensive. However, if you have serious medical issues or chronic medical problems, these are probably not your best option.  No Primary Care Doctor: - Call Health Connect at  [phone] - they can help you locate a primary care doctor that  accepts your insurance, provides certain services, etc. - Physician Referral Service- [phone]  Chronic Pain Problems: Organization         Address  Phone   Notes  [facility]  [phone] Patients need to be referred by their primary care doctor.   Medication Assistance: Organization         Address  Phone   Notes  [facility] Medication [facility] [address] [place], [place] [address] [phone] --Must be a resident of [facility] -- Must have NO insurance coverage whatsoever (no Medicaid/ Medicare, etc.) -- The pt. MUST have a primary care doctor that directs their care regularly and follows them in the community   [facility]  [phone]   [employer]  [phone]    Agencies that provide inexpensive medical care: Organization         Address  Phone   Notes  [facility]  [phone]   [facility]    [phone]   [facility] [address] [place], [place] [address] [phone]   [facility] [address]  [place] [place] [phone]   Planned Parenthood    [phone]   [facility]    [phone]   [facility]  [address], [place] Phone:  [phone], Fax:  [phone] Hours of Operation:  9 am - 6 pm, M-F.  Also accepts Medicaid/Medicare and self-pay.  [facility]  [address], [place] Phone: [phone], Fax: [phone]. Hours of Operation:  8:30 am - 5:30 pm, M-F.  Also accepts Medicaid and self-pay.  [facility] [place] [address], [place] Phone: [phone]   [facility] [address] Natasha Bence [place], [place] [phone], Ext. [phone] Mondays & Thursdays: 7-9 AM.  First 15 patients are seen on a first come, first serve basis.    Medicaid-accepting [facility] Providers:  Organization         Address  Phone   Notes  [facility] [address], [place] [phone] Also accepts self-pay patients.  [facility] [address] Laurell Josephs [place], [place]  [phone]   [facility] [address], [place] [phone]   [facility] [address] [place] [phone]   Renaye Rakers [address], [place]   [phone] Only accepts [place] Access [place] patients after they have their name applied to their card.   Self-Pay (no insurance) in [facility]:  Organization         Address  Phone   Notes  Sickle Cell Patients, [facility] [address] [place], [place] [phone]   [facility] [address] [place], [place] [phone]   [facility]  [address], [place] [phone])  [phone]   [facility]/Dr. Julio Sicks  [address] [place] or [address], [place] [phone] Phone number for both [place] and [place] locations is the same.  [facility] [address], [place] [phone]   [facility] [address] [place] or [address] [phone]   [facility] [address] [place] [phone], phone; [phone], fax Sees patients 1st and 3rd Saturday of every month.  Must not qualify for public or private insurance (i.e. Medicaid, Medicare, [place] Health Choice, Veterans' Benefits)  Household income should be no more than 200% of the poverty level The clinic cannot treat you if you are pregnant or think you are pregnant  Sexually transmitted diseases are not treated at the clinic.    Dental Care: Organization         Address  Phone  Notes  [facility] Department of [facility] [address] [place], [place] [phone] Accepts children up to age 37 who are enrolled in [place] or [place] Health Choice; pregnant women with a Medicaid card; and children who have applied for Medicaid or [place] Health Choice, but were declined, whose parents can pay a reduced fee at time of service.  [facility]  [address], [place] [phone] Accepts children up to age 32 who are enrolled in [place] or [place] Health Choice; pregnant women with a Medicaid card; and children who have applied for Medicaid or [place] Health Choice, but were declined, whose parents can pay a reduced fee at time of service.  [facility]  [address] [place], [place] [phone] Patients are seen by appointment only. Walk-ins are not accepted. [facility] will see patients 70 years of age and older. Monday - Tuesday (8am-5pm) Most Wednesdays (8:30-5pm) $30 per visit, cash only  [facility]  [address], [facility] [phone] Patients are seen by appointment only. Walk-ins are not  accepted. [facility] will see patients 17 years of age and older. One Wednesday Evening (Monthly: Volunteer Based).  $30 per visit, cash only  [employer]  [phone] for adults; Children under age 22, call Graduate Pediatric Dentistry at [phone]. Children aged 39-14, please call [phone] to request a pediatric application.  Dental services are provided in all areas of dental care including fillings, crowns and bridges, complete and partial dentures, implants, gum treatment, root canals, and extractions. Preventive care is also provided. Treatment is provided to both adults and children. Patients are selected via a lottery and there is often a waiting list.   [facility] [address], [place]  [phone] [email or web address]   [facility] [address] [place], [place] [phone], Ext. [phone] Second and Fourth Thursday of each month, opens at 6:30 AM; Clinic ends at 9 AM.  Patients are seen on a first-come first-served basis, and a limited number are seen during each clinic.   [facility]  [address] Ether Griffins [place], [place] [phone]   Eligibility Requirements You must have lived in [place], [place], or [place] counties for at least the last three months.   You cannot be eligible for state or federal sponsored [place], including [employer], [place], or [employer].   You generally cannot be eligible  for healthcare insurance through your employer.    How to apply: Eligibility screenings are held every Tuesday and Wednesday afternoon from 1:00 pm until 4:00 pm. You do not need an appointment for the interview!  [facility] [address], [place], [place] [phone]   [facility]  [phone]   [facility]  [phone]   [facility]  [phone]    Behavioral Health Resources in the  Community: Intensive Outpatient Programs Organization         Address  Phone  Notes  [facility] [address], [place], [place] [phone]   [facility] [address], [place], [place] [phone]   [facility] [address], [place], [place]  [phone]   [facility] [address],  [place], [place] [phone] or [phone]   Substance Abuse Resources Organization         Address  Phone  Notes  [facility]  [phone]   [facility]  [phone]   [facility]  [phone]   [facility]  [phone]   Residential & Outpatient Substance Abuse Program  [phone]   Psychological Services Organization         Address  Phone  Notes  [facility]  [phone]- [phone]   [facility]  [phone]- [phone]   [facility] [address], [place] [phone] or [phone]    Mobile Crisis Teams Organization         Address  Phone  Notes  [facility], Mobile Crisis Care Unit  [phone]   [facility]  [address] [place], [place] [phone]   Doristine LocksSharon DeEsch [address] [place] [phone]    Self-Help/Support Groups Organization         Address  Phone             Notes  [facility] [place] - variety of support groups  336- [phone] Call for more information  Narcotics Anonymous (NA), [facility] [address], [place]  2 meetings at this location   Statisticianesidential Treatment Programs Organization         Address  Phone  Notes  [facility] [address],    [place]  [phone]   [facility]  [address], [place] [phone], [place], [place] [phone]   [facility] [address], [place] [phone] Admissions: 8am-3pm M-F  [facility] [address]    [place], [place] [phone]   [facility] [address], [place], [place] [phone]   [facility] [address]  [place], [place] [phone]   [facility] - Intensive Outpatient [address], [place], [place] [phone]   [facility] ([facility] [address]  [place], [place] [phone] or [phone]   [facility] ([facility]) [address] [place], [place] [phone] Accepts Medicaid  [facility] [address]  [place] [phone] Substance Abuse/Addiction Treatment   [facility] Resources Organization         Address  Phone  Notes  [facility]  [phone]   Angie FavaJulie Brannon, PhD [address] [place], [place]   [phone] or [phone]   [facility]   [address] [place],  [place] [phone]   [facility] [address] [place], [place] [phone] Insurance/Medicaid/sponsorship through [employer] and Families [address], [address] [place], [place] [phone] [place] [address]   [place], [place] [phone]    Dr. Adele Schilder  [phone]   [facility] Dept. 1) [address] [place] 2) [place] 3)  [place] [address], [place] [phone]  [phone]   [place] [phone] or [phone] (After Hours)

## 2014-06-25 NOTE — ED Notes (Signed)
PA at bedside.

## 2014-06-26 LAB — URINE CULTURE

## 2014-07-01 ENCOUNTER — Other Ambulatory Visit: Payer: Medicaid Other

## 2014-07-08 ENCOUNTER — Encounter (HOSPITAL_COMMUNITY): Payer: Self-pay | Admitting: *Deleted

## 2014-07-08 ENCOUNTER — Inpatient Hospital Stay (HOSPITAL_COMMUNITY)
Admission: AD | Admit: 2014-07-08 | Discharge: 2014-07-08 | Disposition: A | Discharge status: Home / Self Care | Payer: Medicaid Other | Source: Ambulatory Visit | Attending: Obstetrics & Gynecology | Admitting: Obstetrics & Gynecology

## 2014-07-08 DIAGNOSIS — Z3A10 10 weeks gestation of pregnancy: Secondary | ICD-10-CM | POA: Diagnosis not present

## 2014-07-08 DIAGNOSIS — O210 Mild hyperemesis gravidarum: Secondary | ICD-10-CM | POA: Diagnosis present

## 2014-07-08 DIAGNOSIS — E860 Dehydration: Secondary | ICD-10-CM

## 2014-07-08 DIAGNOSIS — O219 Vomiting of pregnancy, unspecified: Secondary | ICD-10-CM

## 2014-07-08 DIAGNOSIS — O211 Hyperemesis gravidarum with metabolic disturbance: Secondary | ICD-10-CM | POA: Insufficient documentation

## 2014-07-08 LAB — URINALYSIS, ROUTINE W REFLEX MICROSCOPIC
Bilirubin Urine: NEGATIVE
Glucose, UA: NEGATIVE mg/dL
Hgb urine dipstick: NEGATIVE
Ketones, ur: 15 mg/dL — AB
Nitrite: NEGATIVE
PROTEIN: NEGATIVE mg/dL
Specific Gravity, Urine: 1.02 (ref 1.005–1.030)
UROBILINOGEN UA: 1 mg/dL (ref 0.0–1.0)
pH: 6 (ref 5.0–8.0)

## 2014-07-08 LAB — URINE MICROSCOPIC-ADD ON

## 2014-07-08 MED ORDER — PROMETHAZINE HCL 25 MG/ML IJ SOLN
25.0000 mg | Freq: Once | INTRAVENOUS | Status: AC
  Administered 2014-07-08: 25 mg via INTRAVENOUS
  Filled 2014-07-08: qty 1

## 2014-07-08 MED ORDER — PROMETHAZINE HCL 12.5 MG RE SUPP: 12.5000 mg | Freq: Four times a day (QID) | RECTAL | Status: DC | PRN

## 2014-07-08 MED ORDER — DOXYLAMINE-PYRIDOXINE 10-10 MG PO TBEC: 1.0000 | DELAYED_RELEASE_TABLET | Freq: Two times a day (BID) | ORAL | Status: DC

## 2014-07-08 NOTE — Discharge Instructions (Signed)

## 2014-07-08 NOTE — MAU Provider Note (Signed)
History     CSN: [id number]  Arrival date and time: 07/08/14 1539   First Provider Initiated Contact with Patient 07/08/14 1608      Chief Complaint  Patient presents with  . Emesis During Pregnancy   HPI Comments: Sierra Evans is a 24 y.o. [phone] at [redacted]w[redacted]d presenting with nausea and vomiting for the past week. Has vomited several times today and feels weak. Denies abdominal pain or vaginal bleeding.   Emesis  This is a new problem. The current episode started in the past 7 days. The problem occurs 2 to 4 times per day. The problem has been gradually worsening. The emesis has an appearance of bile. There has been no fever. Associated symptoms include coughing and URI. Pertinent negatives include no abdominal pain, chest pain, diarrhea, dizziness, fever or headaches. She has tried nothing for the symptoms.    OB History  Gravida Para Term Preterm AB SAB TAB Ectopic Multiple Living  0   0  2    # Outcome Date GA Lbr Len/2nd Weight Sex Delivery Anes PTL Lv  4 Current           3 Term 04/18/11    Heide Scales  2 Term 04/02/09    Genella Mech   Y  1 Gravida              Comments: System Generated. Please review and update pregnancy details.       Past Medical History  Diagnosis Date  . Scoliosis   . Asthma     History reviewed. No pertinent past surgical history.  Family History  Problem Relation Age of Onset  . Alcohol abuse Mother   . Cirrhosis Mother   . COPD Father   . Cancer Brother   . Cancer Maternal Grandfather   . Stroke Maternal Grandfather   . Hypertension Maternal Grandfather   . Cancer Paternal Grandmother     History  Substance Use Topics  . Smoking status: Never Smoker   . Smokeless tobacco: Never Used  . Alcohol Use: No    Allergies:  Allergies  Allergen Reactions  . Strawberry Hives    Prescriptions prior to admission  Medication Sig Dispense Refill Last Dose  . acetaminophen (TYLENOL) 325 MG tablet Take 650 mg by mouth  every 6 (six) hours as needed for mild pain, fever or headache.   Past Week at Unknown time  . albuterol (PROVENTIL HFA;VENTOLIN HFA) 108 (90 BASE) MCG/ACT inhaler Inhale 1 puff into the lungs every 6 (six) hours as needed for wheezing or shortness of breath.   PRN  . ciprofloxacin (CIPRO) 500 MG tablet Take 1 tablet (500 mg total) by mouth 2 (two) times daily. One po bid x 7 days (Patient not taking: Reported on 02/11/2014) 14 tablet 0   . cyclobenzaprine (FLEXERIL) 10 MG tablet Take 1 tablet (10 mg total) by mouth 2 (two) times daily as needed for muscle spasms. (Patient not taking: Reported on 02/25/2014) 20 tablet 0 couple months  . dicyclomine (BENTYL) 20 MG tablet Take 1 tablet (20 mg total) by mouth 2 (two) times daily. (Patient not taking: Reported on 04/11/2014) 20 tablet 0   . etodolac (LODINE) 500 MG tablet Take 1 tablet (500 mg total) by mouth 2 (two) times daily. 30 tablet 1   . HYDROcodone-acetaminophen (NORCO/VICODIN) 5-325 MG per tablet Take 1 tablet by mouth every 4 (four) hours as needed for moderate pain. (Patient not taking: Reported  on 02/25/2014) 15 tablet 0   . ibuprofen (ADVIL,MOTRIN) 800 MG tablet Take 1 tablet (800 mg total) by mouth every 8 (eight) hours as needed for moderate pain. 15 tablet 0 04/27/2014 at Unknown time  . lansoprazole (PREVACID) 30 MG capsule Take 1 capsule (30 mg total) by mouth daily at 12 noon. (Patient not taking: Reported on 04/28/2014) 30 capsule 0 Not Taking at Unknown time  . naproxen (NAPROSYN) 500 MG tablet Take 500 mg by mouth daily as needed. pain   Past Week at Unknown time  . ondansetron (ZOFRAN ODT) 8 MG disintegrating tablet Take 1 tablet (8 mg total) by mouth every 8 (eight) hours as needed for nausea or vomiting. 12 tablet 0   . ondansetron (ZOFRAN-ODT) 4 MG disintegrating tablet Take 1 tablet (4 mg total) by mouth every 8 (eight) hours as needed for nausea or vomiting. (Patient not taking: Reported on 04/28/2014) 10 tablet 0 Not Taking at Unknown  time  . polyethylene glycol (MIRALAX / GLYCOLAX) packet Take 17 g by mouth daily. (Patient taking differently: Take 17 g by mouth daily as needed for mild constipation. ) 14 each 0 PRN  . traMADol (ULTRAM) 50 MG tablet Take 1 tablet (50 mg total) by mouth every 6 (six) hours as needed. (Patient not taking: Reported on 02/25/2014) 15 tablet 0 couple months    Review of Systems  Constitutional: Negative for fever.  Respiratory: Positive for cough.   Cardiovascular: Negative for chest pain.  Gastrointestinal: Positive for vomiting. Negative for abdominal pain and diarrhea.  Neurological: Negative for dizziness and headaches.   Physical Exam   Blood pressure 119/71, pulse 98, temperature 98.3 F (36.8 C), temperature source Oral, resp. rate 18, height 5\' 2"  (1.575 m), weight 80.831 kg (178 lb 3.2 oz), last menstrual period 04/24/2014.  Physical Exam  Nursing note and vitals reviewed. Constitutional: She is oriented to person, place, and time.  Obese. Mild distress and vomited after arrival in room  HENT:  Head: Normocephalic.  Eyes: Pupils are equal, round, and reactive to light.  Neck: Normal range of motion. Neck supple.  Cardiovascular: Normal rate.   Respiratory: Effort normal.  GI: Soft.  DT FHR not heard  Genitourinary:  No FHT per Doppler  Musculoskeletal: Normal range of motion.  Neurological: She is alert and oriented to person, place, and time.  Skin: Skin is warm and dry.    MAU Course  Procedures Results for orders placed or performed during the hospital encounter of 07/08/14 (from the past 24 hour(s))  Urinalysis, Routine w reflex microscopic (not at [facility])     Status: Abnormal   Collection Time: 07/08/14  3:53 PM  Result Value Ref Range   Color, Urine YELLOW YELLOW   APPearance CLEAR CLEAR   Specific Gravity, Urine 1.020 1.005 - 1.030   pH 6.0 5.0 - 8.0   Glucose, UA NEGATIVE NEGATIVE mg/dL   Hgb urine dipstick NEGATIVE NEGATIVE   Bilirubin Urine NEGATIVE  NEGATIVE   Ketones, ur 15 (A) NEGATIVE mg/dL   Protein, ur NEGATIVE NEGATIVE mg/dL   Urobilinogen, UA 1.0 0.0 - 1.0 mg/dL   Nitrite NEGATIVE NEGATIVE   Leukocytes, UA SMALL (A) NEGATIVE  Urine microscopic-add on     Status: Abnormal   Collection Time: 07/08/14  3:53 PM  Result Value Ref Range   Squamous Epithelial / LPF MANY (A) RARE   WBC, UA 3-6 <3 WBC/hpf   RBC / HPF 0-2 <3 RBC/hpf   Bacteria, UA MANY (A) RARE  Urine-Other MUCOUS PRESENT     IV LR 1000 with Phenergan > nausea much improved and retaining fluids  Assessment and Plan   1. Nausea and vomiting during pregnancy prior to [redacted] weeks gestation   2. Mild dehydration    Discharge with AVS morning sickness   Medication List    STOP taking these medications        dicyclomine 20 MG tablet  Commonly known as:  BENTYL     etodolac 500 MG tablet  Commonly known as:  LODINE     HYDROcodone-acetaminophen 5-325 MG per tablet  Commonly known as:  NORCO/VICODIN     ibuprofen 800 MG tablet  Commonly known as:  ADVIL,MOTRIN     lansoprazole 30 MG capsule  Commonly known as:  PREVACID     ondansetron 4 MG disintegrating tablet  Commonly known as:  ZOFRAN ODT     ondansetron 8 MG disintegrating tablet  Commonly known as:  ZOFRAN ODT     prenatal multivitamin Tabs tablet      TAKE these medications        albuterol 108 (90 BASE) MCG/ACT inhaler  Commonly known as:  PROVENTIL HFA;VENTOLIN HFA  Inhale 1 puff into the lungs every 6 (six) hours as needed for wheezing or shortness of breath.     Doxylamine-Pyridoxine 10-10 MG Tbec  Commonly known as:  DICLEGIS  Take 1 tablet by mouth 2 (two) times daily.     loratadine 10 MG tablet  Commonly known as:  CLARITIN  Take 10 mg by mouth daily as needed.     polyethylene glycol packet  Commonly known as:  MIRALAX / GLYCOLAX  Take 17 g by mouth daily.     promethazine 12.5 MG suppository  Commonly known as:  PHENERGAN  Place 1 suppository (12.5 mg total)  rectally every 6 (six) hours as needed for nausea or vomiting.       Follow-up Information    Follow up with Kathreen Cosier, MD On 07/21/2014.   Specialty:  Obstetrics and Gynecology   Why:  Keep your scheduled prenatal appointment   Contact information:   [address] [place] [address] [phone]       Wilburt Messina 07/08/2014, 4:38 PM

## 2014-07-08 NOTE — MAU Note (Signed)
Vomiting for the past week, feeling weak, unable to keep prenatal vitamins down.  Denies pain, no bleeding.

## 2015-05-13 ENCOUNTER — Encounter (HOSPITAL_COMMUNITY): Payer: Self-pay | Admitting: *Deleted

## 2016-02-11 IMAGING — CR DG FINGER LITTLE 2+V*L*
3 series · 3 of 3 positions shown · non-contrast
Comparison: None.

CLINICAL DATA: Left fifth digit injury, pain

EXAM:
LEFT LITTLE FINGER 2+V

[x finger pa left]
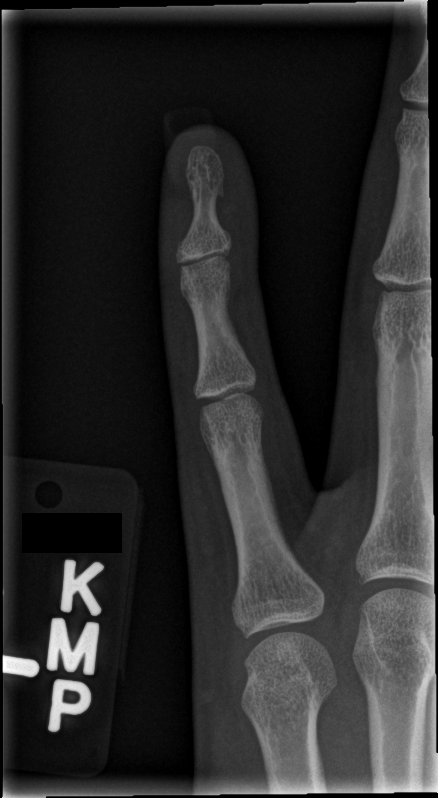

[x finger obl left]
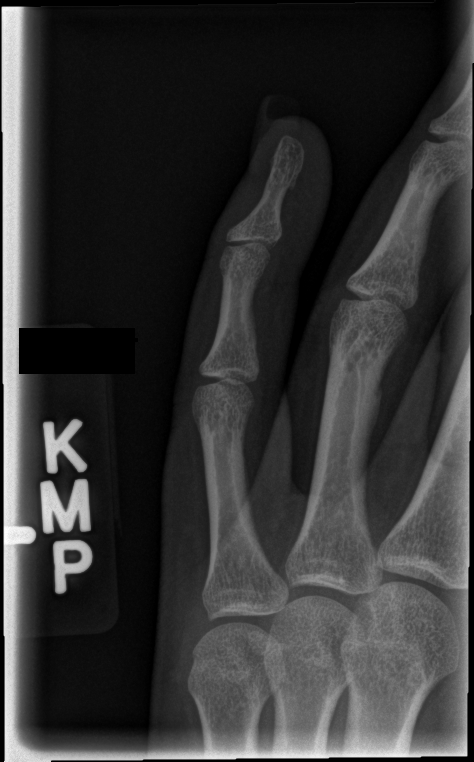

[x finger lat left]
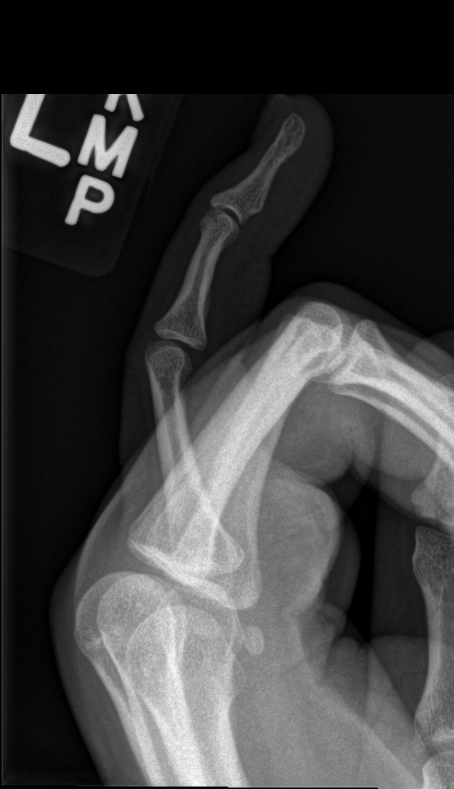

[3 of 3 positions shown; findings below may reference images not displayed]

FINDINGS: There is no evidence of fracture or dislocation. There is no
evidence of arthropathy or other focal bone abnormality. Soft
tissues are unremarkable.
IMPRESSION: Negative.

## 2016-10-01 IMAGING — CT CT HEAD W/O CM
2 series · 16 of 30 positions shown, 20 images · non-contrast
Comparison: None.

CLINICAL DATA: Headache for 3 weeks, increased today.

EXAM:
CT HEAD WITHOUT CONTRAST
TECHNIQUE: Contiguous axial images were obtained from the base of the skull
through the vertex without intravenous contrast.

[Series 2: head w/o · axial · non-contrast · 0.45mm/px · z∈[+1431,+1566]mm · 13 of 33 slices shown, 17 images]
[im 3/33  brain]
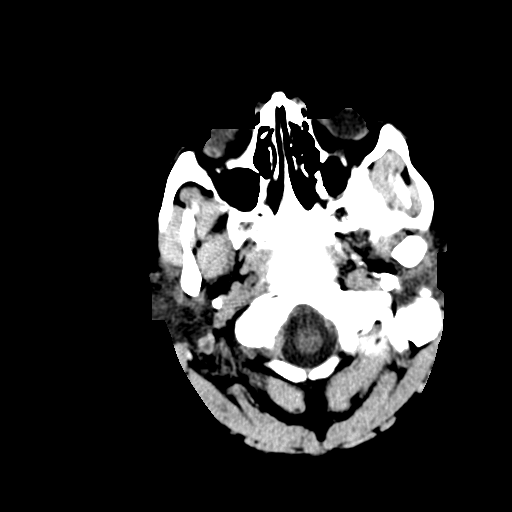
[im 3/33  bone]
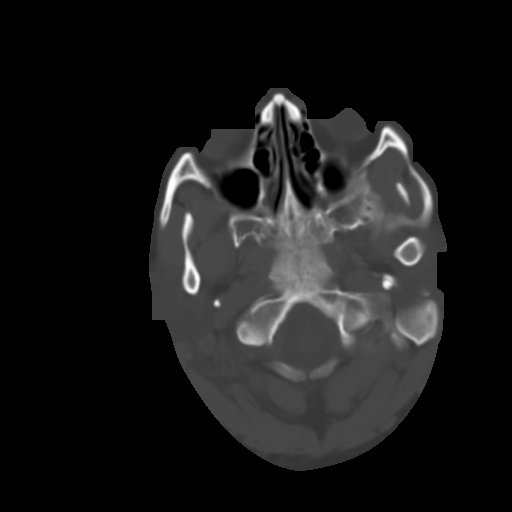
[im 5/33  brain]
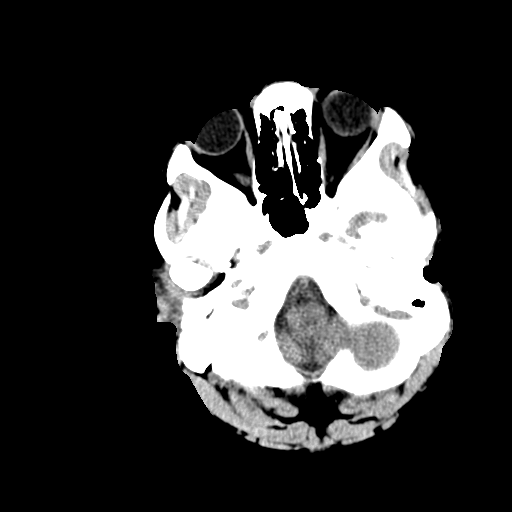
[im 7/33  brain]
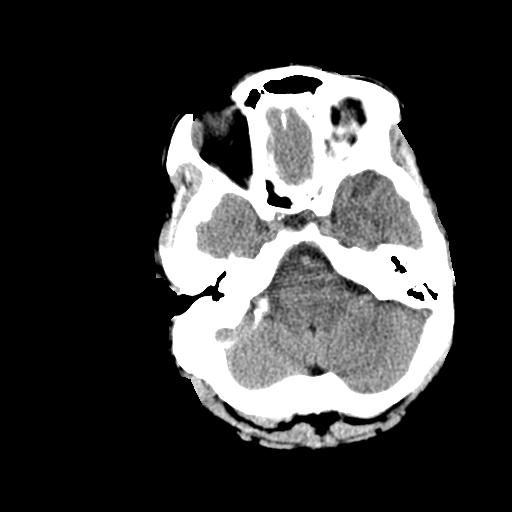
[im 10/33  brain]
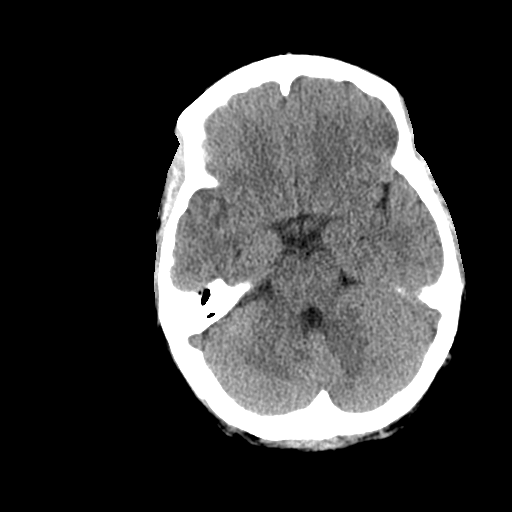
[im 12/33  brain]
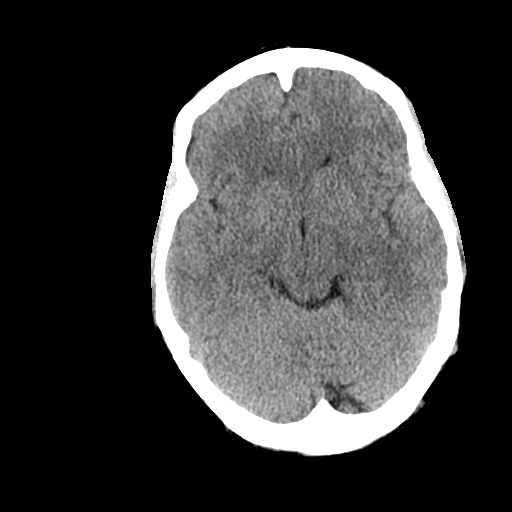
[im 12/33  bone]
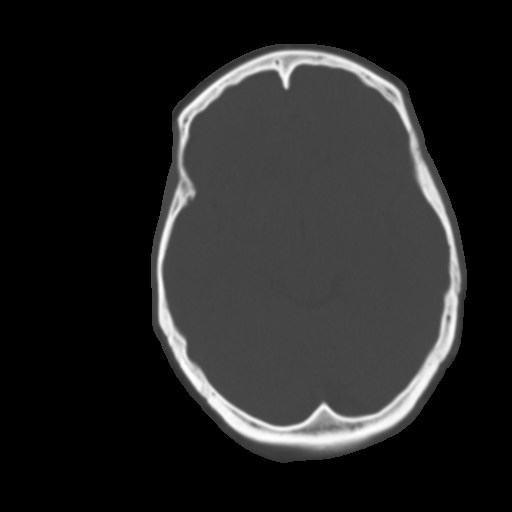
[im 14/33  brain]
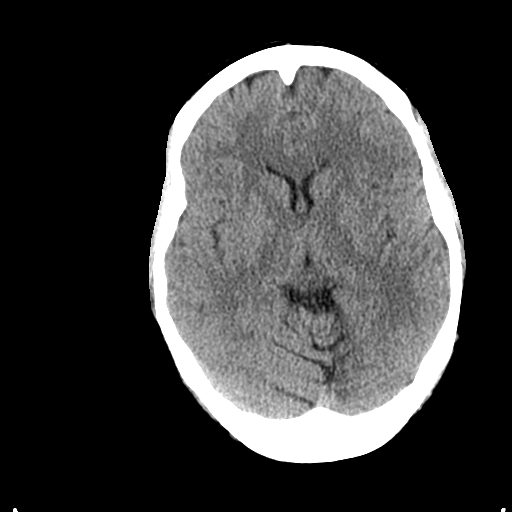
[im 17/33  brain]
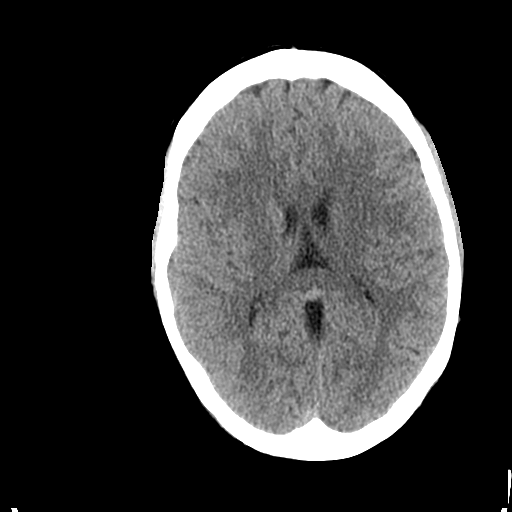
[im 19/33  brain]
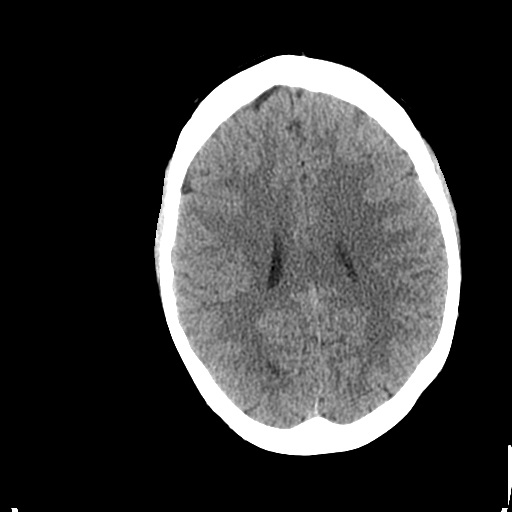
[im 21/33  brain]
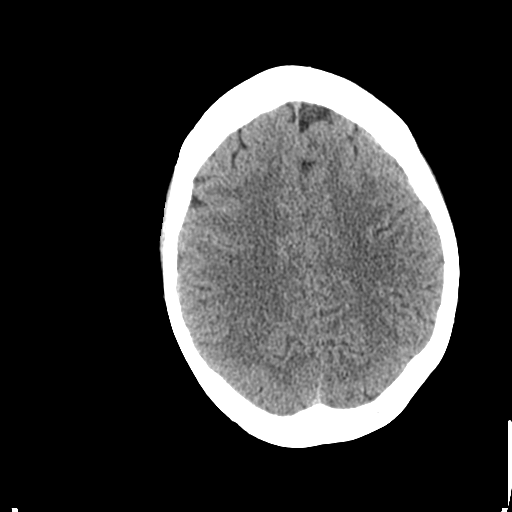
[im 21/33  bone]
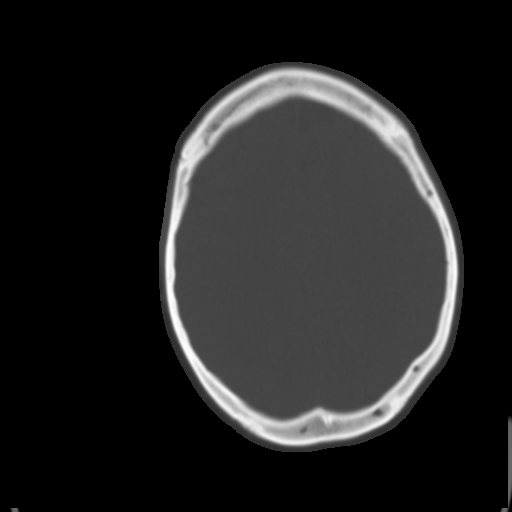
[im 23/33  brain]
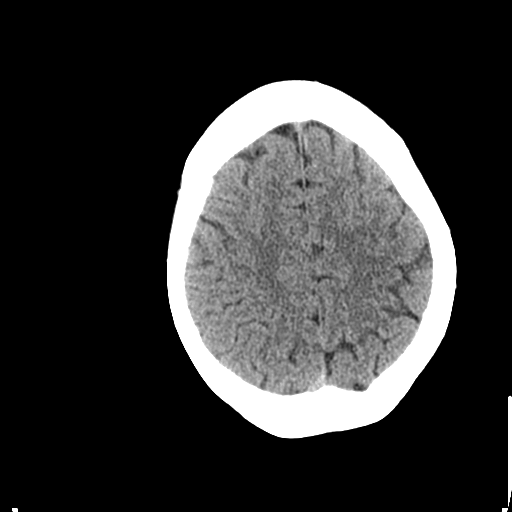
[im 26/33  brain]
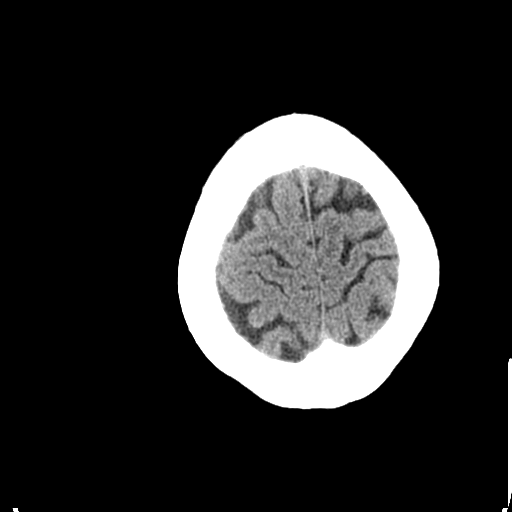
[im 28/33  brain]
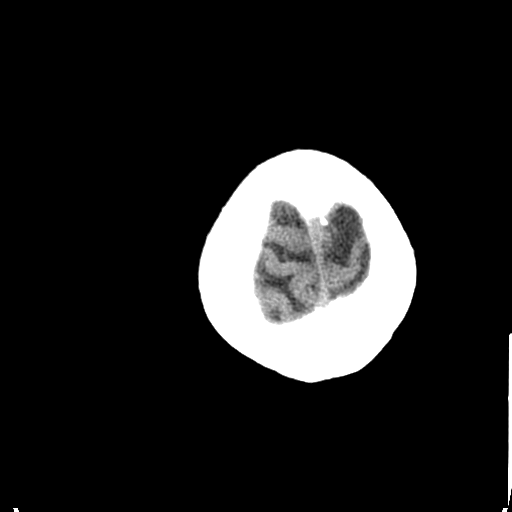
[im 30/33  brain]
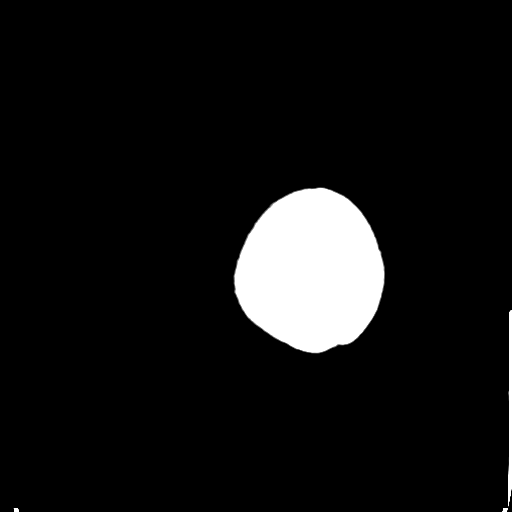
[im 30/33  bone]
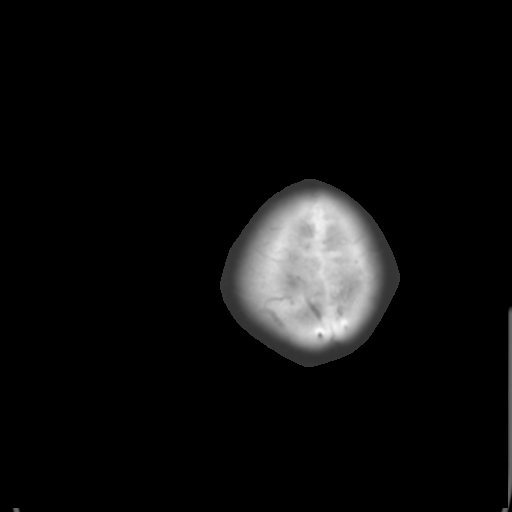

[Series 3: bone windows · axial · 0.45mm/px · z∈[+1431,+1476]mm · 3 of 33 slices shown]
[im 3/33  bone]
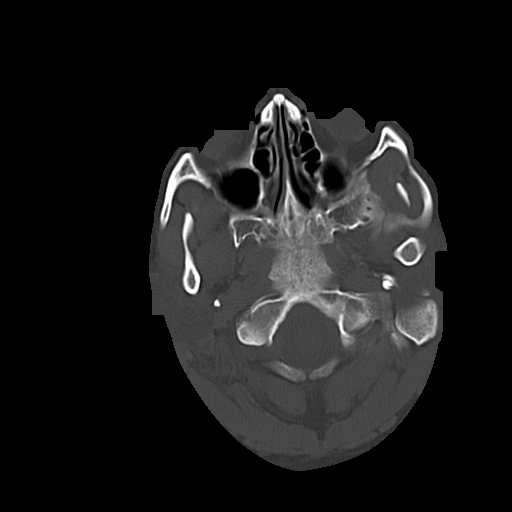
[im 7/33  bone]
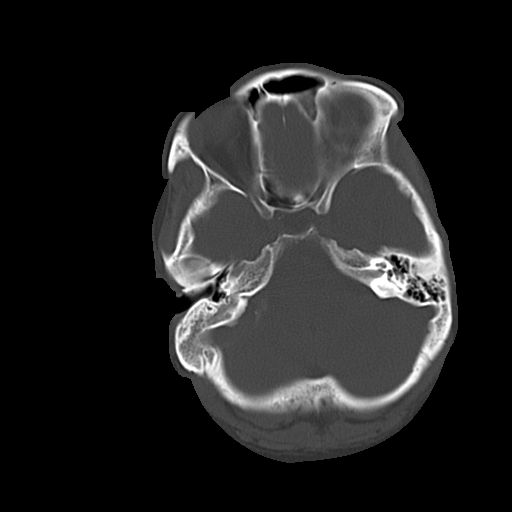
[im 12/33  bone]
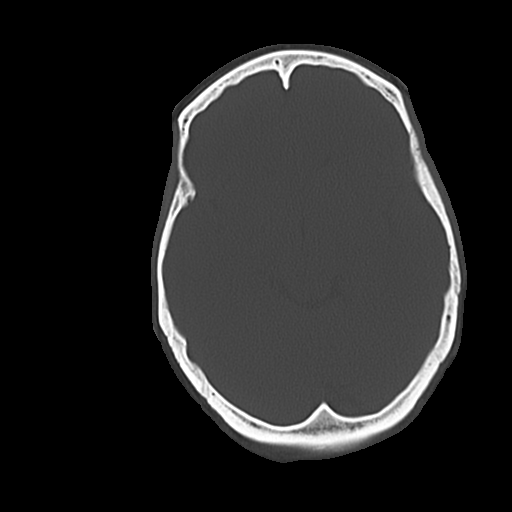

[16 of 30 positions shown; findings below may reference images not displayed]

FINDINGS: There is no fracture or destructive process. No sinusitis or
mastoiditis.

The orbits are negative.

No infarct, hemorrhage, hydrocephalus, or mass lesion.
IMPRESSION: Negative head CT.

## 2016-10-07 ENCOUNTER — Emergency Department (HOSPITAL_COMMUNITY)
Admission: EM | Admit: 2016-10-07 | Discharge: 2016-10-07 | Disposition: A | Discharge status: Home / Self Care | Payer: Self-pay | Attending: Emergency Medicine | Admitting: Emergency Medicine

## 2016-10-07 ENCOUNTER — Encounter (HOSPITAL_COMMUNITY): Payer: Self-pay | Admitting: Emergency Medicine

## 2016-10-07 DIAGNOSIS — J45909 Unspecified asthma, uncomplicated: Secondary | ICD-10-CM | POA: Insufficient documentation

## 2016-10-07 DIAGNOSIS — R300 Dysuria: Secondary | ICD-10-CM | POA: Insufficient documentation

## 2016-10-07 DIAGNOSIS — Z79899 Other long term (current) drug therapy: Secondary | ICD-10-CM | POA: Insufficient documentation

## 2016-10-07 DIAGNOSIS — R109 Unspecified abdominal pain: Secondary | ICD-10-CM

## 2016-10-07 DIAGNOSIS — L739 Follicular disorder, unspecified: Secondary | ICD-10-CM | POA: Insufficient documentation

## 2016-10-07 DIAGNOSIS — N390 Urinary tract infection, site not specified: Secondary | ICD-10-CM | POA: Insufficient documentation

## 2016-10-07 LAB — URINALYSIS, ROUTINE W REFLEX MICROSCOPIC
BILIRUBIN URINE: NEGATIVE
Glucose, UA: NEGATIVE mg/dL
Ketones, ur: NEGATIVE mg/dL
NITRITE: NEGATIVE
Protein, ur: 30 mg/dL — AB
SPECIFIC GRAVITY, URINE: 1.026 (ref 1.005–1.030)
pH: 5 (ref 5.0–8.0)

## 2016-10-07 LAB — POC URINE PREG, ED: Preg Test, Ur: NEGATIVE

## 2016-10-07 MED ORDER — CEPHALEXIN 500 MG PO CAPS: 500.0000 mg | ORAL_CAPSULE | Freq: Four times a day (QID) | ORAL | 0 refills | Status: DC

## 2016-10-07 MED ORDER — CEPHALEXIN 250 MG PO CAPS
500.0000 mg | ORAL_CAPSULE | Freq: Once | ORAL | Status: AC
  Administered 2016-10-07: 500 mg via ORAL
  Filled 2016-10-07: qty 2

## 2016-10-07 NOTE — ED Triage Notes (Signed)
C/o L flank pain, pain with urination, and urinary frequency since yesterday.

## 2016-10-07 NOTE — ED Provider Notes (Signed)
[facility]-EMERGENCY DEPT Provider Note   CSN: [id number] Arrival date & time: 10/07/16  6295     History   Chief Complaint Chief Complaint  Patient presents with  . Flank Pain  . Dysuria    HPI Sierra Evans is a 26 y.o. female.  HPI 26 year old female G3 P3 currently menstruating presents today complaining of dysuria and frequency of urination that began yesterday in the morning. She has had multiple urinary tract infections in the past but does not report being on antibiotics or having symptoms for the past 2 years. She is currently menstruating but otherwise denies abnormal vaginal discharge. The pain also in her left flank. It is constant in nature. She has no history of kidney stones. The urine sample was a clean-catch urine. She denies nausea, vomiting, or fever.  Past Medical History:  Diagnosis Date  . Asthma   . Scoliosis     Patient Active Problem List   Diagnosis Date Noted  . Nausea and vomiting during pregnancy prior to [redacted] weeks gestation 07/08/2014    Past Surgical History:  Procedure Laterality Date  . CESAREAN SECTION      OB History    Gravida Para Term Preterm AB Living   0 2   SAB TAB Ectopic Multiple Live Births       0   2       Home Medications    Prior to Admission medications   Medication Sig Start Date End Date Taking? Authorizing Provider  albuterol (PROVENTIL HFA;VENTOLIN HFA) 108 (90 BASE) MCG/ACT inhaler Inhale 1 puff into the lungs every 6 (six) hours as needed for wheezing or shortness of breath.    [provider]  Doxylamine-Pyridoxine (DICLEGIS) 10-10 MG TBEC Take 1 tablet by mouth 2 (two) times daily. 07/08/14   Poe, Deirdre C, CNM  loratadine (CLARITIN) 10 MG tablet Take 10 mg by mouth daily as needed.    [provider]  polyethylene glycol (MIRALAX / GLYCOLAX) packet Take 17 g by mouth daily. Patient taking differently: Take 17 g by mouth daily as needed for mild constipation.  02/26/14   Loren Racer,  MD  promethazine (PHENERGAN) 12.5 MG suppository Place 1 suppository (12.5 mg total) rectally every 6 (six) hours as needed for nausea or vomiting. 07/08/14   Poe, Deirdre Salena Saner, CNM    Family History Family History  Problem Relation Age of Onset  . Alcohol abuse Mother   . Cirrhosis Mother   . COPD Father   . Cancer Brother   . Cancer Maternal Grandfather   . Stroke Maternal Grandfather   . Hypertension Maternal Grandfather   . Cancer Paternal Grandmother     Social History Social History  Substance Use Topics  . Smoking status: Never Smoker  . Smokeless tobacco: Never Used  . Alcohol use No     Allergies   Strawberry extract   Review of Systems Review of Systems  All other systems reviewed and are negative.    Physical Exam Updated Vital Signs BP 129/79 (BP Location: Left Arm)   Pulse 91   Temp 97.6 F (36.4 C) (Oral)   Resp 18   LMP 10/02/2016   SpO2 100%   Physical Exam  Constitutional: She is oriented to person, place, and time. She appears well-developed and well-nourished. No distress.  HENT:  Head: Normocephalic and atraumatic.  Right Ear: External ear normal.  Left Ear: External ear normal.  Nose: Nose normal.  Mouth/Throat:  Oropharynx is clear and moist.  Eyes: Pupils are equal, round, and reactive to light. EOM are normal.  Neck: Normal range of motion.  Cardiovascular: Normal rate.   Pulmonary/Chest: Effort normal and breath sounds normal.  Abdominal: Soft. Bowel sounds are normal.  Genitourinary:  Genitourinary Comments: Mild left CVA tenderness No right CVA tenderness  Musculoskeletal: Normal range of motion.  Neurological: She is alert and oriented to person, place, and time.  Skin: Skin is warm and dry. Capillary refill takes less than 2 seconds. Rash noted.  Mild erythematous follicular areas bilateral thighs  Psychiatric: She has a normal mood and affect.  Nursing note and vitals reviewed.    ED Treatments / Results  Labs (all labs  ordered are listed, but only abnormal results are displayed) Labs Reviewed  URINALYSIS, ROUTINE W REFLEX MICROSCOPIC - Abnormal; Notable for the following:       Result Value   APPearance CLOUDY (*)    Hgb urine dipstick SMALL (*)    Protein, ur 30 (*)    Leukocytes, UA LARGE (*)    Bacteria, UA FEW (*)    Squamous Epithelial / LPF 6-30 (*)    All other components within normal limits  POC URINE PREG, ED    EKG  EKG Interpretation None       Radiology No results found.  Procedures Procedures (including critical care time)  Medications Ordered in ED Medications  cephALEXin (KEFLEX) capsule 500 mg (not administered)     Initial Impression / Assessment and Plan / ED Course  I have reviewed the triage vital signs and the nursing notes.  Pertinent labs & imaging results that were available during my care of the patient were reviewed by me and considered in my medical decision making (see chart for details).    1 urinary tract infection. Will give first dose of Keflex here. Prescription for Keflex applied.  There are few red blood cells in the urine with too numerous count white blood cells. I suspect that the blood cells in the urine are from menstruation. 2. Folliculitis we have discussed measures such as keeping the area clean and dry and observation.  Discussed patient return precautions and need for follow-up and she voices understanding.  Final Clinical Impressions(s) / ED Diagnoses   Final diagnoses:  Urinary tract infection without hematuria, site unspecified  Left flank pain  Folliculitis    New Prescriptions New Prescriptions   CEPHALEXIN (KEFLEX) 500 MG CAPSULE    Take 1 capsule (500 mg total) by mouth 4 (four) times daily.     Margarita Grizzle, MD 10/07/16 [phone]

## 2016-10-07 NOTE — Discharge Instructions (Signed)
Please take all antibiotics as prescribed. Keep the area of rash clean and dry and open to air as much as possible Return if you develop worsening symptoms such as vomiting with inability to keep down your medications, fever, or worsening pain

## 2017-03-09 DIAGNOSIS — D649 Anemia, unspecified: Secondary | ICD-10-CM | POA: Insufficient documentation

## 2017-03-09 DIAGNOSIS — M419 Scoliosis, unspecified: Secondary | ICD-10-CM | POA: Insufficient documentation

## 2017-03-09 DIAGNOSIS — Z973 Presence of spectacles and contact lenses: Secondary | ICD-10-CM | POA: Insufficient documentation

## 2017-03-09 DIAGNOSIS — E669 Obesity, unspecified: Secondary | ICD-10-CM | POA: Insufficient documentation

## 2017-03-10 DIAGNOSIS — D509 Iron deficiency anemia, unspecified: Secondary | ICD-10-CM | POA: Insufficient documentation

## 2017-03-10 DIAGNOSIS — J45909 Unspecified asthma, uncomplicated: Secondary | ICD-10-CM | POA: Insufficient documentation

## 2017-07-17 DIAGNOSIS — K2950 Unspecified chronic gastritis without bleeding: Secondary | ICD-10-CM | POA: Insufficient documentation

## 2018-03-17 HISTORY — PX: HYSTEROSCOPY: SHX211

## 2019-04-13 ENCOUNTER — Ambulatory Visit (HOSPITAL_COMMUNITY)
Admission: EM | Admit: 2019-04-13 | Discharge: 2019-04-13 | Disposition: A | Discharge status: Home / Self Care | Payer: Medicaid Other | Attending: Family Medicine | Admitting: Family Medicine

## 2019-04-13 ENCOUNTER — Encounter (HOSPITAL_COMMUNITY): Payer: Self-pay

## 2019-04-13 ENCOUNTER — Other Ambulatory Visit: Payer: Self-pay

## 2019-04-13 DIAGNOSIS — Z20822 Contact with and (suspected) exposure to covid-19: Secondary | ICD-10-CM

## 2019-04-13 LAB — SARS CORONAVIRUS 2 (TAT 6-24 HRS): SARS Coronavirus 2: NEGATIVE

## 2019-04-13 NOTE — ED Triage Notes (Signed)
Pt states she was exposed to someone that was COVID + and wants a test. Pt denies COVID symptoms at this time.

## 2019-04-13 NOTE — ED Provider Notes (Signed)
[facility]   MRN: [id number] DOB: April 17, 1990  Subjective:   Sierra Evans is a 29 y.o. female presenting for COVID-19 testing given close exposure.  Patient denies having any kind of symptoms.  She is higher risk because of her history of asthma.  No current facility-administered medications for this encounter.  Current Outpatient Medications:  .  albuterol (PROVENTIL HFA;VENTOLIN HFA) 108 (90 BASE) MCG/ACT inhaler, Inhale 1 puff into the lungs every 6 (six) hours as needed for wheezing or shortness of breath., Disp: , Rfl:  .  cephALEXin (KEFLEX) 500 MG capsule, Take 1 capsule (500 mg total) by mouth 4 (four) times daily., Disp: 20 capsule, Rfl: 0 .  Doxylamine-Pyridoxine (DICLEGIS) 10-10 MG TBEC, Take 1 tablet by mouth 2 (two) times daily., Disp: 60 tablet, Rfl: 0 .  loratadine (CLARITIN) 10 MG tablet, Take 10 mg by mouth daily as needed., Disp: , Rfl:  .  polyethylene glycol (MIRALAX / GLYCOLAX) packet, Take 17 g by mouth daily. (Patient taking differently: Take 17 g by mouth daily as needed for mild constipation. ), Disp: 14 each, Rfl: 0 .  promethazine (PHENERGAN) 12.5 MG suppository, Place 1 suppository (12.5 mg total) rectally every 6 (six) hours as needed for nausea or vomiting., Disp: 12 each, Rfl: 0   Allergies  Allergen Reactions  . Strawberry Extract Hives    Past Medical History:  Diagnosis Date  . Asthma   . Scoliosis      Past Surgical History:  Procedure Laterality Date  . cervix scrape Bilateral   . CESAREAN SECTION      Family History  Problem Relation Age of Onset  . Alcohol abuse Mother   . Cirrhosis Mother   . COPD Father   . Cancer Brother   . Cancer Maternal Grandfather   . Stroke Maternal Grandfather   . Hypertension Maternal Grandfather   . Cancer Paternal Grandmother     Social History   Tobacco Use  . Smoking status: Never Smoker  . Smokeless tobacco: Never Used  Substance Use Topics  . Alcohol use: No  . Drug use: No      Review of Systems  Constitutional: Negative for fever and malaise/fatigue.  HENT: Negative for congestion, ear pain, sinus pain and sore throat.   Eyes: Negative for discharge and redness.  Respiratory: Negative for cough, hemoptysis, shortness of breath and wheezing.   Cardiovascular: Negative for chest pain.  Gastrointestinal: Negative for abdominal pain, diarrhea, nausea and vomiting.  Genitourinary: Negative for dysuria, flank pain and hematuria.  Musculoskeletal: Negative for myalgias.  Skin: Negative for rash.  Neurological: Negative for dizziness, weakness and headaches.  Psychiatric/Behavioral: Negative for depression and substance abuse.     Objective:   Vitals: BP 103/76   Pulse 81   Temp 98.3 F (36.8 C) (Oral)   Resp 16   Ht 5\' 2"  (1.575 m)   Wt 175 lb (79.4 kg)   SpO2 100%   BMI 32.01 kg/m   Physical Exam Constitutional:      General: She is not in acute distress.    Appearance: Normal appearance. She is well-developed. She is not ill-appearing, toxic-appearing or diaphoretic.  HENT:     Head: Normocephalic and atraumatic.     Nose: Nose normal.     Mouth/Throat:     Mouth: Mucous membranes are moist.  Eyes:     Extraocular Movements: Extraocular movements intact.     Pupils: Pupils are equal, round, and reactive to light.  Cardiovascular:  Rate and Rhythm: Normal rate and regular rhythm.     Pulses: Normal pulses.     Heart sounds: Normal heart sounds. No murmur. No friction rub. No gallop.   Pulmonary:     Effort: Pulmonary effort is normal. No respiratory distress.     Breath sounds: Normal breath sounds. No stridor. No wheezing, rhonchi or rales.  Skin:    General: Skin is warm and dry.     Findings: No rash.  Neurological:     Mental Status: She is alert and oriented to person, place, and time.  Psychiatric:        Mood and Affect: Mood normal.        Behavior: Behavior normal.        Thought Content: Thought content normal.         Judgment: Judgment normal.      Assessment and Plan :   1. Exposure to COVID-19 virus     Counseled patient on nature of COVID-19 including modes of transmission, diagnostic testing, management and supportive care.  Counseled on medications used for symptomatic relief. COVID 19 testing is pending. Counseled patient on potential for adverse effects with medications prescribed/recommended today, ER and return-to-clinic precautions discussed, patient verbalized understanding.     Wallis Bamberg, [place] 04/13/19 2909

## 2019-04-13 NOTE — Discharge Instructions (Signed)
We will notify you of your COVID-19 test results as they arrive and may take between 2 to 7 days.  In the meantime, if you develop symptoms including fever, chest pain, shortness of breath despite our current treatment plan then please report to the emergency room as this may be a sign of worsening status from possible COVID-19 infection.   For sore throat or cough try using a honey-based tea. Use 3 teaspoons of honey with juice squeezed from half lemon. Place shaved pieces of ginger into 1/2-1 cup of water and warm over stove top. Then mix the ingredients and repeat every 4 hours as needed. Please take Tylenol 325mg every 6 hours. Hydrate very well with at least 2 liters of water. Eat light meals such as soups to replenish electrolytes and soft fruits, veggies. Start an antihistamine like Zyrtec (cetirizine) at 10mg daily for postnasal drainage, sinus congestion.  

## 2019-04-14 ENCOUNTER — Telehealth (HOSPITAL_COMMUNITY): Payer: Self-pay

## 2019-04-29 ENCOUNTER — Other Ambulatory Visit: Payer: Self-pay

## 2019-04-29 ENCOUNTER — Ambulatory Visit (HOSPITAL_COMMUNITY)
Admission: EM | Admit: 2019-04-29 | Discharge: 2019-04-29 | Disposition: A | Discharge status: Home / Self Care | Payer: Medicaid Other | Attending: Family Medicine | Admitting: Family Medicine

## 2019-04-29 ENCOUNTER — Encounter (HOSPITAL_COMMUNITY): Payer: Self-pay

## 2019-04-29 DIAGNOSIS — Z20822 Contact with and (suspected) exposure to covid-19: Secondary | ICD-10-CM | POA: Insufficient documentation

## 2019-04-29 NOTE — ED Triage Notes (Signed)
Pt states her son was exposed to COVID positive person approx 2 weeks ago. Son and pt deny any URI sx, abdom pain, n/v/d, fever, chills. Pt states she needs COVID test for work.

## 2019-04-29 NOTE — Discharge Instructions (Addendum)
Please monitor MyChart for COVID swab results

## 2019-04-29 NOTE — ED Provider Notes (Signed)
[facility]    CSN: [id number] Arrival date & time: 04/29/19  0850      History   Chief Complaint Chief Complaint  Patient presents with  . COVID test    HPI Sierra Evans is a 29 y.o. female history of asthma presenting today for Covid testing after exposure.  Son tested positive for Covid, but was asymptomatic during quarantine. she has been asymptomatic.  Denies URI symptoms.  Denies GI symptoms.  Needing note to return to work. HPI  Past Medical History:  Diagnosis Date  . Asthma   . Scoliosis     Patient Active Problem List   Diagnosis Date Noted  . Nausea and vomiting during pregnancy prior to [redacted] weeks gestation 07/08/2014    Past Surgical History:  Procedure Laterality Date  . cervix scrape Bilateral   . CESAREAN SECTION      OB History    Gravida  4   Para  2   Term  2   Preterm      AB  0   Living  2     SAB      TAB      Ectopic  0   Multiple      Live Births  2            Home Medications    Prior to Admission medications   Medication Sig Start Date End Date Taking? Authorizing Provider  albuterol (PROVENTIL HFA;VENTOLIN HFA) 108 (90 BASE) MCG/ACT inhaler Inhale 1 puff into the lungs every 6 (six) hours as needed for wheezing or shortness of breath.    [provider]  cephALEXin (KEFLEX) 500 MG capsule Take 1 capsule (500 mg total) by mouth 4 (four) times daily. 10/07/16   Margarita Grizzle, MD  Doxylamine-Pyridoxine (DICLEGIS) 10-10 MG TBEC Take 1 tablet by mouth 2 (two) times daily. 07/08/14   Poe, Deirdre C, CNM  loratadine (CLARITIN) 10 MG tablet Take 10 mg by mouth daily as needed.    [provider]  polyethylene glycol (MIRALAX / GLYCOLAX) packet Take 17 g by mouth daily. Patient taking differently: Take 17 g by mouth daily as needed for mild constipation.  02/26/14   Loren Racer, MD  promethazine (PHENERGAN) 12.5 MG suppository Place 1 suppository (12.5 mg total) rectally every 6 (six)  hours as needed for nausea or vomiting. 07/08/14   Poe, Deirdre Salena Saner, CNM    Family History Family History  Problem Relation Age of Onset  . Alcohol abuse Mother   . Cirrhosis Mother   . COPD Father   . Cancer Brother   . Cancer Maternal Grandfather   . Stroke Maternal Grandfather   . Hypertension Maternal Grandfather   . Cancer Paternal Grandmother     Social History Social History   Tobacco Use  . Smoking status: Never Smoker  . Smokeless tobacco: Never Used  Substance Use Topics  . Alcohol use: No  . Drug use: No     Allergies   Codeine and Strawberry extract   Review of Systems Review of Systems  Constitutional: Negative for activity change, appetite change, chills, fatigue and fever.  HENT: Negative for congestion, ear pain, rhinorrhea, sinus pressure, sore throat and trouble swallowing.   Eyes: Negative for discharge and redness.  Respiratory: Negative for cough, chest tightness and shortness of breath.   Cardiovascular: Negative for chest pain.  Gastrointestinal: Negative for abdominal pain, diarrhea, nausea and vomiting.  Musculoskeletal: Negative for myalgias.  Skin: Negative for  rash.  Neurological: Negative for dizziness, light-headedness and headaches.     Physical Exam Triage Vital Signs ED Triage Vitals  Enc Vitals Group     BP 04/29/19 0940 117/78     Pulse Rate 04/29/19 0940 68     Resp 04/29/19 0940 20     Temp 04/29/19 0940 98.2 F (36.8 C)     Temp Source 04/29/19 0940 Oral     SpO2 04/29/19 0940 100 %     Weight --      Height --      Head Circumference --      Peak Flow --      Pain Score 04/29/19 0934 0     Pain Loc --      Pain Edu? --      Excl. in [place]? --    No data found.  Updated Vital Signs BP 117/78 (BP Location: Left Arm)   Pulse 68   Temp 98.2 F (36.8 C) (Oral)   Resp 20   LMP 04/25/2019   SpO2 100%   Visual Acuity Right Eye Distance:   Left Eye Distance:   Bilateral Distance:    Right Eye Near:   Left Eye  Near:    Bilateral Near:     Physical Exam Vitals and nursing note reviewed.  Constitutional:      Appearance: She is well-developed.     Comments: No acute distress  HENT:     Head: Normocephalic and atraumatic.     Nose: Nose normal.  Eyes:     Conjunctiva/sclera: Conjunctivae normal.  Cardiovascular:     Rate and Rhythm: Normal rate.  Pulmonary:     Effort: Pulmonary effort is normal. No respiratory distress.  Abdominal:     General: There is no distension.  Musculoskeletal:        General: Normal range of motion.     Cervical back: Neck supple.  Skin:    General: Skin is warm and dry.  Neurological:     Mental Status: She is alert and oriented to person, place, and time.      UC Treatments / Results  Labs (all labs ordered are listed, but only abnormal results are displayed) Labs Reviewed  SARS CORONAVIRUS 2 (TAT 6-24 HRS)    EKG   Radiology No results found.  Procedures Procedures (including critical care time)  Medications Ordered in UC Medications - No data to display  Initial Impression / Assessment and Plan / UC Course  I have reviewed the triage vital signs and the nursing notes.  Pertinent labs & imaging results that were available during my care of the patient were reviewed by me and considered in my medical decision making (see chart for details).     Covid PCR pending, currently asymptomatic.  May return to work pending negative Covid test.  Discussed strict return precautions. Patient verbalized understanding and is agreeable with plan.  Final Clinical Impressions(s) / UC Diagnoses   Final diagnoses:  Encounter for laboratory testing for COVID-19 virus  Exposure to COVID-19 virus     Discharge Instructions     Please monitor MyChart for COVID swab results    ED Prescriptions    None     PDMP not reviewed this encounter.   Bernadean Saling, [place] C, PA-C 04/29/19 1001

## 2019-04-30 ENCOUNTER — Telehealth (HOSPITAL_COMMUNITY): Payer: Self-pay

## 2019-04-30 LAB — SARS CORONAVIRUS 2 (TAT 6-24 HRS): SARS Coronavirus 2: NEGATIVE

## 2019-04-30 NOTE — Telephone Encounter (Signed)
Pt l/m req COVID result.  Attempted to c/b.  No answer, l/m.

## 2019-05-20 ENCOUNTER — Ambulatory Visit (HOSPITAL_COMMUNITY)
Admission: EM | Admit: 2019-05-20 | Discharge: 2019-05-20 | Disposition: A | Discharge status: Home / Self Care | Payer: Medicaid Other | Attending: Family Medicine | Admitting: Family Medicine

## 2019-05-20 ENCOUNTER — Encounter (HOSPITAL_COMMUNITY): Payer: Self-pay

## 2019-05-20 ENCOUNTER — Other Ambulatory Visit: Payer: Self-pay

## 2019-05-20 DIAGNOSIS — M419 Scoliosis, unspecified: Secondary | ICD-10-CM | POA: Diagnosis not present

## 2019-05-20 DIAGNOSIS — R531 Weakness: Secondary | ICD-10-CM | POA: Insufficient documentation

## 2019-05-20 DIAGNOSIS — R42 Dizziness and giddiness: Secondary | ICD-10-CM

## 2019-05-20 DIAGNOSIS — R5383 Other fatigue: Secondary | ICD-10-CM | POA: Diagnosis not present

## 2019-05-20 DIAGNOSIS — R112 Nausea with vomiting, unspecified: Secondary | ICD-10-CM

## 2019-05-20 DIAGNOSIS — J45909 Unspecified asthma, uncomplicated: Secondary | ICD-10-CM | POA: Insufficient documentation

## 2019-05-20 DIAGNOSIS — Z20822 Contact with and (suspected) exposure to covid-19: Secondary | ICD-10-CM | POA: Insufficient documentation

## 2019-05-20 DIAGNOSIS — R197 Diarrhea, unspecified: Secondary | ICD-10-CM | POA: Diagnosis not present

## 2019-05-20 LAB — CBG MONITORING, ED: Glucose-Capillary: 101 mg/dL — ABNORMAL HIGH (ref 70–99)

## 2019-05-20 LAB — HCG, QUANTITATIVE, PREGNANCY: hCG, Beta Chain, Quant, S: <1 m[IU]/mL (ref <5)

## 2019-05-20 MED ORDER — ONDANSETRON 4 MG PO TBDP
4.0000 mg | ORAL_TABLET | Freq: Three times a day (TID) | ORAL | 0 refills | Status: DC | PRN
Start: 2019-05-20 — End: 2019-07-18

## 2019-05-20 MED ORDER — ONDANSETRON 4 MG PO TBDP
4.0000 mg | ORAL_TABLET | Freq: Once | ORAL | Status: AC
  Administered 2019-05-20: 16:00:00 4 mg via ORAL

## 2019-05-20 MED ORDER — ONDANSETRON 4 MG PO TBDP
ORAL_TABLET | ORAL | Status: AC
  Filled 2019-05-20: qty 1

## 2019-05-20 NOTE — Discharge Instructions (Addendum)
Please do your best to ensure adequate fluid intake in order to avoid dehydration. If you find that you are unable to tolerate drinking fluids regularly please proceed to the Emergency Department for evaluation.  Also, you should return to the hospital if you experience persistent fevers for greater than 1-2 more days, increasing abdominal pain that persists despite medications, persistent diarrhea, dizziness, syncope (fainting), or for any other concerns you may deem worrisome.  You have been tested for COVID-19 today. If your test returns positive, you will receive a phone call from [facility] regarding your results. Negative test results are not called. Both positive and negative results area always visible on MyChart. If you do not have a MyChart account, sign up instructions are provided in your discharge papers. Please do not hesitate to contact us should you have questions or concerns.  

## 2019-05-20 NOTE — ED Provider Notes (Signed)
[facility]   [id number] 05/20/19 Arrival Time: 1451  ASSESSMENT & PLAN:  1. Lightheadedness   2. Intractable vomiting with nausea, unspecified vomiting type     She is unable to provide urine sample. Question early viral illness. Unclear at this time.  Meds ordered this encounter  Medications  . ondansetron (ZOFRAN-ODT) disintegrating tablet 4 mg  . ondansetron (ZOFRAN-ODT) 4 MG disintegrating tablet    Sig: Take 1 tablet (4 mg total) by mouth every 8 (eight) hours as needed for nausea or vomiting.    Dispense:  15 tablet    Refill:  0     Pending: HCG, QUANTITATIVE, PREGNANCY     Discharge Instructions     Please do your best to ensure adequate fluid intake in order to avoid dehydration. If you find that you are unable to tolerate drinking fluids regularly please proceed to the Emergency Department for evaluation.  Also, you should return to the hospital if you experience persistent fevers for greater than 1-2 more days, increasing abdominal pain that persists despite medications, persistent diarrhea, dizziness, syncope (fainting), or for any other concerns you may deem worrisome.  You have been tested for COVID-19 today. If your test returns positive, you will receive a phone call from [facility] regarding your results. Negative test results are not called. Both positive and negative results area always visible on MyChart. If you do not have a MyChart account, sign up instructions are provided in your discharge papers. Please do not hesitate to contact us should you have questions or concerns.      COVID testing sent. Work note provided. Discussed typical duration of symptoms for suspected viral GI illness. Will do her best to ensure adequate fluid intake in order to avoid dehydration. Will proceed to the Emergency Department for evaluation if unable to tolerate PO fluids regularly.  Otherwise she will f/u with her PCP or here if not showing improvement  over the next 48-72 hours.  Reviewed expectations re: course of current medical issues. Questions answered. Outlined signs and symptoms indicating need for more acute intervention. Patient verbalized understanding. After Visit Summary given.   SUBJECTIVE: History from: patient.  Sierra Evans is a 29 y.o. female who presents with complaint of non-bilious, non-bloody intermittent n/v without non-bloody diarrhea. Onset today; abrupt; approx 3 hours ago. Abdominal discomfort: none. "Just feel really bad. Feel weak and lightheaded." Aggravating factors: none identified. Alleviating factors: none identified. Associated symptoms: fatigue. She denies arthralgias, constipation, dysuria, headache, myalgias and sweats. Afebrile. Appetite: decreased. PO intake: decreased. Ambulatory without assistance. Urinary symptoms: none. Sick contacts: none. Works from home. Recent travel or camping: none. OTC treatment: none.  Patient's last menstrual period was 04/25/2019.  Past Surgical History:  Procedure Laterality Date  . cervix scrape Bilateral   . CESAREAN SECTION       OBJECTIVE:  Vitals:   05/20/19 1513 05/20/19 1514 05/20/19 1525  BP: (!) 133/111  131/79  Pulse: 84    Resp: 20    Temp: 98.2 F (36.8 C)    TempSrc: Oral    SpO2: 96%    Weight:  81.6 kg   Height:  5\' 2"  (1.575 m)     General appearance: alert; no distress Lungs: clear to auscultation bilaterally; unlabored Heart: regular rate and rhythm Abdomen: soft; non-distended; no significant abdominal tenderness; no masses or organomegaly; no guarding or rebound tenderness Back: no CVA tenderness Extremities: no edema; symmetrical with no gross deformities Skin: warm; dry Neurologic: normal gait Psychological:  alert and cooperative; normal mood and affect  Labs:  Labs Reviewed  SARS CORONAVIRUS 2 (TAT 6-24 HRS)  HCG, QUANTITATIVE, PREGNANCY  CBG MONITORING, ED    Allergies  Allergen Reactions  . Codeine Other  (See Comments)    PLEASE VERIFY REACTION.    Marland Kitchen Strawberry Extract Hives                                               Past Medical History:  Diagnosis Date  . Asthma   . Scoliosis    Social History   Socioeconomic History  . Marital status: Significant Other    Spouse name: Not on file  . Number of children: Not on file  . Years of education: Not on file  . Highest education level: Not on file  Occupational History  . Not on file  Tobacco Use  . Smoking status: Never Smoker  . Smokeless tobacco: Never Used  Substance and Sexual Activity  . Alcohol use: No  . Drug use: No  . Sexual activity: Yes    Birth control/protection: None  Other Topics Concern  . Not on file  Social History Narrative  . Not on file   Social Determinants of Health   Financial Resource Strain:   . Difficulty of Paying Living Expenses:   Food Insecurity:   . Worried About Charity fundraiser in the Last Year:   . Arboriculturist in the Last Year:   Transportation Needs:   . Film/video editor (Medical):   Marland Kitchen Lack of Transportation (Non-Medical):   Physical Activity:   . Days of Exercise per Week:   . Minutes of Exercise per Session:   Stress:   . Feeling of Stress :   Social Connections:   . Frequency of Communication with Friends and Family:   . Frequency of Social Gatherings with Friends and Family:   . Attends Religious Services:   . Active Member of Clubs or Organizations:   . Attends Archivist Meetings:   Marland Kitchen Marital Status:   Intimate Partner Violence:   . Fear of Current or Ex-Partner:   . Emotionally Abused:   Marland Kitchen Physically Abused:   . Sexually Abused:    Family History  Problem Relation Age of Onset  . Alcohol abuse Mother   . Cirrhosis Mother   . COPD Father   . Cancer Brother   . Cancer Maternal Grandfather   . Stroke Maternal Grandfather   . Hypertension Maternal Grandfather   . Cancer Paternal Izetta Dakin, MD 05/21/19 1007

## 2019-05-20 NOTE — ED Triage Notes (Signed)
Pt c/o dizzy, N/V, left hand numbness, right foot numbness started today.

## 2019-05-21 LAB — SARS CORONAVIRUS 2 (TAT 6-24 HRS): SARS Coronavirus 2: NEGATIVE

## 2019-07-18 ENCOUNTER — Ambulatory Visit (INDEPENDENT_AMBULATORY_CARE_PROVIDER_SITE_OTHER): Payer: Self-pay

## 2019-07-18 ENCOUNTER — Other Ambulatory Visit: Payer: Self-pay

## 2019-07-18 ENCOUNTER — Ambulatory Visit (HOSPITAL_COMMUNITY)
Admission: EM | Admit: 2019-07-18 | Discharge: 2019-07-18 | Disposition: A | Discharge status: Home / Self Care | Payer: Self-pay | Attending: Urgent Care | Admitting: Urgent Care

## 2019-07-18 ENCOUNTER — Encounter (HOSPITAL_COMMUNITY): Payer: Self-pay

## 2019-07-18 DIAGNOSIS — M79672 Pain in left foot: Secondary | ICD-10-CM

## 2019-07-18 DIAGNOSIS — S86912A Strain of unspecified muscle(s) and tendon(s) at lower leg level, left leg, initial encounter: Secondary | ICD-10-CM

## 2019-07-18 DIAGNOSIS — M25572 Pain in left ankle and joints of left foot: Secondary | ICD-10-CM

## 2019-07-18 DIAGNOSIS — M79662 Pain in left lower leg: Secondary | ICD-10-CM

## 2019-07-18 DIAGNOSIS — M25472 Effusion, left ankle: Secondary | ICD-10-CM

## 2019-07-18 MED ORDER — NAPROXEN 500 MG PO TABS
500.0000 mg | ORAL_TABLET | Freq: Two times a day (BID) | ORAL | 0 refills | Status: DC
Start: 2019-07-18 — End: 2019-07-31

## 2019-07-18 MED ORDER — TIZANIDINE HCL 4 MG PO TABS: 4.0000 mg | ORAL_TABLET | Freq: Three times a day (TID) | ORAL | 0 refills | Status: DC | PRN

## 2019-07-18 NOTE — ED Provider Notes (Addendum)
[facility]   MRN: [id number] DOB: [birth year]  Subjective:   Sierra Evans is a 29 y.o. female presenting for 1 month hx of persistent and worsening left lower leg/calf pain, left ankle pain and swelling. Denies falls, trauma, history of dvt. Does not do strenuous physical activities, has sedentary job. LMP was 07/15/2019.   No current facility-administered medications for this encounter.  Current Outpatient Medications:  .  albuterol (PROVENTIL HFA;VENTOLIN HFA) 108 (90 BASE) MCG/ACT inhaler, Inhale 1 puff into the lungs every 6 (six) hours as needed for wheezing or shortness of breath. (Patient not taking: Reported on 07/18/2019), Disp: , Rfl:    Allergies  Allergen Reactions  . Codeine Other (See Comments)    PLEASE VERIFY REACTION.    Marland Kitchen Strawberry Extract Hives    Past Medical History:  Diagnosis Date  . Asthma   . Scoliosis      Past Surgical History:  Procedure Laterality Date  . cervix scrape Bilateral   . CESAREAN SECTION      Family History  Problem Relation Age of Onset  . Alcohol abuse Mother   . Cirrhosis Mother   . COPD Father   . Cancer Brother   . Cancer Maternal Grandfather   . Stroke Maternal Grandfather   . Hypertension Maternal Grandfather   . Cancer Paternal Grandmother     Social History   Tobacco Use  . Smoking status: Never Smoker  . Smokeless tobacco: Never Used  Substance Use Topics  . Alcohol use: No  . Drug use: No    ROS   Objective:   Vitals: BP 109/64 (BP Location: Left Arm)   Pulse 67   Temp 98.2 F (36.8 C) (Oral)   Resp 16   LMP 07/15/2019 (Approximate)   SpO2 99%   Physical Exam Constitutional:      General: She is not in acute distress.    Appearance: Normal appearance. She is well-developed. She is not ill-appearing, toxic-appearing or diaphoretic.  HENT:     Head: Normocephalic and atraumatic.     Nose: Nose normal.     Mouth/Throat:     Mouth: Mucous membranes are moist.     Pharynx:  Oropharynx is clear.  Eyes:     General: No scleral icterus.       Right eye: No discharge.        Left eye: No discharge.     Extraocular Movements: Extraocular movements intact.     Conjunctiva/sclera: Conjunctivae normal.     Pupils: Pupils are equal, round, and reactive to light.  Cardiovascular:     Rate and Rhythm: Normal rate.  Pulmonary:     Effort: Pulmonary effort is normal.  Musculoskeletal:     Left lower leg: Tenderness present. No swelling, deformity, lacerations or bony tenderness. No edema.     Left ankle: No swelling, deformity, ecchymosis or lacerations. Tenderness present over the lateral malleolus and medial malleolus. Decreased range of motion.     Left Achilles Tendon: Tenderness present. No defects. Thompson's test negative.  Skin:    General: Skin is warm and dry.  Neurological:     General: No focal deficit present.     Mental Status: She is alert and oriented to person, place, and time.  Psychiatric:        Mood and Affect: Mood normal.        Behavior: Behavior normal.        Thought Content: Thought content normal.  Judgment: Judgment normal.    DG Ankle Complete Left  Result Date: 07/18/2019 CLINICAL DATA:  Left ankle pain and swelling for 1 month, no known injury, initial encounter EXAM: LEFT ANKLE COMPLETE - 3+ VIEW COMPARISON:  None. FINDINGS: Generalized soft tissue swelling is noted. No acute fracture or dislocation is seen. Mild calcaneal spurs are noted. IMPRESSION: Soft tissue swelling without acute bony abnormality. Electronically Signed   By: Alcide Clever M.D.   On: 07/18/2019 14:33   Assessment and Plan :   PDMP not reviewed this encounter.  1. Muscle strain of left lower leg, initial encounter   2. Acute left ankle pain   3. Pain of left calf   4. Left foot pain     Will manage conservatively for muscle strain with NSAID, rest.  Recommend the patient use compression sock.  Hold off on ultrasound for DVT given lack of erythema,  warmth and swelling of the calf and no risk factors for this. Counseled patient on potential for adverse effects with medications prescribed/recommended today, ER and return-to-clinic precautions discussed, patient verbalized understanding.    Wallis Bamberg, [place] 07/18/19 1651

## 2019-07-18 NOTE — ED Triage Notes (Signed)
Pt presents to UC for swelling in left ankle and foot x1 month. Pt states swelling is worse in the morning and pain radiates from foot to calf. Pt states she has treated with tylenol, with out relief. Pt has tried elevation, heat and ice with out relief. Pt denies trauma or injury to left lower extremity. Pt has full range of motion with pain. Pt able to ambulate into treatment space unassisted with even steady gait.

## 2019-07-31 ENCOUNTER — Other Ambulatory Visit: Payer: Self-pay

## 2019-07-31 ENCOUNTER — Ambulatory Visit (HOSPITAL_COMMUNITY)
Admission: EM | Admit: 2019-07-31 | Discharge: 2019-07-31 | Disposition: A | Discharge status: Home / Self Care | Payer: Self-pay | Attending: Internal Medicine | Admitting: Internal Medicine

## 2019-07-31 ENCOUNTER — Encounter (HOSPITAL_COMMUNITY): Payer: Self-pay

## 2019-07-31 DIAGNOSIS — M62830 Muscle spasm of back: Secondary | ICD-10-CM

## 2019-07-31 MED ORDER — BACLOFEN 10 MG PO TABS
10.0000 mg | ORAL_TABLET | Freq: Three times a day (TID) | ORAL | 0 refills | Status: DC | PRN
Start: 2019-07-31 — End: 2020-11-23
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
## 2019-11-06 ENCOUNTER — Emergency Department (HOSPITAL_COMMUNITY)
Admission: EM | Admit: 2019-11-06 | Discharge: 2019-11-07 | Disposition: A | Discharge status: Home / Self Care | Payer: Medicaid Other | Attending: Emergency Medicine | Admitting: Emergency Medicine

## 2019-11-06 DIAGNOSIS — J45909 Unspecified asthma, uncomplicated: Secondary | ICD-10-CM | POA: Insufficient documentation

## 2019-11-06 DIAGNOSIS — R040 Epistaxis: Secondary | ICD-10-CM

## 2019-11-06 DIAGNOSIS — Z79899 Other long term (current) drug therapy: Secondary | ICD-10-CM | POA: Insufficient documentation

## 2019-11-07 ENCOUNTER — Other Ambulatory Visit: Payer: Self-pay

## 2019-11-07 ENCOUNTER — Encounter (HOSPITAL_COMMUNITY): Payer: Self-pay | Admitting: Emergency Medicine

## 2019-11-07 MED ORDER — AFRIN NASAL SPRAY 0.05 % NA SOLN
1.0000 | NASAL | 0 refills | Status: DC | PRN
Start: 2019-11-07 — End: 2019-11-07

## 2019-11-07 MED ORDER — AFRIN NASAL SPRAY 0.05 % NA SOLN
1.0000 | NASAL | 0 refills | Status: DC | PRN
Start: 2019-11-07 — End: 2020-11-23

## 2019-11-07 NOTE — ED Triage Notes (Signed)
Pt to ED via [facility] with c/o nosebleed off and on all day.  Pt st's right nostril has been bleeding more than left.  No active bleeding at this time

## 2019-11-07 NOTE — Discharge Instructions (Addendum)
1. Medications: Afrin, usual home medications 2. Treatment: rest, drink plenty of fluids, if nose begins to bleed again - blow nose, spray afrin, hold firm pressure minimum of 10 minutes  3. Follow Up: Please followup with your primary doctor in 2-3 days for discussion of your diagnoses and further evaluation after today's visit; Please return to the ER for nosebleeds you are unable to stop

## 2019-11-07 NOTE — ED Provider Notes (Signed)
[facility] EMERGENCY DEPARTMENT Provider Note   CSN: [id number] Arrival date & time: 11/06/19  2359     History Chief Complaint  Patient presents with   Nosebleed    Sierra Evans is a 29 y.o. female with a hx of asthma presents to the Emergency Department complaining of intermittent nosebleeds onset earlier today.  Patient denies known trauma, regular nasal spray usage, foreign body, thinners or history of hypertension.  Patient reports she attempted to pack her nose with toilet paper without long-term resolution of bleeding.  She reports that every time she removes the toilet paper bleeding would begin again.  No specific aggravating or alleviating factors.  No recent illness, cough, nasal congestion.  The history is provided by the patient and medical records. No language interpreter was used.       Past Medical History:  Diagnosis Date   Asthma    Scoliosis     Patient Active Problem Evans   Diagnosis Date Noted   Nausea and vomiting during pregnancy prior to [redacted] weeks gestation 07/08/2014    Past Surgical History:  Procedure Laterality Date   cervix scrape Bilateral    CESAREAN SECTION       OB History    Gravida  4   Para  2   Term  2   Preterm      AB  0   Living  2     SAB      TAB      Ectopic  0   Multiple      Live Births  2           Family History  Problem Relation Age of Onset   Alcohol abuse Mother    Cirrhosis Mother    COPD Father    Cancer Brother    Cancer Maternal Grandfather    Stroke Maternal Grandfather    Hypertension Maternal Grandfather    Cancer Paternal Grandmother     Social History   Tobacco Use   Smoking status: Never Smoker   Smokeless tobacco: Never Used  Substance Use Topics   Alcohol use: No   Drug use: No    Home Medications Prior to Admission medications   Medication Sig Start Date End Date Taking? Authorizing Provider  baclofen (LIORESAL) 10 MG  tablet Take 1 tablet (10 mg total) by mouth 3 (three) times daily as needed for muscle spasms. 07/31/19   Merrilee Jansky, MD  ibuprofen (ADVIL) 800 MG tablet Take by mouth. 07/27/19   [provider]  oxymetazoline (AFRIN NASAL SPRAY) 0.05 % nasal spray Place 1 spray into both nostrils as needed (nosebleeds). 11/07/19   Jeanmarc Viernes, Dahlia Client, PA-C  tiZANidine (ZANAFLEX) 4 MG tablet Take 1 tablet (4 mg total) by mouth every 8 (eight) hours as needed. 07/18/19   Wallis Bamberg, PA-C  albuterol (PROVENTIL HFA;VENTOLIN HFA) 108 (90 BASE) MCG/ACT inhaler Inhale 1 puff into the lungs every 6 (six) hours as needed for wheezing or shortness of breath. Patient not taking: Reported on 07/18/2019  07/31/19  [provider]  ferrous sulfate 325 (65 FE) MG tablet Take by mouth.  07/31/19  [provider]  loratadine (CLARITIN) 10 MG tablet Take by mouth.  07/31/19  [provider]    Allergies    Codeine, Hydrocodone, and Strawberry extract  Review of Systems   Review of Systems  Constitutional: Negative for chills and fever.  HENT: Positive for nosebleeds. Negative for congestion, sinus pressure, sinus  pain, sneezing and sore throat.   Eyes: Negative for pain and visual disturbance.  Respiratory: Negative for cough and shortness of breath.   Gastrointestinal: Negative for abdominal pain, nausea and vomiting.  Skin: Negative for rash and wound.  Neurological: Negative for headaches.    Physical Exam Updated Vital Signs BP 106/88 (BP Location: Left Arm)    Pulse 61    Temp 98.5 F (36.9 C) (Oral)    Resp (!) 22    Ht 5\' 2"  (1.575 m)    Wt 90.7 kg    LMP 11/02/2019 (Exact Date)    SpO2 100%    BMI 36.58 kg/m   Physical Exam Vitals and nursing note reviewed.  Constitutional:      General: She is not in acute distress.    Appearance: She is well-developed.  HENT:     Head: Normocephalic and atraumatic.     Jaw: There is normal jaw occlusion.     Salivary Glands: Right  salivary gland is not diffusely enlarged or tender. Left salivary gland is not diffusely enlarged or tender.     Right Ear: External ear normal.     Left Ear: External ear normal.     Nose: No congestion or rhinorrhea.     Right Nostril: Epistaxis present. No foreign body, septal hematoma or occlusion.     Left Nostril: No foreign body, epistaxis, septal hematoma or occlusion.     Right Turbinates: Not enlarged.     Left Turbinates: Not enlarged.     Right Sinus: No maxillary sinus tenderness or frontal sinus tenderness.     Left Sinus: No maxillary sinus tenderness or frontal sinus tenderness.     Comments: Small abrasion to the septum of the right nare.  No ulceration or hematoma.  No persistent bleeding. Eyes:     General: No scleral icterus.    Conjunctiva/sclera: Conjunctivae normal.  Cardiovascular:     Rate and Rhythm: Normal rate.  Pulmonary:     Effort: Pulmonary effort is normal.  Musculoskeletal:        General: Normal range of motion.     Cervical back: Normal range of motion.  Skin:    General: Skin is warm and dry.  Neurological:     Mental Status: She is alert.     ED Results / Procedures / Treatments    Procedures Procedures (including critical care time)  Medications Ordered in ED Medications - No data to display  ED Course  I have reviewed the triage vital signs and the nursing notes.  Pertinent labs & imaging results that were available during my care of the patient were reviewed by me and considered in my medical decision making (see chart for details).  Clinical Course as of Nov 07 403  Fri Nov 07, 2019  0357 Improved without intervention  BP: 106/88 [HM]    Clinical Course User Index [HM] Shaddai Shapley, 07-05-1985   MDM Rules/Calculators/A&P                           Patient presents with intermittent epistaxis.  She was slightly hypertensive on arrival however patient was very anxious at that time.  Blood pressure has resolved without  intervention.  No history of hypertension.  Small abrasion noted to the septum of the right nare.  No ulceration.  Patient does not regularly use steroids.  She is not a diabetic.  No persistent bleeding.  At this time I do  not believe that patient needs nasal packing.  Discussed epistaxis management at home, close primary care follow-up and reasons to return to the emergency department.  Patient states understanding and is in agreement with the plan.   Final Clinical Impression(s) / ED Diagnoses Final diagnoses:  Epistaxis    Rx / DC Orders ED Discharge Orders         Ordered    oxymetazoline (AFRIN NASAL SPRAY) 0.05 % nasal spray  As needed,   Status:  Discontinued        11/07/19 0357    oxymetazoline (AFRIN NASAL SPRAY) 0.05 % nasal spray  As needed        11/07/19 0405           Lodie Waheed, Boyd Kerbs 11/07/19 0405    Cardama, Amadeo Garnet, MD 11/08/19 [id number]

## 2019-11-07 NOTE — ED Triage Notes (Signed)
Pt was at home today and has been having a nose bleed off and on today. Pt saying there was significant amount earlier. Upon medic arrival she was given Afrin and slowed the nose bleed down. Once ems arrived the bleeding had stopped.

## 2019-11-26 ENCOUNTER — Encounter (HOSPITAL_COMMUNITY): Payer: Self-pay

## 2019-11-26 ENCOUNTER — Ambulatory Visit (HOSPITAL_COMMUNITY)
Admission: EM | Admit: 2019-11-26 | Discharge: 2019-11-26 | Disposition: A | Discharge status: Home / Self Care | Payer: Self-pay | Attending: Physician Assistant | Admitting: Physician Assistant

## 2019-11-26 ENCOUNTER — Other Ambulatory Visit: Payer: Self-pay

## 2019-11-26 DIAGNOSIS — R21 Rash and other nonspecific skin eruption: Secondary | ICD-10-CM

## 2019-11-26 MED ORDER — PREDNISONE 10 MG PO TABS
ORAL_TABLET | ORAL | 0 refills | Status: DC
Start: 2019-11-26 — End: 2020-11-23

## 2019-11-26 NOTE — ED Provider Notes (Signed)
[facility]    CSN: [id number] Arrival date & time: 11/26/19  0805      History   Chief Complaint Chief Complaint  Patient presents with  . Rash    HPI Sierra Evans is a 29 y.o. female.   The history is provided by the patient. No language interpreter was used.  Rash Location:  Full body Quality: itchiness and redness   Severity:  Moderate Onset quality:  Gradual Timing:  Constant Progression:  Worsening Chronicity:  New Context comment:  Dirty carpet Relieved by:  Nothing Worsened by:  Nothing Ineffective treatments:  None tried Associated symptoms: no sore throat     Past Medical History:  Diagnosis Date  . Asthma   . Scoliosis     Patient Active Problem List   Diagnosis Date Noted  . Nausea and vomiting during pregnancy prior to [redacted] weeks gestation 07/08/2014    Past Surgical History:  Procedure Laterality Date  . cervix scrape Bilateral   . CESAREAN SECTION      OB History    Gravida  4   Para  2   Term  2   Preterm      AB  0   Living  2     SAB      TAB      Ectopic  0   Multiple      Live Births  2            Home Medications    Prior to Admission medications   Medication Sig Start Date End Date Taking? Authorizing Provider  baclofen (LIORESAL) 10 MG tablet Take 1 tablet (10 mg total) by mouth 3 (three) times daily as needed for muscle spasms. 07/31/19   Merrilee Jansky, MD  ibuprofen (ADVIL) 800 MG tablet Take by mouth. 07/27/19   [provider]  oxymetazoline (AFRIN NASAL SPRAY) 0.05 % nasal spray Place 1 spray into both nostrils as needed (nosebleeds). 11/07/19   Muthersbaugh, Dahlia Client, PA-C  predniSONE (DELTASONE) 10 MG tablet 6,6,5,,5,4,4,3,3,2,2,1,1 taper 11/26/19   Cheron Schaumann K, PA-C  tiZANidine (ZANAFLEX) 4 MG tablet Take 1 tablet (4 mg total) by mouth every 8 (eight) hours as needed. 07/18/19   Wallis Bamberg, PA-C  albuterol (PROVENTIL HFA;VENTOLIN HFA) 108 (90 BASE) MCG/ACT inhaler  Inhale 1 puff into the lungs every 6 (six) hours as needed for wheezing or shortness of breath. Patient not taking: Reported on 07/18/2019  07/31/19  [provider]  ferrous sulfate 325 (65 FE) MG tablet Take by mouth.  07/31/19  [provider]  loratadine (CLARITIN) 10 MG tablet Take by mouth.  07/31/19  [provider]    Family History Family History  Problem Relation Age of Onset  . Alcohol abuse Mother   . Cirrhosis Mother   . COPD Father   . Cancer Brother   . Cancer Maternal Grandfather   . Stroke Maternal Grandfather   . Hypertension Maternal Grandfather   . Cancer Paternal Grandmother     Social History Social History   Tobacco Use  . Smoking status: Never Smoker  . Smokeless tobacco: Never Used  Substance Use Topics  . Alcohol use: No  . Drug use: No     Allergies   Codeine, Hydrocodone, and Strawberry extract   Review of Systems Review of Systems  HENT: Negative for sore throat.   Skin: Positive for rash.  All other systems reviewed and are negative.    Physical Exam Triage Vital  Signs ED Triage Vitals  Enc Vitals Group     BP 11/26/19 0826 123/85     Pulse Rate 11/26/19 0826 86     Resp 11/26/19 0826 19     Temp 11/26/19 0836 98.4 F (36.9 C)     Temp Source 11/26/19 0826 Oral     SpO2 11/26/19 0826 98 %     Weight --      Height --      Head Circumference --      Peak Flow --      Pain Score 11/26/19 0911 0     Pain Loc --      Pain Edu? --      Excl. in GC? --    No data found.  Updated Vital Signs BP 123/85 (BP Location: Left Arm)   Pulse 86   Temp 98.4 F (36.9 C) (Oral)   Resp 19   LMP 11/02/2019 (Exact Date)   SpO2 98%   Visual Acuity Right Eye Distance:   Left Eye Distance:   Bilateral Distance:    Right Eye Near:   Left Eye Near:    Bilateral Near:     Physical Exam Vitals and nursing note reviewed.  Constitutional:      Appearance: She is well-developed.  HENT:     Head: Normocephalic.   Cardiovascular:     Rate and Rhythm: Normal rate.  Pulmonary:     Effort: Pulmonary effort is normal.  Abdominal:     General: There is no distension.  Musculoskeletal:        General: Normal range of motion.     Cervical back: Normal range of motion.  Skin:    Findings: Rash present.     Comments: Erythematous raised rash right antecubital.  Legs multiple red raised linear areas   Neurological:     General: No focal deficit present.     Mental Status: She is alert and oriented to person, place, and time.  Psychiatric:        Mood and Affect: Mood normal.      UC Treatments / Results  Labs (all labs ordered are listed, but only abnormal results are displayed) Labs Reviewed - No data to display  EKG   Radiology No results found.  Procedures Procedures (including critical care time)  Medications Ordered in UC Medications - No data to display  Initial Impression / Assessment and Plan / UC Course  I have reviewed the triage vital signs and the nursing notes.  Pertinent labs & imaging results that were available during my care of the patient were reviewed by me and considered in my medical decision making (see chart for details).     MDM: Rash looks like contact,  Pt given rx for prednsione taper x 12 days.  Benadryl for itching  Final Clinical Impressions(s) / UC Diagnoses   Final diagnoses:  Rash and nonspecific skin eruption     Discharge Instructions     Return if any problems.    ED Prescriptions    Medication Sig Dispense Auth. Provider   predniSONE (DELTASONE) 10 MG tablet 6,6,5,,5,4,4,3,3,2,2,1,1 taper 42 tablet Elson Areas, [place]     PDMP not reviewed this encounter.  An After Visit Summary was printed and given to the patient.    Elson Areas, [place] 11/26/19 1021

## 2019-11-26 NOTE — Discharge Instructions (Addendum)
Return if any problems.

## 2019-11-26 NOTE — ED Triage Notes (Signed)
Pt in with c/o rash that started Sunday after she was cleaning a rental property on Saturday. States she pulled a rug up and something jumped on her and she noticed the rash the next day.   States the rash is very itchy. Pt soaked in oatmeal. Also took benadryl with no relief

## 2020-01-18 ENCOUNTER — Other Ambulatory Visit: Payer: Self-pay

## 2020-01-18 ENCOUNTER — Ambulatory Visit (HOSPITAL_COMMUNITY)
Admission: EM | Admit: 2020-01-18 | Discharge: 2020-01-18 | Disposition: A | Discharge status: Home / Self Care | Payer: 59 | Attending: Student | Admitting: Student

## 2020-01-18 ENCOUNTER — Encounter (HOSPITAL_COMMUNITY): Payer: Self-pay | Admitting: Emergency Medicine

## 2020-01-18 DIAGNOSIS — U071 COVID-19: Secondary | ICD-10-CM | POA: Diagnosis not present

## 2020-01-18 DIAGNOSIS — Z885 Allergy status to narcotic agent status: Secondary | ICD-10-CM | POA: Insufficient documentation

## 2020-01-18 DIAGNOSIS — J069 Acute upper respiratory infection, unspecified: Secondary | ICD-10-CM

## 2020-01-18 DIAGNOSIS — J45909 Unspecified asthma, uncomplicated: Secondary | ICD-10-CM | POA: Insufficient documentation

## 2020-01-18 DIAGNOSIS — Z79899 Other long term (current) drug therapy: Secondary | ICD-10-CM | POA: Diagnosis not present

## 2020-01-18 DIAGNOSIS — Z760 Encounter for issue of repeat prescription: Secondary | ICD-10-CM | POA: Diagnosis not present

## 2020-01-18 DIAGNOSIS — M419 Scoliosis, unspecified: Secondary | ICD-10-CM | POA: Insufficient documentation

## 2020-01-18 DIAGNOSIS — R059 Cough, unspecified: Secondary | ICD-10-CM | POA: Diagnosis present

## 2020-01-18 LAB — RESP PANEL BY RT-PCR (FLU A&B, COVID) ARPGX2
Influenza A by PCR: NEGATIVE
Influenza B by PCR: NEGATIVE
SARS Coronavirus 2 by RT PCR: POSITIVE — AB

## 2020-01-18 MED ORDER — ONDANSETRON HCL 8 MG PO TABS
8.0000 mg | ORAL_TABLET | Freq: Three times a day (TID) | ORAL | 0 refills | Status: DC | PRN
Start: 2020-01-18 — End: 2020-11-23

## 2020-01-18 MED ORDER — ALBUTEROL SULFATE HFA 108 (90 BASE) MCG/ACT IN AERS: 1.0000 | INHALATION_SPRAY | Freq: Four times a day (QID) | RESPIRATORY_TRACT | 0 refills | Status: DC | PRN

## 2020-01-18 MED ORDER — BENZONATATE 100 MG PO CAPS
100.0000 mg | ORAL_CAPSULE | Freq: Three times a day (TID) | ORAL | 0 refills | Status: DC
Start: 2020-01-18 — End: 2020-11-23

## 2020-01-18 MED ORDER — AMOXICILLIN-POT CLAVULANATE 875-125 MG PO TABS
1.0000 | ORAL_TABLET | Freq: Two times a day (BID) | ORAL | 0 refills | Status: DC
Start: 2020-01-18 — End: 2020-11-23

## 2020-01-18 NOTE — ED Triage Notes (Signed)
PT C/O: cold sx onset yest associated w/cough and runny nose and fever, nausea, ear pain, headache   TAKING MEDS: OTC acetaminophen   A&O x4... NAD... Ambulatory

## 2020-01-18 NOTE — ED Provider Notes (Signed)
[facility]    CSN: [id number] Arrival date & time: 01/18/20  1406      History   Chief Complaint Chief Complaint  Patient presents with  . URI         HPI Sierra Evans is a 30 y.o. female. Presenting for URI symptoms for 1 day. History of asthma and scoliosis. Endorses congestion, cough, fever, nausea, ear pain, headache. Asthma previously controlled on albuterol but pt is out of this and requesting refill today.  Denies v/d, shortness of breath, chest pain, facial pain, teeth pain,  sore throat, loss of taste/smell, swollen lymph nodes. Denies worst headache of life, thunderclap headache, weakness/sensation changes in arms/legs, vision changes, shortness of breath, chest pain/pressure, photophobia, phonophobia, n/v/d.  Denies chest pain, shortness of breath, confusion, high fevers.  Fully vaccinated for covid-19.   HPI  Past Medical History:  Diagnosis Date  . Asthma   . Scoliosis     Patient Active Problem List   Diagnosis Date Noted  . Nausea and vomiting during pregnancy prior to [redacted] weeks gestation 07/08/2014    Past Surgical History:  Procedure Laterality Date  . cervix scrape Bilateral   . CESAREAN SECTION      OB History    Gravida  4   Para  2   Term  2   Preterm      AB  0   Living  2     SAB      IAB      Ectopic  0   Multiple      Live Births  2            Home Medications    Prior to Admission medications   Medication Sig Start Date End Date Taking? Authorizing Provider  albuterol (VENTOLIN HFA) 108 (90 Base) MCG/ACT inhaler Inhale 1-2 puffs into the lungs every 6 (six) hours as needed for wheezing or shortness of breath. 01/18/20  Yes Rhys Martini, PA-C  amoxicillin-clavulanate (AUGMENTIN) 875-125 MG tablet Take 1 tablet by mouth every 12 (twelve) hours. 01/18/20  Yes Rhys Martini, PA-C  benzonatate (TESSALON) 100 MG capsule Take 1 capsule (100 mg total) by mouth every 8 (eight) hours. 01/18/20  Yes  Rhys Martini, PA-C  ondansetron (ZOFRAN) 8 MG tablet Take 1 tablet (8 mg total) by mouth every 8 (eight) hours as needed for nausea or vomiting. 01/18/20  Yes Rhys Martini, PA-C  baclofen (LIORESAL) 10 MG tablet Take 1 tablet (10 mg total) by mouth 3 (three) times daily as needed for muscle spasms. 07/31/19   Merrilee Jansky, MD  ibuprofen (ADVIL) 800 MG tablet Take by mouth. 07/27/19   [provider]  oxymetazoline (AFRIN NASAL SPRAY) 0.05 % nasal spray Place 1 spray into both nostrils as needed (nosebleeds). 11/07/19   Muthersbaugh, Dahlia Client, PA-C  predniSONE (DELTASONE) 10 MG tablet 6,6,5,,5,4,4,3,3,2,2,1,1 taper 11/26/19   Cheron Schaumann K, PA-C  tiZANidine (ZANAFLEX) 4 MG tablet Take 1 tablet (4 mg total) by mouth every 8 (eight) hours as needed. 07/18/19   Wallis Bamberg, PA-C  ferrous sulfate 325 (65 FE) MG tablet Take by mouth.  07/31/19  [provider]  loratadine (CLARITIN) 10 MG tablet Take by mouth.  07/31/19  [provider]    Family History Family History  Problem Relation Age of Onset  . Alcohol abuse Mother   . Cirrhosis Mother   . COPD Father   . Cancer Brother   . Cancer Maternal  Grandfather   . Stroke Maternal Grandfather   . Hypertension Maternal Grandfather   . Cancer Paternal Grandmother     Social History Social History   Tobacco Use  . Smoking status: Never Smoker  . Smokeless tobacco: Never Used  Substance Use Topics  . Alcohol use: No  . Drug use: No     Allergies   Codeine, Hydrocodone, and Strawberry extract   Review of Systems Review of Systems  Constitutional: Positive for appetite change. Negative for chills and fever.  HENT: Positive for congestion and ear pain. Negative for rhinorrhea, sinus pressure, sinus pain and sore throat.   Eyes: Negative for redness and visual disturbance.  Respiratory: Positive for cough. Negative for chest tightness, shortness of breath and wheezing.   Cardiovascular: Negative for chest  pain and palpitations.  Gastrointestinal: Positive for nausea. Negative for abdominal pain, constipation, diarrhea and vomiting.  Genitourinary: Negative for dysuria, frequency and urgency.  Musculoskeletal: Negative for myalgias.  Neurological: Positive for headaches. Negative for dizziness and weakness.  Psychiatric/Behavioral: Negative for confusion.  All other systems reviewed and are negative.    Physical Exam Triage Vital Signs ED Triage Vitals [01/18/20 1627]  Enc Vitals Group     BP 105/78     Pulse Rate 99     Resp 20     Temp 98.5 F (36.9 C)     Temp Source Oral     SpO2 96 %     Weight      Height      Head Circumference      Peak Flow      Pain Score 10     Pain Loc      Pain Edu?      Excl. in GC?    No data found.  Updated Vital Signs BP 105/78 (BP Location: Right Arm)   Pulse 99   Temp 98.5 F (36.9 C) (Oral)   Resp 20   LMP 12/28/2019   SpO2 96%   Visual Acuity Right Eye Distance:   Left Eye Distance:   Bilateral Distance:    Right Eye Near:   Left Eye Near:    Bilateral Near:     Physical Exam Vitals reviewed.  Constitutional:      General: She is not in acute distress.    Appearance: Normal appearance. She is ill-appearing.  HENT:     Head: Normocephalic and atraumatic.     Right Ear: Hearing, ear canal and external ear normal. No swelling or tenderness. There is no impacted cerumen. No mastoid tenderness. Tympanic membrane is erythematous and bulging. Tympanic membrane is not perforated or retracted.     Left Ear: Hearing, tympanic membrane, ear canal and external ear normal. No swelling or tenderness. There is no impacted cerumen. No mastoid tenderness. Tympanic membrane is not perforated, erythematous, retracted or bulging.     Nose:     Right Sinus: Frontal sinus tenderness present. No maxillary sinus tenderness.     Left Sinus: Frontal sinus tenderness present. No maxillary sinus tenderness.     Mouth/Throat:     Mouth: Mucous  membranes are moist.     Pharynx: Uvula midline. No oropharyngeal exudate or posterior oropharyngeal erythema.     Tonsils: No tonsillar exudate.  Cardiovascular:     Rate and Rhythm: Normal rate and regular rhythm.     Heart sounds: Normal heart sounds.  Pulmonary:     Breath sounds: Normal breath sounds and air entry. No wheezing, rhonchi or rales.  Chest:     Chest wall: No tenderness.  Abdominal:     General: Abdomen is flat. Bowel sounds are normal.     Tenderness: There is abdominal tenderness. There is no right CVA tenderness, left CVA tenderness, guarding or rebound. Negative signs include Murphy's sign, Rovsing's sign and McBurney's sign.     Comments: Generalized tenderness to palpation   Lymphadenopathy:     Cervical: No cervical adenopathy.  Neurological:     General: No focal deficit present.     Mental Status: She is alert and oriented to person, place, and time.  Psychiatric:        Attention and Perception: Attention and perception normal.        Mood and Affect: Mood and affect normal.        Behavior: Behavior normal. Behavior is cooperative.        Thought Content: Thought content normal.        Judgment: Judgment normal.      UC Treatments / Results  Labs (all labs ordered are listed, but only abnormal results are displayed) Labs Reviewed  RESP PANEL BY RT-PCR (FLU A&B, COVID) [id number]    EKG   Radiology No results found.  Procedures Procedures (including critical care time)  Medications Ordered in UC Medications - No data to display  Initial Impression / Assessment and Plan / UC Course  I have reviewed the triage vital signs and the nursing notes.  Pertinent labs & imaging results that were available during my care of the patient were reviewed by me and considered in my medical decision making (see chart for details).     Covid and influenza tests sent today. Patient is fully vaccinated for covid-19. Isolation precautions per CDC guidelines  until negative result. Symptomatic relief with OTC Mucinex, Nyquil, etc. Return precautions- new/worsening fevers/chills, shortness of breath, chest pain, abd pain, etc.   -Albuterol inhaler refilled today. Use this for coughing, shortness of breath.  --Tessalon as needed for cough. Take one pill up to 3x daily (every 8 hours) -Zofran for nausea, up to 3x daily. -Augmentin twice daily x7 days for sinusitis  -For fevers/chills, body aches, headaches- use Tylenol and Ibuprofen. You can alternate these for maximum effect. Use up to 3000mg  Tylenol daily and 3200mg  Ibuprofen daily. Make sure to take ibuprofen with food. Check the bottle of ibuprofen/tylenol for specific dosage instructions.   We are currently awaiting result of your PCR covid-19 test. Please isolate at home while awaiting these results. If your test is positive for Covid-19, continue to isolate at home for a total of 10 days from symptom onset. Treat your symptoms at home with OTC remedies like tylenol/ibuprofen, mucinex, nyquil, etc. Seek medical attention if you develop high fevers, chest pain, shortness of breath, ear pain, facial pain, etc. Make sure to get up and move around every 2-3 hours while convalescing to help prevent blood clots. Drink plenty of fluids, and rest as much as possible.  Final Clinical Impressions(s) / UC Diagnoses   Final diagnoses:  Acute upper respiratory infection     Discharge Instructions     -Albuterol inhaler refilled today. Use this for coughing, shortness of breath.  --Tessalon as needed for cough. Take one pill up to 3x daily (every 8 hours) -Zofran for nausea, up to 3x daily. -Augmentin twice daily x7 days for sinusitis  -For fevers/chills, body aches, headaches- use Tylenol and Ibuprofen. You can alternate these for maximum effect. Use up to 3000mg  Tylenol daily and  3200mg  Ibuprofen daily. Make sure to take ibuprofen with food. Check the bottle of ibuprofen/tylenol for specific dosage  instructions.   We are currently awaiting result of your PCR covid-19 test. Please isolate at home while awaiting these results. If your test is positive for Covid-19, continue to isolate at home for a total of 10 days from symptom onset. Treat your symptoms at home with OTC remedies like tylenol/ibuprofen, mucinex, nyquil, etc. Seek medical attention if you develop high fevers, chest pain, shortness of breath, ear pain, facial pain, etc. Make sure to get up and move around every 2-3 hours while convalescing to help prevent blood clots. Drink plenty of fluids, and rest as much as possible.     ED Prescriptions    Medication Sig Dispense Auth. Provider   albuterol (VENTOLIN HFA) 108 (90 Base) MCG/ACT inhaler Inhale 1-2 puffs into the lungs every 6 (six) hours as needed for wheezing or shortness of breath. 1 each , PA-C   benzonatate (TESSALON) 100 MG capsule Take 1 capsule (100 mg total) by mouth every 8 (eight) hours. 21 capsule Rhys Martini E, PA-C   ondansetron (ZOFRAN) 8 MG tablet Take 1 tablet (8 mg total) by mouth every 8 (eight) hours as needed for nausea or vomiting. 20 tablet Ignacia Bayley, PA-C   amoxicillin-clavulanate (AUGMENTIN) 875-125 MG tablet Take 1 tablet by mouth every 12 (twelve) hours. 14 tablet Rhys Martini, PA-C     PDMP not reviewed this encounter.   Rhys Martini, PA-C 01/18/20 1729

## 2020-01-18 NOTE — Discharge Instructions (Addendum)
-  Albuterol inhaler refilled today. Use this for coughing, shortness of breath.  --Tessalon as needed for cough. Take one pill up to 3x daily (every 8 hours) -Zofran for nausea, up to 3x daily. -Augmentin twice daily x7 days for sinusitis  -For fevers/chills, body aches, headaches- use Tylenol and Ibuprofen. You can alternate these for maximum effect. Use up to 3000mg  Tylenol daily and 3200mg  Ibuprofen daily. Make sure to take ibuprofen with food. Check the bottle of ibuprofen/tylenol for specific dosage instructions.   We are currently awaiting result of your PCR covid-19 test. Please isolate at home while awaiting these results. If your test is positive for Covid-19, continue to isolate at home for a total of 10 days from symptom onset. Treat your symptoms at home with OTC remedies like tylenol/ibuprofen, mucinex, nyquil, etc. Seek medical attention if you develop high fevers, chest pain, shortness of breath, ear pain, facial pain, etc. Make sure to get up and move around every 2-3 hours while convalescing to help prevent blood clots. Drink plenty of fluids, and rest as much as possible.

## 2020-01-19 ENCOUNTER — Telehealth: Payer: Self-pay | Admitting: Infectious Diseases

## 2020-01-19 NOTE — Telephone Encounter (Signed)
Called to discuss with patient about COVID-19 symptoms and the use of one of the available treatments for those with mild to moderate Covid symptoms and at a high risk of hospitalization.     Pt appears to qualify for this infusion due to co-morbid conditions and/or a member of an at-risk group in accordance with the FDA Emergency Use Authorization.    Symptom onset: 01/17/2020 Vaccinated: vaccinated - unclear if < 39m of if she has received booster Qualified for Infusion: SVI score 4, Moderate persistent asthma with regular inhaler use.    Unable to reach pt - unable to lvm. MyChart sent   She would be a great candidate for oral paxlovid therapy.    Rexene Alberts

## 2020-01-20 ENCOUNTER — Encounter: Payer: Self-pay | Admitting: Unknown Physician Specialty

## 2020-01-20 ENCOUNTER — Other Ambulatory Visit: Payer: Self-pay | Admitting: Unknown Physician Specialty

## 2020-01-20 ENCOUNTER — Other Ambulatory Visit: Payer: Self-pay | Admitting: Physician Assistant

## 2020-01-20 ENCOUNTER — Telehealth: Payer: Self-pay | Admitting: Unknown Physician Specialty

## 2020-01-20 NOTE — Telephone Encounter (Signed)
Addendum: Pt has not responded to f/u phone calls and rx not written.  Will need a Creatnine before tx  Outpatient Oral COVID Treatment Note  I connected with Sierra Evans on 01/20/2020/9:30 AM by telephone and verified that I am speaking with the correct person using two identifiers.  I discussed the limitations, risks, security, and privacy concerns of performing an evaluation and management service by telephone and the availability of in person appointments. I also discussed with the patient that there may be a patient responsible charge related to this service. The patient expressed understanding and agreed to proceed.  Patient location: home Provider location: home  Diagnosis: COVID-19 infection  Purpose of visit: Discussion of potential use of Molnupiravir or Paxlovid, a new treatment for mild to moderate COVID-19 viral infection in non-hospitalized patients.   Subjective: Patient is a 30 y.o. female who has been diagnosed with COVID 19 viral infection.  Their symptoms began on 01/17/2020  With headache, facial pain, fatigue, and muscle pain  Past Medical History:  Diagnosis Date  . Asthma   . Scoliosis     Allergies  Allergen Reactions  . Codeine Other (See Comments)    PLEASE VERIFY REACTION.    Marland Kitchen Hydrocodone Nausea And Vomiting  . Strawberry Extract Hives   Current Outpatient Medications on File Prior to Visit  Medication Sig Dispense Refill  . albuterol (VENTOLIN HFA) 108 (90 Base) MCG/ACT inhaler Inhale 1-2 puffs into the lungs every 6 (six) hours as needed for wheezing or shortness of breath. 1 each 0  . amoxicillin-clavulanate (AUGMENTIN) 875-125 MG tablet Take 1 tablet by mouth every 12 (twelve) hours. 14 tablet 0  . baclofen (LIORESAL) 10 MG tablet Take 1 tablet (10 mg total) by mouth 3 (three) times daily as needed for muscle spasms. 30 each 0  . benzonatate (TESSALON) 100 MG capsule Take 1 capsule (100 mg total) by mouth every 8 (eight) hours. 21 capsule 0  .  ibuprofen (ADVIL) 800 MG tablet Take by mouth.    . ondansetron (ZOFRAN) 8 MG tablet Take 1 tablet (8 mg total) by mouth every 8 (eight) hours as needed for nausea or vomiting. 20 tablet 0  . oxymetazoline (AFRIN NASAL SPRAY) 0.05 % nasal spray Place 1 spray into both nostrils as needed (nosebleeds). 30 mL 0  . predniSONE (DELTASONE) 10 MG tablet 6,6,5,,5,4,4,3,3,2,2,1,1 taper 42 tablet 0  . tiZANidine (ZANAFLEX) 4 MG tablet Take 1 tablet (4 mg total) by mouth every 8 (eight) hours as needed. 30 tablet 0  . [DISCONTINUED] ferrous sulfate 325 (65 FE) MG tablet Take by mouth.    . [DISCONTINUED] loratadine (CLARITIN) 10 MG tablet Take by mouth.     No current facility-administered medications on file prior to visit.    Objective: Patient seems in no apparent distress.  Breathing is non labored.  Mood and behavior are normal.  Laboratory Data:  Recent Results (from the past 2160 hour(s))  Resp Panel by RT-PCR (Flu A&B, Covid) Nasopharyngeal Swab     Status: Abnormal   Collection Time: 01/18/20  5:34 PM   Specimen: Nasopharyngeal Swab; Nasopharyngeal(NP) swabs in vial transport medium  Result Value Ref Range   SARS Coronavirus 2 by RT PCR POSITIVE (A) NEGATIVE    Comment: EMAILED MELISSA BROWNING [id number] 01/18/2020 T. TYSOR (NOTE) SARS-CoV-2 target nucleic acids are DETECTED.  The SARS-CoV-2 RNA is generally detectable in upper respiratory specimens during the acute phase of infection. Positive results are indicative of the presence of the identified virus,  but do not rule out bacterial infection or co-infection with other pathogens not detected by the test. Clinical correlation with patient history and other diagnostic information is necessary to determine patient infection status. The expected result is Negative.  Fact Sheet for Patients: EntrepreneurPulse.com.au  Fact Sheet for Healthcare Providers: IncredibleEmployment.be  This test is not yet  approved or cleared by the [place] FDA and  has been authorized for detection and/or diagnosis of SARS-CoV-2 by FDA under an Emergency Use Authorization (EUA).  This EUA will remain in effect (meaning this test can be used) for the duration of  the [place] VID-19 declaration under Section 564(b)(1) of the Act, 21 U.S.C. section 360bbb-3(b)(1), unless the authorization is terminated or revoked sooner.     Influenza A by PCR NEGATIVE NEGATIVE   Influenza B by PCR NEGATIVE NEGATIVE    Comment: (NOTE) The Xpert Xpress SARS-CoV-2/FLU/RSV plus assay is intended as an aid in the diagnosis of influenza from Nasopharyngeal swab specimens and should not be used as a sole basis for treatment. Nasal washings and aspirates are unacceptable for Xpert Xpress SARS-CoV-2/FLU/RSV testing.  Fact Sheet for Patients: EntrepreneurPulse.com.au  Fact Sheet for Healthcare Providers: IncredibleEmployment.be  This test is not yet approved or cleared by the [place] FDA and has been authorized for detection and/or diagnosis of SARS-CoV-2 by FDA under an Emergency Use Authorization (EUA). This EUA will remain in effect (meaning this test can be used) for the duration of the COVID-19 declaration under Section 564(b)(1) of the Act, 21 U.S.C. section 360bbb-3(b)(1), unless the authorization is terminated or revoked.  Performed at [facility], [place] [address] [place], [place] [address]      Assessment: 30 y.o. female with mild/moderate COVID 19 viral infection diagnosed on  at high risk for progression to severe COVID 19.  Plan:  This patient is a 30 y.o. female that meets the following criteria for Emergency Use Authorization of: Paxlovid 1. Age >12 yr AND > 40 kg 2. SARS-COV-2 positive test 3. Symptom onset < 5 days 4. Mild-to-moderate COVID disease with high risk for severe progression to hospitalization or death  I have spoken and communicated the  following to the patient or parent/caregiver regarding: 1. Paxlovid is an unapproved drug that is authorized for use under an Emergency Use Authorization.  2. There are no adequate, approved, available products for the treatment of COVID-19 in adults who have mild-to-moderate COVID-19 and are at high risk for progressing to severe COVID-19, including hospitalization or death. 3. Other therapeutics are currently authorized. For additional information on all products authorized for treatment or prevention of COVID-19, please see TanEmporium.pl.  4. There are benefits and risks of taking this treatment as outlined in the "Fact Sheet for Patients and Caregivers."  5. "Fact Sheet for Patients and Caregivers" was reviewed with patient. A hard copy will be provided to patient from pharmacy prior to the patient receiving treatment. 6. Patients should continue to self-isolate and use infection control measures (e.g., wear mask, isolate, social distance, avoid sharing personal items, clean and disinfect "high touch" surfaces, and frequent handwashing) according to CDC guidelines.  7. The patient or parent/caregiver has the option to accept or refuse treatment. 8. Patient medication history was reviewed for potential drug interactions:No drug interactions Steroid taper ordered in November, no longer taking   After reviewing above information with the patient, the patient agrees to receive Paxlovid.  Follow up instructions:    . Take prescription BID x 5 days as directed .  Reach out to pharmacist for counseling on medication if desired . For concerns regarding further COVID symptoms please follow up with your PCP or urgent care . For urgent or life-threatening issues, seek care at your local emergency department  The patient was provided an opportunity to ask questions, and all were answered. The  patient agreed with the plan and demonstrated an understanding of the instructions.   Script sent to [facility] and opted to pick up RX.  The patient was advised to call their PCP or seek an in-person evaluation if the symptoms worsen or if the condition fails to improve as anticipated.   I provided 15 minutes of non face-to-face telephone visit time during this encounter, and > 50% was spent counseling as documented under my assessment & plan.  Gabriel Cirri, NP 01/20/2020 /9:30 AM   Addendum: Will need to have a creatnine - Id'd a solution but unable to get patient on the phone.  No rx written and will need a creatnine before receiving

## 2020-03-18 ENCOUNTER — Emergency Department (HOSPITAL_COMMUNITY): Payer: 59

## 2020-03-18 ENCOUNTER — Emergency Department (HOSPITAL_COMMUNITY)
Admission: EM | Admit: 2020-03-18 | Discharge: 2020-03-19 | Disposition: A | Discharge status: Home / Self Care | Payer: 59 | Attending: Emergency Medicine | Admitting: Emergency Medicine

## 2020-03-18 ENCOUNTER — Encounter (HOSPITAL_COMMUNITY): Payer: Self-pay | Admitting: Emergency Medicine

## 2020-03-18 ENCOUNTER — Other Ambulatory Visit: Payer: Self-pay

## 2020-03-18 DIAGNOSIS — Y9389 Activity, other specified: Secondary | ICD-10-CM | POA: Insufficient documentation

## 2020-03-18 DIAGNOSIS — M25579 Pain in unspecified ankle and joints of unspecified foot: Secondary | ICD-10-CM

## 2020-03-18 DIAGNOSIS — J45909 Unspecified asthma, uncomplicated: Secondary | ICD-10-CM | POA: Diagnosis not present

## 2020-03-18 DIAGNOSIS — S82832A Other fracture of upper and lower end of left fibula, initial encounter for closed fracture: Secondary | ICD-10-CM | POA: Diagnosis not present

## 2020-03-18 DIAGNOSIS — W500XXA Accidental hit or strike by another person, initial encounter: Secondary | ICD-10-CM | POA: Insufficient documentation

## 2020-03-18 DIAGNOSIS — S99922A Unspecified injury of left foot, initial encounter: Secondary | ICD-10-CM | POA: Diagnosis present

## 2020-03-18 NOTE — ED Notes (Addendum)
Pt's brother Nelma Rothman) would like to be called when pt is back in room [phone]

## 2020-03-18 NOTE — ED Triage Notes (Signed)
Patient's boyfriend accidentally fell on her left foot while playing this evening with pain and swelling .

## 2020-03-19 MED ORDER — OXYCODONE-ACETAMINOPHEN 5-325 MG PO TABS
1.0000 | ORAL_TABLET | Freq: Once | ORAL | Status: AC
  Administered 2020-03-19: 1 via ORAL
  Filled 2020-03-19: qty 1

## 2020-03-19 NOTE — Progress Notes (Signed)
Orthopedic Tech Progress Note Patient Details:  Sierra Evans [birth year] [id number]  Ortho Devices Type of Ortho Device: CAM walker,Crutches Ortho Device/Splint Location: Left Foot Ortho Device/Splint Interventions: Application,Adjustment   Post Interventions Patient Tolerated: Well Instructions Provided: Adjustment of device,Poper ambulation with device   Austin E Bullins 03/19/2020, 6:20 AM

## 2020-03-19 NOTE — ED Provider Notes (Signed)
[facility] EMERGENCY DEPARTMENT Provider Note   CSN: [id number] Arrival date & time: 03/18/20  2231     History Chief Complaint  Patient presents with  . Foot Injury    Sierra Evans is a 30 y.o. female presents to the ED for evaluation of acute onset moderate to severe left-sided ankle and distal leg pain tremulous prior to her arrival to the ED.  States she was playing around and her boyfriend excellently fell on the outside of her ankle.  Has associated pain and swelling.  Foot feels numb.  She feels like she cannot move the toes secondary to pain.  No interventions.  Patient has a cam walker on her foot that was ordered in triage.  Denies any other physical injuries. No previous injuries or surgeries to foot or ankle.   HPI     Past Medical History:  Diagnosis Date  . Asthma   . Scoliosis     Patient Active Problem List   Diagnosis Date Noted  . Nausea and vomiting during pregnancy prior to [redacted] weeks gestation 07/08/2014    Past Surgical History:  Procedure Laterality Date  . cervix scrape Bilateral   . CESAREAN SECTION       OB History    Gravida  4   Para  2   Term  2   Preterm      AB  0   Living  2     SAB      IAB      Ectopic  0   Multiple      Live Births  2           Family History  Problem Relation Age of Onset  . Alcohol abuse Mother   . Cirrhosis Mother   . COPD Father   . Cancer Brother   . Cancer Maternal Grandfather   . Stroke Maternal Grandfather   . Hypertension Maternal Grandfather   . Cancer Paternal Grandmother     Social History   Tobacco Use  . Smoking status: Never Smoker  . Smokeless tobacco: Never Used  Substance Use Topics  . Alcohol use: No  . Drug use: No    Home Medications Prior to Admission medications   Medication Sig Start Date End Date Taking? Authorizing Provider  albuterol (VENTOLIN HFA) 108 (90 Base) MCG/ACT inhaler Inhale 1-2 puffs into the lungs every 6 (six)  hours as needed for wheezing or shortness of breath. 01/18/20   Rhys Martini, PA-C  amoxicillin-clavulanate (AUGMENTIN) 875-125 MG tablet Take 1 tablet by mouth every 12 (twelve) hours. 01/18/20   Rhys Martini, PA-C  baclofen (LIORESAL) 10 MG tablet Take 1 tablet (10 mg total) by mouth 3 (three) times daily as needed for muscle spasms. 07/31/19   Lamptey, Britta Mccreedy, MD  benzonatate (TESSALON) 100 MG capsule Take 1 capsule (100 mg total) by mouth every 8 (eight) hours. 01/18/20   Rhys Martini, PA-C  ibuprofen (ADVIL) 800 MG tablet Take by mouth. 07/27/19   [provider]  ondansetron (ZOFRAN) 8 MG tablet Take 1 tablet (8 mg total) by mouth every 8 (eight) hours as needed for nausea or vomiting. 01/18/20   Rhys Martini, PA-C  oxymetazoline (AFRIN NASAL SPRAY) 0.05 % nasal spray Place 1 spray into both nostrils as needed (nosebleeds). 11/07/19   Muthersbaugh, Dahlia Client, PA-C  predniSONE (DELTASONE) 10 MG tablet 6,6,5,,5,4,4,3,3,2,2,1,1 taper 11/26/19   Cheron Schaumann K, PA-C  tiZANidine (ZANAFLEX) 4 MG tablet  Take 1 tablet (4 mg total) by mouth every 8 (eight) hours as needed. 07/18/19   Wallis Bamberg, PA-C  ferrous sulfate 325 (65 FE) MG tablet Take by mouth.  07/31/19  [provider]  loratadine (CLARITIN) 10 MG tablet Take by mouth.  07/31/19  [provider]    Allergies    Codeine, Hydrocodone, and Strawberry extract  Review of Systems   Review of Systems  Musculoskeletal: Positive for arthralgias, gait problem and joint swelling.  Neurological: Positive for numbness.  All other systems reviewed and are negative.   Physical Exam Updated Vital Signs BP 128/69 (BP Location: Right Arm)   Pulse 67   Temp 98.9 F (37.2 C) (Oral)   Resp 20   Ht 5\' 2"  (1.575 m)   Wt 107 kg   LMP 03/03/2020   SpO2 100%   BMI 43.15 kg/m   Physical Exam Constitutional:      Appearance: She is well-developed.  HENT:     Head: Normocephalic.     Nose: Nose normal.  Eyes:      General: Lids are normal.  Cardiovascular:     Rate and Rhythm: Normal rate.     Pulses:          Dorsalis pedis pulses are 1+ on the left side.       Posterior tibial pulses are 1+ on the left side.  Pulmonary:     Effort: Pulmonary effort is normal. No respiratory distress.  Musculoskeletal:        General: Normal range of motion.     Cervical back: Normal range of motion.     Left lower leg: Tenderness present.     Comments: Focal TTP distal fibula above lateral malleolus. Localized edema. Skin normal and intact. Diffuse pain lateral ankle, foot and distal leg.  Negative squeeze test. Patient refusing to attempt plantar dorsi flexion due to pain. Can wiggle her toes. Sensation medial/lateral foot to rub intact. Skin immediately over distal fibula has decreased sensation  Neurological:     Mental Status: She is alert.  Psychiatric:        Behavior: Behavior normal.     ED Results / Procedures / Treatments   Labs (all labs ordered are listed, but only abnormal results are displayed) Labs Reviewed - No data to display  EKG None  Radiology DG Ankle Complete Left  Result Date: 03/18/2020 CLINICAL DATA:  Pain and swelling EXAM: LEFT ANKLE COMPLETE - 3+ VIEW COMPARISON:  07/18/2019 FINDINGS: Acute nondisplaced fracture involving distal shaft and metaphysis of the fibula. Ankle mortise is symmetric. Positive for soft tissue swelling. IMPRESSION: Acute nondisplaced fracture involving the distal fibula. Electronically Signed   By: 09/18/2019 M.D.   On: 03/18/2020 23:17   DG Foot Complete Left  Result Date: 03/18/2020 CLINICAL DATA:  Injury with swelling EXAM: LEFT FOOT - COMPLETE 3+ VIEW COMPARISON:  None. FINDINGS: No fracture or malalignment at the foot. Acute distal fibular fracture. IMPRESSION: Acute distal fibular fracture. Electronically Signed   By: 05/18/2020 M.D.   On: 03/18/2020 23:15    Procedures Procedures   Medications Ordered in ED Medications   oxyCODONE-acetaminophen (PERCOCET/ROXICET) 5-325 MG per tablet 1 tablet (1 tablet Oral Given 03/19/20 0017)    ED Course  I have reviewed the triage vital signs and the nursing notes.  Pertinent labs & imaging results that were available during my care of the patient were reviewed by me and considered in my medical decision making (see chart  for details).    MDM Rules/Calculators/A&P                          X-ray and cam walker ordered in triage.  By the time I evaluated patient she had cam walker which provider stabilization of the foot and pain relief.  Negative squeeze test.  No other signs of injury to the proximal distal joints.  Reports diffuse "numbness" and exam is inconsistent.  She does feel light touch on the bottom of the foot medially and laterally, and consistently reports no sensation on the top of the foot.  Able to wiggle her toes.  Pulses intact.  Skin intact.  No will discharge with orthopedic follow-up, crutches, Cam walker per previous provider.  Offered patient stirrup splint but patient comfortable with cam walker and crutches. Return precautions discussed.  High-dose NSAIDs, elevation, ice  Final Clinical Impression(s) / ED Diagnoses Final diagnoses:  Other closed fracture of distal end of left fibula, initial encounter    Rx / DC Orders ED Discharge Orders    None       Liberty Handy, PA-C 03/19/20 [id number]    Palumbo, April, MD 03/19/20 [phone]

## 2020-03-19 NOTE — Discharge Instructions (Addendum)
You were seen in the emergency department for left leg pain  X-ray showed that you have a distal fibular fracture  The treatment for this includes either a stirrup splint or a cam walker.  Please use your crutches and avoid any weightbearing until you see the orthopedic doctor  Elevate your foot as much as you can.  You may remove the cam walker boot for showering or icing. You may take 600 mg ibuprofen every 6 hours. For more pain control you can add [phone] mg acetaminophen every 6 hours.   Return for severe constant pain, discoloration to your foot or toes, significant swelling  Call Dr Aundria Rud and make an appointment for follow up

## 2020-09-17 ENCOUNTER — Other Ambulatory Visit: Payer: Self-pay | Admitting: Family Medicine

## 2020-09-17 ENCOUNTER — Ambulatory Visit (HOSPITAL_COMMUNITY)
Admission: EM | Admit: 2020-09-17 | Discharge: 2020-09-17 | Disposition: A | Discharge status: Home / Self Care | Payer: Medicaid Other | Attending: Family Medicine | Admitting: Family Medicine

## 2020-09-17 ENCOUNTER — Encounter (HOSPITAL_COMMUNITY): Payer: Self-pay

## 2020-09-17 DIAGNOSIS — S025XXA Fracture of tooth (traumatic), initial encounter for closed fracture: Secondary | ICD-10-CM

## 2020-09-17 DIAGNOSIS — K0889 Other specified disorders of teeth and supporting structures: Secondary | ICD-10-CM

## 2020-09-17 MED ORDER — AMOXICILLIN-POT CLAVULANATE 875-125 MG PO TABS: 1.0000 | ORAL_TABLET | Freq: Two times a day (BID) | ORAL | 0 refills | Status: DC

## 2020-09-17 MED ORDER — CHLORHEXIDINE GLUCONATE 0.12 % MT SOLN: 15.0000 mL | Freq: Two times a day (BID) | OROMUCOSAL | 0 refills | Status: DC

## 2020-09-17 MED ORDER — LIDOCAINE VISCOUS HCL 2 % MT SOLN: 10.0000 mL | OROMUCOSAL | 0 refills | Status: DC | PRN

## 2020-09-17 NOTE — ED Triage Notes (Signed)
Pt reports dental pian x 1 day. Reports 1 of her teeth broke yesterday. Pt was not able to go to the Dentist.

## 2020-09-17 NOTE — ED Provider Notes (Signed)
[facility]    CSN: [id number] Arrival date & time: 09/17/20  2951      History   Chief Complaint Chief Complaint  Patient presents with   Dental Pain    HPI Sierra Evans is a 30 y.o. female.   Patient presenting today with 1 day history of dental pain after breaking a tooth in the left upper jaw.  States she has had swelling in the area, significant pain and difficulty chewing due to the pain.  Her pain is 10 out of 10 currently.  Taking Goody powder, Tylenol with minimal relief.  Has a dental appointment scheduled for Tuesday but because of the holiday could not be seen sooner.   Past Medical History:  Diagnosis Date   Asthma    Scoliosis     Patient Active Problem List   Diagnosis Date Noted   Nausea and vomiting during pregnancy prior to [redacted] weeks gestation 07/08/2014    Past Surgical History:  Procedure Laterality Date   cervix scrape Bilateral    CESAREAN SECTION      OB History     Gravida  4   Para  2   Term  2   Preterm      AB  0   Living  2      SAB      IAB      Ectopic  0   Multiple      Live Births  2            Home Medications    Prior to Admission medications   Medication Sig Start Date End Date Taking? Authorizing Provider  amoxicillin-clavulanate (AUGMENTIN) 875-125 MG tablet Take 1 tablet by mouth every 12 (twelve) hours. 09/17/20  Yes Particia Nearing, PA-C  chlorhexidine (PERIDEX) 0.12 % solution Use as directed 15 mLs in the mouth or throat 2 (two) times daily. 09/17/20  Yes Particia Nearing, PA-C  lidocaine (XYLOCAINE) 2 % solution Use as directed 10 mLs in the mouth or throat as needed for mouth pain. 09/17/20  Yes Particia Nearing, PA-C  albuterol (VENTOLIN HFA) 108 (90 Base) MCG/ACT inhaler Inhale 1-2 puffs into the lungs every 6 (six) hours as needed for wheezing or shortness of breath. 01/18/20   Rhys Martini, PA-C  amoxicillin-clavulanate (AUGMENTIN) 875-125 MG tablet Take 1  tablet by mouth every 12 (twelve) hours. 01/18/20   Rhys Martini, PA-C  baclofen (LIORESAL) 10 MG tablet Take 1 tablet (10 mg total) by mouth 3 (three) times daily as needed for muscle spasms. 07/31/19   Lamptey, Britta Mccreedy, MD  benzonatate (TESSALON) 100 MG capsule Take 1 capsule (100 mg total) by mouth every 8 (eight) hours. 01/18/20   Rhys Martini, PA-C  ibuprofen (ADVIL) 800 MG tablet Take by mouth. 07/27/19   [provider]  ondansetron (ZOFRAN) 8 MG tablet Take 1 tablet (8 mg total) by mouth every 8 (eight) hours as needed for nausea or vomiting. 01/18/20   Rhys Martini, PA-C  oxymetazoline (AFRIN NASAL SPRAY) 0.05 % nasal spray Place 1 spray into both nostrils as needed (nosebleeds). 11/07/19   Muthersbaugh, Dahlia Client, PA-C  predniSONE (DELTASONE) 10 MG tablet 6,6,5,,5,4,4,3,3,2,2,1,1 taper 11/26/19   Cheron Schaumann K, PA-C  tiZANidine (ZANAFLEX) 4 MG tablet Take 1 tablet (4 mg total) by mouth every 8 (eight) hours as needed. 07/18/19   Wallis Bamberg, PA-C  ferrous sulfate 325 (65 FE) MG tablet Take by mouth.  07/31/19  [provider]  loratadine (CLARITIN) 10 MG tablet Take by mouth.  07/31/19  [provider]    Family History Family History  Problem Relation Age of Onset   Alcohol abuse Mother    Cirrhosis Mother    COPD Father    Cancer Brother    Cancer Maternal Grandfather    Stroke Maternal Grandfather    Hypertension Maternal Grandfather    Cancer Paternal Grandmother     Social History Social History   Tobacco Use   Smoking status: Never   Smokeless tobacco: Never  Substance Use Topics   Alcohol use: No   Drug use: No     Allergies   Codeine, Hydrocodone, and Strawberry extract   Review of Systems Review of Systems Per HPI  Physical Exam Triage Vital Signs ED Triage Vitals  Enc Vitals Group     BP 09/17/20 0925 119/75     Pulse Rate 09/17/20 0925 74     Resp 09/17/20 0925 18     Temp 09/17/20 0925 98.2 F (36.8 C)     Temp Source  09/17/20 0925 Oral     SpO2 09/17/20 0925 96 %     Weight --      Height --      Head Circumference --      Peak Flow --      Pain Score 09/17/20 0924 10     Pain Loc --      Pain Edu? --      Excl. in GC? --    No data found.  Updated Vital Signs BP 119/75 (BP Location: Left Arm)   Pulse 74   Temp 98.2 F (36.8 C) (Oral)   Resp 18   LMP  (Within Weeks) Comment: 3 weeks  SpO2 96%   Visual Acuity Right Eye Distance:   Left Eye Distance:   Bilateral Distance:    Right Eye Near:   Left Eye Near:    Bilateral Near:     Physical Exam Vitals and nursing note reviewed.  Constitutional:      Appearance: Normal appearance. She is not ill-appearing.  HENT:     Head: Atraumatic.     Mouth/Throat:     Mouth: Mucous membranes are moist.     Pharynx: Posterior oropharyngeal erythema present.     Comments: Broken tooth present left upper anterior molar.  Surrounding gingival erythema Eyes:     Extraocular Movements: Extraocular movements intact.     Conjunctiva/sclera: Conjunctivae normal.  Cardiovascular:     Rate and Rhythm: Normal rate and regular rhythm.     Heart sounds: Normal heart sounds.  Pulmonary:     Effort: Pulmonary effort is normal.     Breath sounds: Normal breath sounds.  Musculoskeletal:        General: Normal range of motion.     Cervical back: Normal range of motion and neck supple.  Skin:    General: Skin is warm and dry.     Findings: No rash.  Neurological:     Mental Status: She is alert and oriented to person, place, and time.  Psychiatric:        Mood and Affect: Mood normal.        Thought Content: Thought content normal.        Judgment: Judgment normal.    UC Treatments / Results  Labs (all labs ordered are listed, but only abnormal results are displayed) Labs Reviewed - No data to display  EKG  Radiology No results found.  Procedures Procedures (including critical care time)  Medications Ordered in UC Medications - No data  to display  Initial Impression / Assessment and Plan / UC Course  I have reviewed the triage vital signs and the nursing notes.  Pertinent labs & imaging results that were available during my care of the patient were reviewed by me and considered in my medical decision making (see chart for details).     No evidence of current infection, but in order to protect the area from infection while awaiting the dental follow-up will start Augmentin, Peridex and viscous lidocaine for comfort.  Discussed over-the-counter pain relievers additionally.  Follow-up with dentist as scheduled.  Final Clinical Impressions(s) / UC Diagnoses   Final diagnoses:  Pain, dental  Closed fracture of tooth, initial encounter   Discharge Instructions   None    ED Prescriptions     Medication Sig Dispense Auth. Provider   amoxicillin-clavulanate (AUGMENTIN) 875-125 MG tablet Take 1 tablet by mouth every 12 (twelve) hours. 14 tablet Particia Nearing, [place]   lidocaine (XYLOCAINE) 2 % solution Use as directed 10 mLs in the mouth or throat as needed for mouth pain. 100 mL Particia Nearing, PA-C   chlorhexidine (PERIDEX) 0.12 % solution Use as directed 15 mLs in the mouth or throat 2 (two) times daily. 120 mL Particia Nearing, [place]      PDMP not reviewed this encounter.   Roosvelt Maser [place], [place] 09/17/20 [phone]

## 2020-11-23 ENCOUNTER — Other Ambulatory Visit: Payer: Self-pay

## 2020-11-23 ENCOUNTER — Encounter: Payer: Self-pay | Admitting: Emergency Medicine

## 2020-11-23 ENCOUNTER — Ambulatory Visit
Admission: EM | Admit: 2020-11-23 | Discharge: 2020-11-23 | Disposition: A | Discharge status: Home / Self Care | Payer: Medicaid Other | Attending: Emergency Medicine | Admitting: Emergency Medicine

## 2020-11-23 DIAGNOSIS — Z3A00 Weeks of gestation of pregnancy not specified: Secondary | ICD-10-CM

## 2020-11-23 DIAGNOSIS — Z3490 Encounter for supervision of normal pregnancy, unspecified, unspecified trimester: Secondary | ICD-10-CM

## 2020-11-23 DIAGNOSIS — O9229 Other disorders of breast associated with pregnancy and the puerperium: Secondary | ICD-10-CM

## 2020-11-23 DIAGNOSIS — N644 Mastodynia: Secondary | ICD-10-CM

## 2020-11-23 DIAGNOSIS — R110 Nausea: Secondary | ICD-10-CM | POA: Diagnosis not present

## 2020-11-23 DIAGNOSIS — Z3201 Encounter for pregnancy test, result positive: Secondary | ICD-10-CM

## 2020-11-23 LAB — POCT URINE PREGNANCY: Preg Test, Ur: POSITIVE — AB

## 2020-11-23 NOTE — ED Triage Notes (Signed)
Pt states she has not had a period since 10/23/20. She is having nausea and breast tenderness. She has not taken a UPT but request one today.

## 2020-11-23 NOTE — Discharge Instructions (Addendum)
You are pregnant.  You not currently taking any prenatal vitamins, please begin now, these can be purchased off-the-shelf at any pharmacy in the vitamin section, all prenatal vitamins are required to have the same appropriate amounts of folic acid so feel free to choose any brand.  It is okay for you to continue using your albuterol as needed however you should discontinue ibuprofen at this time, if you have any further questions about over-the-counter medications that you can take when you are pregnant please check with the pharmacist.  Please be sure you schedule an appointment with an obstetrician in the next 6 weeks for your initial prenatal visit.

## 2020-11-23 NOTE — ED Provider Notes (Signed)
[facility]    CSN: [id number] Arrival date & time: 11/23/20  0936      History   Chief Complaint Chief Complaint  Patient presents with   Amenorrhea    HPI PATTYE MEDA is a 30 y.o. female.   Pt states she has not had a period since 10/23/20, states that periods was normal, states she is regular. She is having nausea and significant breast tenderness. She has not taken a UPT but requests one today.  UPT is positive today.  The history is provided by the patient.   Past Medical History:  Diagnosis Date   Asthma    Scoliosis     Patient Active Problem List   Diagnosis Date Noted   Nausea and vomiting during pregnancy prior to [redacted] weeks gestation 07/08/2014    Past Surgical History:  Procedure Laterality Date   cervix scrape Bilateral    CESAREAN SECTION      OB History     Gravida  4   Para  2   Term  2   Preterm      AB  0   Living  2      SAB      IAB      Ectopic  0   Multiple      Live Births  2            Home Medications    Prior to Admission medications   Medication Sig Start Date End Date Taking? Authorizing Provider  albuterol (VENTOLIN HFA) 108 (90 Base) MCG/ACT inhaler Inhale 1-2 puffs into the lungs every 6 (six) hours as needed for wheezing or shortness of breath. 01/18/20   Rhys Martini, PA-C  ibuprofen (ADVIL) 800 MG tablet Take by mouth. 07/27/19   [provider]  ferrous sulfate 325 (65 FE) MG tablet Take by mouth.  07/31/19  [provider]  loratadine (CLARITIN) 10 MG tablet Take by mouth.  07/31/19  [provider]    Family History Family History  Problem Relation Age of Onset   Alcohol abuse Mother    Cirrhosis Mother    COPD Father    Cancer Brother    Cancer Maternal Grandfather    Stroke Maternal Grandfather    Hypertension Maternal Grandfather    Cancer Paternal Grandmother     Social History Social History   Tobacco Use   Smoking status: Never    Smokeless tobacco: Never  Substance Use Topics   Alcohol use: No   Drug use: No     Allergies   Codeine, Hydrocodone, and Strawberry extract   Review of Systems Review of Systems Pertinent findings noted in history of present illness.    Physical Exam Triage Vital Signs ED Triage Vitals  Enc Vitals Group     BP 11/12/20 0827 (!) 147/82     Pulse Rate 11/12/20 0827 72     Resp 11/12/20 0827 18     Temp 11/12/20 0827 98.3 F (36.8 C)     Temp Source 11/12/20 0827 Oral     SpO2 11/12/20 0827 98 %     Weight --      Height --      Head Circumference --      Peak Flow --      Pain Score 11/12/20 0826 5     Pain Loc --      Pain Edu? --      Excl. in GC? --  No data found.  Updated Vital Signs BP 107/62 (BP Location: Right Arm)   Pulse 82   Temp 98.7 F (37.1 C) (Oral)   Resp 16   SpO2 99%   Visual Acuity Right Eye Distance:   Left Eye Distance:   Bilateral Distance:    Right Eye Near:   Left Eye Near:    Bilateral Near:     Physical Exam Vitals and nursing note reviewed.  Constitutional:      General: She is not in acute distress.    Appearance: Normal appearance. She is not ill-appearing.  HENT:     Head: Normocephalic and atraumatic.  Eyes:     General: Lids are normal.        Right eye: No discharge.        Left eye: No discharge.     Extraocular Movements: Extraocular movements intact.     Conjunctiva/sclera: Conjunctivae normal.     Right eye: Right conjunctiva is not injected.     Left eye: Left conjunctiva is not injected.  Neck:     Trachea: Trachea and phonation normal.  Cardiovascular:     Rate and Rhythm: Normal rate and regular rhythm.     Pulses: Normal pulses.     Heart sounds: Normal heart sounds. No murmur heard.   No friction rub. No gallop.  Pulmonary:     Effort: Pulmonary effort is normal. No accessory muscle usage, prolonged expiration or respiratory distress.     Breath sounds: Normal breath sounds. No stridor,  decreased air movement or transmitted upper airway sounds. No decreased breath sounds, wheezing, rhonchi or rales.  Chest:     Chest wall: No tenderness.  Musculoskeletal:        General: Normal range of motion.     Cervical back: Normal range of motion and neck supple. Normal range of motion.  Lymphadenopathy:     Cervical: No cervical adenopathy.  Skin:    General: Skin is warm and dry.     Findings: No erythema or rash.  Neurological:     General: No focal deficit present.     Mental Status: She is alert and oriented to person, place, and time.  Psychiatric:        Mood and Affect: Mood normal.        Behavior: Behavior normal.     UC Treatments / Results  Labs (all labs ordered are listed, but only abnormal results are displayed) Labs Reviewed  POCT URINE PREGNANCY - Abnormal; Notable for the following components:      Result Value   Preg Test, Ur Positive (*)    All other components within normal limits    EKG   Radiology No results found.  Procedures Procedures (including critical care time)  Medications Ordered in UC Medications - No data to display  Initial Impression / Assessment and Plan / UC Course  I have reviewed the triage vital signs and the nursing notes.  Pertinent labs & imaging results that were available during my care of the patient were reviewed by me and considered in my medical decision making (see chart for details).     Pregnant.  Patient advised she can continue albuterol that she discontinue ibuprofen this time.  Patient also advised to begin prenatal vitamins now which can be purchased over-the-counter.  Patient advised to reach out to her GYN to schedule her first appointment.  Patient verbalized understanding and agreement of plan as discussed.  All questions were addressed during  visit.  Please see discharge instructions below for further details of plan.  Final Clinical Impressions(s) / UC Diagnoses   Final diagnoses:  Breast  tenderness in female  Nausea  Pregnancy, unspecified gestational age     Discharge Instructions      You are pregnant.  You not currently taking any prenatal vitamins, please begin now, these can be purchased off-the-shelf at any pharmacy in the vitamin section, all prenatal vitamins are required to have the same appropriate amounts of folic acid so feel free to choose any brand.  It is okay for you to continue using your albuterol as needed however you should discontinue ibuprofen at this time, if you have any further questions about over-the-counter medications that you can take when you are pregnant please check with the pharmacist.  Please be sure you schedule an appointment with an obstetrician in the next 6 weeks for your initial prenatal visit.     ED Prescriptions   None    PDMP not reviewed this encounter.   Disposition Upon Discharge:   The patient will follow up with OB/GYN.    Condition: stable for discharge home Home: take medications as prescribed; routine discharge instructions as discussed; follow up as advised.    Theadora Rama Scales, PA-C 11/23/20 1106

## 2020-11-29 ENCOUNTER — Other Ambulatory Visit: Payer: Self-pay

## 2020-11-29 ENCOUNTER — Inpatient Hospital Stay (HOSPITAL_COMMUNITY)
Admission: AD | Admit: 2020-11-29 | Discharge: 2020-11-29 | Disposition: A | Discharge status: Home / Self Care | Payer: Medicaid Other | Attending: Family Medicine | Admitting: Family Medicine

## 2020-11-29 ENCOUNTER — Encounter (HOSPITAL_COMMUNITY): Payer: Self-pay | Admitting: Family Medicine

## 2020-11-29 DIAGNOSIS — O219 Vomiting of pregnancy, unspecified: Secondary | ICD-10-CM | POA: Insufficient documentation

## 2020-11-29 DIAGNOSIS — Z3A01 Less than 8 weeks gestation of pregnancy: Secondary | ICD-10-CM | POA: Diagnosis not present

## 2020-11-29 DIAGNOSIS — O210 Mild hyperemesis gravidarum: Secondary | ICD-10-CM

## 2020-11-29 DIAGNOSIS — O99611 Diseases of the digestive system complicating pregnancy, first trimester: Secondary | ICD-10-CM

## 2020-11-29 DIAGNOSIS — K5900 Constipation, unspecified: Secondary | ICD-10-CM | POA: Diagnosis not present

## 2020-11-29 LAB — URINALYSIS, MICROSCOPIC (REFLEX)

## 2020-11-29 LAB — URINALYSIS, ROUTINE W REFLEX MICROSCOPIC
Bilirubin Urine: NEGATIVE
Glucose, UA: NEGATIVE mg/dL
Hgb urine dipstick: NEGATIVE
Ketones, ur: NEGATIVE mg/dL
Nitrite: NEGATIVE
Protein, ur: 30 mg/dL — AB
Specific Gravity, Urine: 1.02 (ref 1.005–1.030)
pH: 8 (ref 5.0–8.0)

## 2020-11-29 MED ORDER — POLYETHYLENE GLYCOL 3350 17 G PO PACK: 17.0000 g | PACK | Freq: Every day | ORAL | 0 refills | Status: DC

## 2020-11-29 MED ORDER — PROMETHAZINE HCL 25 MG PO TABS: 25.0000 mg | ORAL_TABLET | Freq: Four times a day (QID) | ORAL | 2 refills | Status: DC | PRN

## 2020-11-29 MED ORDER — BISACODYL 10 MG RE SUPP: 10.0000 mg | RECTAL | 0 refills | Status: DC | PRN

## 2020-11-29 NOTE — MAU Provider Note (Signed)
Event Date/Time   First Provider Initiated Contact with Patient 11/29/20 1618     [name] is a 30 y.o. [id number] at [redacted]w[redacted]d by LMP patient who presents to MAU today with complaint of occasional vomiting and constipation. Constipation not alleviated by fluids, prune juice. Has not taken any medication. Also wants medication for nausea.  Denies pelvic/lower abdominal pain, vaginal bleeding. Denies any abnormal vaginal discharge, fevers, chills, sweats, dysuria or other general symptoms.  O BP 115/64 (BP Location: Right Arm)   Pulse 68   Temp 98.4 F (36.9 C) (Oral)   Resp 18   Ht 5\' 2"  (1.575 m)   Wt 90.9 kg   LMP 10/23/2020   SpO2 100%   BMI 36.64 kg/m  Physical Exam Gen - NAD Psych - Normal mood and affect Lungs - Normal breath sounds Heart - RRR Abd - NT, ND, soft Ext - No c/c/e  Results for orders placed or performed during the hospital encounter of 11/29/20 (from the past 24 hour(s))  Urinalysis, Routine w reflex microscopic Urine, Clean Catch     Status: Abnormal   Collection Time: 11/29/20  2:49 PM  Result Value Ref Range   Color, Urine YELLOW YELLOW   APPearance TURBID (A) CLEAR   Specific Gravity, Urine 1.020 1.005 - 1.030   pH 8.0 5.0 - 8.0   Glucose, UA NEGATIVE NEGATIVE mg/dL   Hgb urine dipstick NEGATIVE NEGATIVE   Bilirubin Urine NEGATIVE NEGATIVE   Ketones, ur NEGATIVE NEGATIVE mg/dL   Protein, ur 30 (A) NEGATIVE mg/dL   Nitrite NEGATIVE NEGATIVE   Leukocytes,Ua SMALL (A) NEGATIVE  Urinalysis, Microscopic (reflex)     Status: Abnormal   Collection Time: 11/29/20  2:49 PM  Result Value Ref Range   RBC / HPF 0-5 0 - 5 RBC/hpf   WBC, UA 6-10 0 - 5 WBC/hpf   Bacteria, UA RARE (A) NONE SEEN   Squamous Epithelial / LPF 21-50 0 - 5   Amorphous Crystal PRESENT     A Medical screening exam complete 1. Nausea and vomiting during pregnancy prior to [redacted] weeks gestation  2. Constipation during pregnancy in first trimester  3. [redacted] weeks gestation of  pregnancy   P Given Phenergan prescription and advised to take Miralax, then Dulcolax suppositories. If that does not work for constipation, may consider enema. Warning signs for worsening condition that would warrant emergency follow-up discussed She was given a note for work, also sent a message to [facility] to set up [facility] appt    Miniya Miguez, 12/01/20, MD 11/29/2020 4:37 PM

## 2020-11-29 NOTE — MAU Note (Addendum)
Found out last wk that she was pregnant.  This is the third week that she is having problems going to the bathroom.  Has been eating salads, drinking prune juice. Has been throwing up, hardly peeing now. Has been staying in the house.  Feeling worse. Feeling weak now.  Can't get into see OB until the first of the year

## 2020-11-30 ENCOUNTER — Inpatient Hospital Stay (HOSPITAL_COMMUNITY): Payer: Medicaid Other

## 2020-11-30 ENCOUNTER — Encounter (HOSPITAL_COMMUNITY): Payer: Self-pay | Admitting: Obstetrics & Gynecology

## 2020-11-30 ENCOUNTER — Inpatient Hospital Stay (HOSPITAL_COMMUNITY)
Admission: AD | Admit: 2020-11-30 | Discharge: 2020-11-30 | Disposition: A | Discharge status: Home / Self Care | Payer: Medicaid Other | Attending: Obstetrics & Gynecology | Admitting: Obstetrics & Gynecology

## 2020-11-30 ENCOUNTER — Other Ambulatory Visit: Payer: Self-pay | Admitting: Student

## 2020-11-30 ENCOUNTER — Other Ambulatory Visit: Payer: Self-pay

## 2020-11-30 DIAGNOSIS — O26899 Other specified pregnancy related conditions, unspecified trimester: Secondary | ICD-10-CM | POA: Diagnosis not present

## 2020-11-30 DIAGNOSIS — R112 Nausea with vomiting, unspecified: Secondary | ICD-10-CM | POA: Insufficient documentation

## 2020-11-30 DIAGNOSIS — Z3A00 Weeks of gestation of pregnancy not specified: Secondary | ICD-10-CM | POA: Diagnosis not present

## 2020-11-30 DIAGNOSIS — Z3A01 Less than 8 weeks gestation of pregnancy: Secondary | ICD-10-CM | POA: Diagnosis not present

## 2020-11-30 DIAGNOSIS — R109 Unspecified abdominal pain: Secondary | ICD-10-CM | POA: Diagnosis not present

## 2020-11-30 DIAGNOSIS — O99619 Diseases of the digestive system complicating pregnancy, unspecified trimester: Secondary | ICD-10-CM | POA: Insufficient documentation

## 2020-11-30 DIAGNOSIS — K5900 Constipation, unspecified: Secondary | ICD-10-CM | POA: Diagnosis not present

## 2020-11-30 DIAGNOSIS — R102 Pelvic and perineal pain: Secondary | ICD-10-CM | POA: Insufficient documentation

## 2020-11-30 DIAGNOSIS — O26891 Other specified pregnancy related conditions, first trimester: Secondary | ICD-10-CM | POA: Diagnosis not present

## 2020-11-30 DIAGNOSIS — Z8742 Personal history of other diseases of the female genital tract: Secondary | ICD-10-CM

## 2020-11-30 DIAGNOSIS — O219 Vomiting of pregnancy, unspecified: Secondary | ICD-10-CM

## 2020-11-30 HISTORY — DX: Headache, unspecified: R51.9

## 2020-11-30 HISTORY — DX: Urinary tract infection, site not specified: N39.0

## 2020-11-30 LAB — WET PREP, GENITAL
Clue Cells Wet Prep HPF POC: NONE SEEN
Sperm: NONE SEEN
Trich, Wet Prep: NONE SEEN
WBC, Wet Prep HPF POC: <10 (ref <10)
Yeast Wet Prep HPF POC: NONE SEEN

## 2020-11-30 LAB — CBC
HCT: 36.9 % (ref 36.0–46.0)
Hemoglobin: 12.1 g/dL (ref 12.0–15.0)
MCH: 29.7 pg (ref 26.0–34.0)
MCHC: 32.8 g/dL (ref 30.0–36.0)
MCV: 90.4 fL (ref 80.0–100.0)
Platelets: 355 10*3/uL (ref 150–400)
RBC: 4.08 MIL/uL (ref 3.87–5.11)
RDW: 13.1 % (ref 11.5–15.5)
WBC: 8 10*3/uL (ref 4.0–10.5)
nRBC: 0 % (ref 0.0–0.2)

## 2020-11-30 LAB — COMPREHENSIVE METABOLIC PANEL
ALT: 15 U/L (ref 0–44)
AST: 17 U/L (ref 15–41)
Albumin: 3.2 g/dL — ABNORMAL LOW (ref 3.5–5.0)
Alkaline Phosphatase: 64 U/L (ref 38–126)
Anion gap: 5 (ref 5–15)
BUN: 5 mg/dL — ABNORMAL LOW (ref 6–20)
CO2: 23 mmol/L (ref 22–32)
Calcium: 8.9 mg/dL (ref 8.9–10.3)
Chloride: 106 mmol/L (ref 98–111)
Creatinine, Ser: 0.68 mg/dL (ref 0.44–1.00)
GFR, Estimated: >60 mL/min (ref >60)
Glucose, Bld: 98 mg/dL (ref 70–99)
Potassium: 3.7 mmol/L (ref 3.5–5.1)
Sodium: 134 mmol/L — ABNORMAL LOW (ref 135–145)
Total Bilirubin: 0.3 mg/dL (ref 0.3–1.2)
Total Protein: 7 g/dL (ref 6.5–8.1)

## 2020-11-30 LAB — HCG, QUANTITATIVE, PREGNANCY: hCG, Beta Chain, Quant, S: 35911 m[IU]/mL — ABNORMAL HIGH (ref <5)

## 2020-11-30 NOTE — MAU Note (Signed)
Bright red spotting x2 this morning.  Did take the Miralax last night, has not gone yet.   Did not get the Dulcolax, phenergan is on back order

## 2020-11-30 NOTE — MAU Provider Note (Signed)
Patient Sierra Evans is a 30 y.o. [phone]  At [redacted]w[redacted]d here with complaints of vaginal bleeding and abdominal pain. She is not certain about her LMP. The abdominal  paincould be related to her constipation (see note from yesterday) and the bleeding is possibly rectal, although she isn't sure. She reports it was just some spotting on her toilet paper this morning which has now resolved.  She denies vomiting, occasional vomiting. She denies dysuria, fever, SOB, chest pain, other abnormal discharge.   She was seen yesterday in MAU for constipation and given colace and Miralax. She took it last night but has not had a BM.  History     CSN: [id number]  Arrival date and time: 11/30/20 0946   None     Chief Complaint  Patient presents with   Vaginal Bleeding   Vaginal Bleeding The patient's primary symptoms include vaginal bleeding. This is a new problem. The current episode started yesterday. Associated symptoms include abdominal pain, constipation, nausea and vomiting. Pertinent negatives include no back pain, chills, diarrhea, dysuria, fever, sore throat or urgency.  Abdominal Pain This is a new problem. The current episode started yesterday. The problem occurs constantly. The problem has been gradually worsening. The pain is located in the suprapubic region. The pain is at a severity of 0/10 (at home it was an 8-9/10). Pain radiation: shoots up her stomach. Associated symptoms include constipation, nausea and vomiting. Pertinent negatives include no diarrhea, dysuria or fever.   OB History     Gravida  4   Para  3   Term  3   Preterm      AB  0   Living  3      SAB      IAB      Ectopic  0   Multiple      Live Births  3           Past Medical History:  Diagnosis Date   Asthma    Headache    Scoliosis    UTI (urinary tract infection)     Past Surgical History:  Procedure Laterality Date   cervix scrape     ? D&C "for bleeding"   CESAREAN SECTION       Family History  Problem Relation Age of Onset   Alcohol abuse Mother    Cirrhosis Mother    COPD Father    Cancer Brother    Cancer Maternal Grandfather    Stroke Maternal Grandfather    Hypertension Maternal Grandfather    Cancer Paternal Grandmother     Social History   Tobacco Use   Smoking status: Never   Smokeless tobacco: Never  Vaping Use   Vaping Use: Never used  Substance Use Topics   Alcohol use: No   Drug use: No    Allergies:  Allergies  Allergen Reactions   Codeine Hives   Hydrocodone Nausea And Vomiting   Strawberry Extract Hives    No medications prior to admission.    Review of Systems  Constitutional:  Negative for chills and fever.  HENT:  Negative for sore throat.   Gastrointestinal:  Positive for abdominal pain, constipation, nausea and vomiting. Negative for diarrhea.  Genitourinary:  Positive for vaginal bleeding. Negative for dysuria and urgency.  Musculoskeletal:  Negative for back pain.  Physical Exam   Blood pressure 108/81, pulse 69, temperature 98.3 F (36.8 C), temperature source Oral, resp. rate 18, height 5\' 2"  (1.575 m), weight 90.9 kg, last  menstrual period 10/23/2020, SpO2 100 %.  Physical Exam Constitutional:      Appearance: Normal appearance.  Cardiovascular:     Rate and Rhythm: Normal rate.  Pulmonary:     Effort: Pulmonary effort is normal.  Abdominal:     General: Abdomen is flat.  Musculoskeletal:        General: Normal range of motion.     Cervical back: Normal range of motion.  Skin:    General: Skin is warm.  Neurological:     General: No focal deficit present.     Mental Status: She is alert.  Psychiatric:        Mood and Affect: Mood normal.        Behavior: Behavior normal.   MAU Course  Procedures  MDM -due to patient's complaint, will do complete ectopic work-up -US shows gestational sac but no yolk sac, beta is 35,000. Discussed with Dr. Debroah Loop that pregnancy status is unclear; most likely  IUP but no confirmed YS. Technically cannot exclude ectopic pregnancy -patient had no episodes of bleeding while in MAU; pelvic exam not done    Assessment and Plan   1. Abdominal cramping affecting pregnancy   2. Nausea and vomiting during pregnancy prior to [redacted] weeks gestation   3. History of vaginal bleeding   -patient understands that pregnancy status is unknown given elevated bHCG and GS but no YS; she agrees to come to [place] on 12/02/2020 at 8am for bchg in 48 hours -[place] ordered transvaginally for 14 days from now -bleeding and first trimester precautions reviewed -wet prep pending -gc ct pending -continue constipation management at home Charlesetta Garibaldi Shanon Seawright 11/30/2020, 2:57 PM

## 2020-11-30 NOTE — MAU Note (Signed)
Presents stating she went to use restroom to void and noticed VB with wiping.  Reports voided  again and no VB noted.

## 2020-12-01 LAB — CULTURE, OB URINE

## 2020-12-01 LAB — GC/CHLAMYDIA PROBE AMP (~~LOC~~) NOT AT ARMC
Chlamydia: NEGATIVE
Comment: NEGATIVE
Comment: NORMAL
Neisseria Gonorrhea: NEGATIVE

## 2020-12-02 ENCOUNTER — Telehealth: Payer: Self-pay

## 2020-12-02 ENCOUNTER — Other Ambulatory Visit: Payer: Self-pay

## 2020-12-02 ENCOUNTER — Other Ambulatory Visit: Payer: Medicaid Other

## 2020-12-02 DIAGNOSIS — R109 Unspecified abdominal pain: Secondary | ICD-10-CM

## 2020-12-02 LAB — BETA HCG QUANT (REF LAB): hCG Quant: 34503 m[IU]/mL

## 2020-12-02 NOTE — Telephone Encounter (Signed)
Called patient to discuss stat quant results. Patient informed that levels did decrease which is not promising. Per. Dr. Alysia Evans patient to return in 6 days 11/23 for a repeat stat quant.   Pain and bleeding precautions given and when to seek medical attention.  Patient verbalized understanding.

## 2020-12-02 NOTE — Progress Notes (Signed)
Pt presents for repeat STAT BHCG. Pt states she has not had any bleeding since leaving the hospital on 11/30/20. She is still having some lower pelvic pain right above her pubic symphysis. Pt was given ectopic precautions and advised should she develop any of the symptoms to go directly to MAU for evaluation. Will call patient with results once available.

## 2020-12-03 ENCOUNTER — Other Ambulatory Visit: Payer: Medicaid Other

## 2020-12-08 ENCOUNTER — Other Ambulatory Visit: Payer: Self-pay

## 2020-12-08 ENCOUNTER — Other Ambulatory Visit: Payer: Self-pay | Admitting: *Deleted

## 2020-12-08 ENCOUNTER — Other Ambulatory Visit (INDEPENDENT_AMBULATORY_CARE_PROVIDER_SITE_OTHER): Payer: Self-pay

## 2020-12-08 VITALS — BP 117/65 | HR 74

## 2020-12-08 DIAGNOSIS — Z3200 Encounter for pregnancy test, result unknown: Secondary | ICD-10-CM

## 2020-12-08 DIAGNOSIS — O3680X1 Pregnancy with inconclusive fetal viability, fetus 1: Secondary | ICD-10-CM

## 2020-12-08 DIAGNOSIS — Z3A08 8 weeks gestation of pregnancy: Secondary | ICD-10-CM | POA: Diagnosis not present

## 2020-12-08 DIAGNOSIS — O3680X0 Pregnancy with inconclusive fetal viability, not applicable or unspecified: Secondary | ICD-10-CM

## 2020-12-08 DIAGNOSIS — Z3401 Encounter for supervision of normal first pregnancy, first trimester: Secondary | ICD-10-CM | POA: Diagnosis not present

## 2020-12-08 LAB — BETA HCG QUANT (REF LAB): hCG Quant: 86447 m[IU]/mL

## 2020-12-08 NOTE — Progress Notes (Signed)
STAT bHCG result received and increased to 86,447. Per Dr. Jolayne Panther, notify patient and schedule [place] for viability next week. Patient notified and given bleeding and ectopic precautions. [place] scheduled for 12/15/20 @ 3:45 PM at [address]

## 2020-12-08 NOTE — Progress Notes (Signed)
Sierra Evans presents today for repeat bHCG. Previous encounter MAU for IUP w/ YS not visualized. Previous quant decreased from 35 to 34,000. Denies abdominal pain. Reports vomiting since concerns first started. Is able to keep down some food and fluids. Advised eating bland foods with small, frequent meals and focusing on hydration. Request earlier [place] appt. Discussed need to get quant back today and likelihood of a failed pregnancy. Patient verbalized understanding. Emotional support offered.

## 2020-12-15 ENCOUNTER — Ambulatory Visit: Admission: RE | Admit: 2020-12-15 | Payer: MEDICAID | Source: Ambulatory Visit

## 2020-12-24 ENCOUNTER — Other Ambulatory Visit: Payer: Self-pay

## 2020-12-24 ENCOUNTER — Ambulatory Visit (INDEPENDENT_AMBULATORY_CARE_PROVIDER_SITE_OTHER): Payer: Medicaid Other

## 2020-12-24 VITALS — BP 110/67 | HR 67 | Ht 62.0 in | Wt 201.0 lb

## 2020-12-24 DIAGNOSIS — Z3481 Encounter for supervision of other normal pregnancy, first trimester: Secondary | ICD-10-CM

## 2020-12-24 DIAGNOSIS — O219 Vomiting of pregnancy, unspecified: Secondary | ICD-10-CM

## 2020-12-24 DIAGNOSIS — Z3A09 9 weeks gestation of pregnancy: Secondary | ICD-10-CM

## 2020-12-24 DIAGNOSIS — O3680X0 Pregnancy with inconclusive fetal viability, not applicable or unspecified: Secondary | ICD-10-CM

## 2020-12-24 DIAGNOSIS — Z3490 Encounter for supervision of normal pregnancy, unspecified, unspecified trimester: Secondary | ICD-10-CM | POA: Insufficient documentation

## 2020-12-24 MED ORDER — BLOOD PRESSURE KIT DEVI: 1.0000 | 0 refills | Status: DC

## 2020-12-24 MED ORDER — DOXYLAMINE-PYRIDOXINE 10-10 MG PO TBEC: 2.0000 | DELAYED_RELEASE_TABLET | Freq: Every day | ORAL | 5 refills | Status: DC

## 2020-12-24 NOTE — Progress Notes (Signed)
Patient was assessed and managed by nursing staff during this encounter. I have reviewed the chart and agree with the documentation and plan. I have also made any necessary editorial changes.  Warden Fillers, MD 12/24/2020 2:06 PM

## 2020-12-24 NOTE — Progress Notes (Signed)
New OB Intake  I connected with  Sierra Evans on 12/24/20 at 10:15 AM EST by in person and verified that I am speaking with the correct person using two identifiers. Nurse is located at [facility] and pt is located at [place].  I discussed the limitations, risks, security and privacy concerns of performing an evaluation and management service by telephone and the availability of in person appointments. I also discussed with the patient that there may be a patient responsible charge related to this service. The patient expressed understanding and agreed to proceed.  I explained I am completing New OB Intake today. We discussed her EDD of 07/30/21 that is based on LMP of 10/23/20. Pt is G4/P3003. I reviewed her allergies, medications, Medical/Surgical/OB history, and appropriate screenings. I informed her of [facility] services. Based on history, this is a/an  pregnancy uncomplicated .   Patient Active Problem List   Diagnosis Date Noted   Supervision of normal pregnancy 12/24/2020   Nausea and vomiting during pregnancy prior to [redacted] weeks gestation 07/08/2014    Concerns addressed today  Delivery Plans:  Plans to deliver at [facility].   MyChart/Babyscripts MyChart access verified. I explained pt will have some visits in office and some virtually. Babyscripts instructions given and order placed. Patient verifies receipt of registration text/e-mail. Account successfully created and app downloaded.  Blood Pressure Cuff  Blood pressure cuff ordered for patient to pick-up from [employer]. Explained after first prenatal appt pt will check weekly and document in Babyscripts.  Weight scale: Patient does have weight scale at home.  Anatomy US Explained first scheduled [place] will be around 19 weeks. Dating and viability scan performed  Labs Discussed Natera genetic screening with patient. Would like both Panorama and Horizon drawn at new OB visit. Routine prenatal labs needed.  Covid  Vaccine Patient has covid vaccine.    Informed patient of [facility] website  and placed link in her AVS.   Social Determinants of Health Food Insecurity: Patient denies food insecurity. WIC Referral: Patient is interested in referral to [facility].  Transportation: Patient denies transportation needs. Childcare: Discussed no children allowed at ultrasound appointments. Offered childcare services; patient declines childcare services at this time.  Send link to Pregnancy Navigators   Placed OB Box on problem list and updated  First visit review I reviewed new OB appt with pt. I explained she will have a pelvic exam, ob bloodwork with genetic screening, and PAP smear. Explained pt will be seen by Leticia Penna at first visit; encounter routed to appropriate provider. Explained that patient will be seen by pregnancy navigator following visit with provider. [facility] information placed in AVS.   Hamilton Capri, RN 12/24/2020  11:09 AM

## 2021-01-04 ENCOUNTER — Other Ambulatory Visit: Payer: Self-pay

## 2021-01-04 ENCOUNTER — Ambulatory Visit (INDEPENDENT_AMBULATORY_CARE_PROVIDER_SITE_OTHER): Payer: Medicaid Other | Admitting: Family Medicine

## 2021-01-04 ENCOUNTER — Other Ambulatory Visit (HOSPITAL_COMMUNITY)
Admission: RE | Admit: 2021-01-04 | Discharge: 2021-01-04 | Disposition: A | Discharge status: Home / Self Care | Payer: Medicaid Other | Source: Ambulatory Visit | Attending: Family Medicine | Admitting: Family Medicine

## 2021-01-04 VITALS — BP 114/73 | HR 97 | Wt 200.0 lb

## 2021-01-04 DIAGNOSIS — Z3481 Encounter for supervision of other normal pregnancy, first trimester: Secondary | ICD-10-CM | POA: Diagnosis not present

## 2021-01-04 DIAGNOSIS — Z3A10 10 weeks gestation of pregnancy: Secondary | ICD-10-CM

## 2021-01-04 DIAGNOSIS — Z98891 History of uterine scar from previous surgery: Secondary | ICD-10-CM

## 2021-01-04 DIAGNOSIS — B349 Viral infection, unspecified: Secondary | ICD-10-CM

## 2021-01-04 DIAGNOSIS — Z124 Encounter for screening for malignant neoplasm of cervix: Secondary | ICD-10-CM | POA: Insufficient documentation

## 2021-01-04 DIAGNOSIS — Z31430 Encounter of female for testing for genetic disease carrier status for procreative management: Secondary | ICD-10-CM | POA: Diagnosis not present

## 2021-01-04 DIAGNOSIS — O219 Vomiting of pregnancy, unspecified: Secondary | ICD-10-CM

## 2021-01-04 NOTE — Progress Notes (Signed)
History:   Sierra Evans is a 30 y.o. [phone] at 42w3dby LMP being seen today for her first obstetrical visit.  Her obstetrical history is significant for  history of C/s due to fetal intolerance in 2017 (reports she was around 4 cm . Patient does intend to breast feed. Considering a BTL for contraception. Pregnancy history fully reviewed.  Patient reports nausea/vomiting and new cold symptoms (since yesterday: cough congestion). Denies any fever or difficulty breathing. She has been using phenergan for N/V with some success, waiting for her insurance to accept diclegis.       HISTORY: OB History  Gravida Para Term Preterm AB Living  4 3 3  0 0 3  SAB IAB Ectopic Multiple Live Births  0 0 0 0 3    # Outcome Date GA Lbr Len/2nd Weight Sex Delivery Anes PTL Lv  4 Current           3 Term 02/23/15    F CS-Unspec  N LIV     Complications: Fetal Intolerance  2 Term 04/18/11    M Vag-Spont   LIV  1 Term 04/02/09    M Vag-Spont   LIV    Last pap smear was done about 2 years ago per patient, do not have records.   Past Medical History:  Diagnosis Date   Asthma    Headache    Scoliosis    UTI (urinary tract infection)    Past Surgical History:  Procedure Laterality Date   cervix scrape     ? D&C "for bleeding"   CESAREAN SECTION     Family History  Problem Relation Age of Onset   Alcohol abuse Mother    Cirrhosis Mother    COPD Father    Cancer Brother    Cancer Maternal Grandfather    Stroke Maternal Grandfather    Hypertension Maternal Grandfather    Cancer Paternal Grandmother    Social History   Tobacco Use   Smoking status: Never   Smokeless tobacco: Never  Vaping Use   Vaping Use: Never used  Substance Use Topics   Alcohol use: No   Drug use: No   Allergies  Allergen Reactions   Codeine Hives   Hydrocodone Nausea And Vomiting   Strawberry Extract Hives   Current Outpatient Medications on File Prior to Visit  Medication Sig Dispense Refill    albuterol (VENTOLIN HFA) 108 (90 Base) MCG/ACT inhaler Inhale 1-2 puffs into the lungs every 6 (six) hours as needed for wheezing or shortness of breath. 1 each 0   bisacodyl (DULCOLAX) 10 MG suppository Place 1 suppository (10 mg total) rectally as needed for moderate constipation. (Patient not taking: Reported on 12/24/2020) 12 suppository 0   Blood Pressure Monitoring (BLOOD PRESSURE KIT) DEVI 1 kit by Does not apply route once a week. 1 each 0   Doxylamine-Pyridoxine (DICLEGIS) 10-10 MG TBEC Take 2 tablets by mouth at bedtime. If symptoms persist, add one tablet in the morning and one in the afternoon 100 tablet 5   polyethylene glycol (MIRALAX / GLYCOLAX) 17 g packet Take 17 g by mouth daily. (Patient not taking: Reported on 12/24/2020) 14 each 0   Prenatal MV & Min w/FA-DHA (PRENATAL GUMMIES PO) Take by mouth.     Prenatal Vit-Fe Fumarate-FA (MULTIVITAMIN-PRENATAL) 27-0.8 MG TABS tablet Take 1 tablet by mouth daily at 12 noon.     promethazine (PHENERGAN) 25 MG tablet Take 1 tablet (25 mg total) by mouth every 6 (six)  hours as needed for nausea or vomiting. 30 tablet 2   [DISCONTINUED] ferrous sulfate 325 (65 FE) MG tablet Take by mouth.     [DISCONTINUED] loratadine (CLARITIN) 10 MG tablet Take by mouth.     No current facility-administered medications on file prior to visit.   Indications for ASA therapy (per uptodate) One of the following: Previous pregnancy with preeclampsia, especially early onset and with an adverse outcome No Multifetal gestation No Chronic hypertension No Type 1 or 2 diabetes mellitus No Chronic kidney disease No Autoimmune disease (antiphospholipid syndrome, systemic lupus erythematosus) No  Two or more of the following: Nulliparity No Obesity (body mass index >30 kg/m2) Yes Family history of preeclampsia in mother or sister No Age ?35 years No Sociodemographic characteristics (African American race, low socioeconomic level) Yes Personal risk factors (eg,  previous pregnancy with low birth weight or small for gestational age infant, previous adverse pregnancy outcome [eg, stillbirth], interval >10 years between pregnancies) No   Review of Systems Pertinent items noted in HPI and remainder of comprehensive ROS otherwise negative. Physical Exam:   Vitals:   01/04/21 1428  BP: 114/73  Pulse: 97  Weight: 200 lb (90.7 kg)   Fetal Heart Rate (bpm): 160  Constitutional: Well-developed, well-nourished pregnant female in no acute distress.  HEENT: PERRLA, nasal congestion present  Skin: normal color and turgor, no rash Cardiovascular: normal rate  Respiratory: normal effort throughout without cough during exam  GI: Abd soft, non-tender, gravid appropriate for gestational age MS: Extremities nontender, no edema, normal ROM Neurologic: Alert and oriented x 4.  GU: no CVA tenderness Pelvic: NEFG, physiologic discharge, no blood, cervix clean. Pap/swabs collected. Chaperone present.   Assessment:    Pregnancy: [id number] Patient Active Problem List   Diagnosis Date Noted   Supervision of normal pregnancy 12/24/2020     Plan:    1. Encounter for supervision of other normal pregnancy in first trimester Doing well overall. Start ASA at 12 weeks.  - Genetic Screening - Culture, OB Urine - Obstetric Panel, Including HIV - Hepatitis C Antibody - Hemoglobin A1c - [place] MFM OB DETAIL +14 WK; Future  2. Encounter for screening for cervical cancer - Cytology - PAP( [place])  3. Nausea and vomiting in pregnancy Phenergan at home and waiting for diclegis. Discussed adequate hydration, small freq meals, ginger, and sea bands.   4. Viral illness New congestion/cough. Breathing comfortably. Provided with OTC list safe for pregnancy. COVID test at home. F/u if worsening/difficulty breathing.   5. History of c-section D/t fetal intolerance in 2017. Considering VBAC. Discussed rare but possible risk of uterine rupture, she is going to continue to  decide.  6. [redacted] weeks gestation of pregnancy   Initial labs drawn. Continue prenatal vitamins. Problem list reviewed and updated. Genetic Screening discussed: ordered. Ultrasound discussed; fetal anatomic survey: ordered. Anticipatory guidance about prenatal visits given including labs, ultrasounds, and testing. Discussed usage of Babyscripts and virtual visits as additional source of managing and completing prenatal visits in midst of coronavirus and pandemic.   Encouraged to complete MyChart Registration for her ability to review results, send requests, and have questions addressed.  The nature of [place] for [facility] Healthcare/Faculty Practice with multiple MDs and Advanced Practice Providers was explained to patient; also emphasized that residents, students are part of our team. Routine obstetric precautions reviewed. Encouraged to seek out care at office or emergency room [facility] preferred) for urgent and/or emergent concerns.  Return in about 4 weeks (  around 02/01/2021) for LROB.     Darrelyn Hillock, DO Ob Fellow   01/05/2021  10:49 PM

## 2021-01-04 NOTE — Progress Notes (Signed)
NEW OB.  She will get FLU vaccine next  visit; she might have a cold.

## 2021-01-04 NOTE — Patient Instructions (Signed)
Safe Medications in Pregnancy   Acne: Benzoyl Peroxide Salicylic Acid  Backache/Headache: Tylenol: 2 regular strength every 4 hours OR              2 Extra strength every 6 hours  Colds/Coughs/Allergies: Benadryl (alcohol free) 25 mg every 6 hours as needed Breath right strips Claritin Cepacol throat lozenges Chloraseptic throat spray Cold-Eeze- up to three times per day Cough drops, alcohol free Flonase (by prescription only) Guaifenesin Mucinex Robitussin DM (plain only, alcohol free) Saline nasal spray/drops Sudafed (pseudoephedrine) & Actifed ** use only after [redacted] weeks gestation and if you do not have high blood pressure Tylenol Vicks Vaporub Zinc lozenges Zyrtec   Constipation: Colace Ducolax suppositories Fleet enema Glycerin suppositories Metamucil Milk of magnesia Miralax Senokot Smooth move tea  Diarrhea: Kaopectate Imodium A-D  *NO pepto Bismol  Hemorrhoids: Anusol Anusol HC Preparation H Tucks  Indigestion: Tums Maalox Mylanta Zantac  Pepcid  Insomnia: Benadryl (alcohol free) 25mg every 6 hours as needed Tylenol PM Unisom, no Gelcaps  Leg Cramps: Tums MagGel  Nausea/Vomiting:  Bonine Dramamine Emetrol Ginger extract Sea bands Meclizine  Nausea medication to take during pregnancy:  Unisom (doxylamine succinate 25 mg tablets) Take one tablet daily at bedtime. If symptoms are not adequately controlled, the dose can be increased to a maximum recommended dose of two tablets daily (1/2 tablet in the morning, 1/2 tablet mid-afternoon and one at bedtime). Vitamin B6 100mg tablets. Take one tablet twice a day (up to 200 mg per day).  Skin Rashes: Aveeno products Benadryl cream or 25mg every 6 hours as needed Calamine Lotion 1% Hydrocortisone cream  Yeast infection: Gyne-lotrimin 7 Monistat 7   **If taking multiple medications, please check labels to avoid duplicating the same active ingredients **take medication as directed  on the label ** Do not exceed 4000 mg of tylenol in 24 hours **Do not take medications that contain aspirin or ibuprofen    

## 2021-01-05 ENCOUNTER — Encounter: Payer: Self-pay | Admitting: Family Medicine

## 2021-01-05 DIAGNOSIS — Z98891 History of uterine scar from previous surgery: Secondary | ICD-10-CM | POA: Insufficient documentation

## 2021-01-05 LAB — OBSTETRIC PANEL, INCLUDING HIV
Antibody Screen: NEGATIVE
Basophils Absolute: 0 10*3/uL (ref 0.0–0.2)
Basos: 1 %
EOS (ABSOLUTE): 0.2 10*3/uL (ref 0.0–0.4)
Eos: 3 %
HIV Screen 4th Generation wRfx: NONREACTIVE
Hematocrit: 36.2 % (ref 34.0–46.6)
Hemoglobin: 12.2 g/dL (ref 11.1–15.9)
Hepatitis B Surface Ag: NEGATIVE
Immature Grans (Abs): 0.1 10*3/uL (ref 0.0–0.1)
Immature Granulocytes: 1 %
Lymphocytes Absolute: 0.9 10*3/uL (ref 0.7–3.1)
Lymphs: 14 %
MCH: 29.9 pg (ref 26.6–33.0)
MCHC: 33.7 g/dL (ref 31.5–35.7)
MCV: 89 fL (ref 79–97)
Monocytes Absolute: 0.6 10*3/uL (ref 0.1–0.9)
Monocytes: 9 %
Neutrophils Absolute: 4.4 10*3/uL (ref 1.4–7.0)
Neutrophils: 72 %
Platelets: 334 10*3/uL (ref 150–450)
RBC: 4.08 x10E6/uL (ref 3.77–5.28)
RDW: 13.7 % (ref 11.7–15.4)
RPR Ser Ql: NONREACTIVE
Rh Factor: POSITIVE
Rubella Antibodies, IGG: 1.99 index (ref >0.99)
WBC: 6 10*3/uL (ref 3.4–10.8)

## 2021-01-05 LAB — HEMOGLOBIN A1C
Est. average glucose Bld gHb Est-mCnc: 126 mg/dL
Hgb A1c MFr Bld: 6 % — ABNORMAL HIGH (ref 4.8–5.6)

## 2021-01-05 LAB — HEPATITIS C ANTIBODY: Hep C Virus Ab: <0.1 s/co ratio (ref 0.0–0.9)

## 2021-01-05 MED ORDER — ASPIRIN EC 81 MG PO TBEC: 81.0000 mg | DELAYED_RELEASE_TABLET | Freq: Every day | ORAL | 3 refills | Status: DC

## 2021-01-06 LAB — URINE CULTURE, OB REFLEX

## 2021-01-06 LAB — CULTURE, OB URINE

## 2021-01-07 LAB — CYTOLOGY - PAP
Comment: NEGATIVE
Diagnosis: NEGATIVE
High risk HPV: NEGATIVE

## 2021-01-16 NOTE — L&D Delivery Note (Signed)
Delivery Note Progressed to complete dilation and pushed well to delivery.  At 7:23 AM a viable and healthy female was delivered via VBAC, Spontaneous (Presentation: Left Occiput Anterior).  APGAR: , ; weight  .   Placenta status: Spontaneous, Intact.  Cord: 3 vessels with the following complications: nuchal cord x 1  Anesthesia: Epidural Episiotomy: None Lacerations: None Suture Repair:  none Est. Blood Loss (mL):  100  Mom to postpartum.  Baby to Couplet care / Skin to Skin.  Hansel Feinstein 06/26/2021, 7:48 AM

## 2021-01-18 ENCOUNTER — Encounter: Payer: Self-pay | Admitting: *Deleted

## 2021-01-20 ENCOUNTER — Encounter: Payer: Self-pay | Admitting: Family Medicine

## 2021-02-01 ENCOUNTER — Encounter: Payer: Medicaid Other | Admitting: Advanced Practice Midwife

## 2021-02-01 ENCOUNTER — Other Ambulatory Visit: Payer: Medicaid Other

## 2021-02-01 NOTE — Progress Notes (Deleted)
° °  PRENATAL VISIT NOTE  Subjective:  Sierra Evans is a 31 y.o. [phone] at [redacted]w[redacted]d being seen today for ongoing prenatal care.  She is currently monitored for the following issues for this {Blank single:19197::"high-risk","low-risk"} pregnancy and has Supervision of normal pregnancy and History of C-section on their problem list.  Patient reports {sx:14538}.   .  .   . Denies leaking of fluid.   The following portions of the patient's history were reviewed and updated as appropriate: allergies, current medications, past family history, past medical history, past social history, past surgical history and problem list.   Objective:  There were no vitals filed for this visit.  Fetal Status:           General:  Alert, oriented and cooperative. Patient is in no acute distress.  Skin: Skin is warm and dry. No rash noted.   Cardiovascular: Normal heart rate noted  Respiratory: Normal respiratory effort, no problems with respiration noted  Abdomen: Soft, gravid, appropriate for gestational age.        Pelvic: {Blank single:19197::"Cervical exam performed in the presence of a chaperone","Cervical exam deferred"}        Extremities: Normal range of motion.     Mental Status: Normal mood and affect. Normal behavior. Normal judgment and thought content.   Assessment and Plan:  Pregnancy: G4P3003 at [redacted]w[redacted]d 1. [redacted] weeks gestation of pregnancy ***  2. Encounter for supervision of other normal pregnancy in first trimester ***  3. History of C-section ***  {Blank single:19197::"Term","Preterm"} labor symptoms and general obstetric precautions including but not limited to vaginal bleeding, contractions, leaking of fluid and fetal movement were reviewed in detail with the patient. Please refer to After Visit Summary for other counseling recommendations.   No follow-ups on file.  Future Appointments  Date Time Provider [place]  02/01/2021  8:55 AM Leftwich-Kirby, Kathie Dike, CNM [facility]  None  03/08/2021  8:15 AM [facility] NURSE [facility]  03/08/2021  8:30 AM [facility] US3 [facility]    Fatima Blank, CNM

## 2021-02-09 ENCOUNTER — Encounter: Payer: Medicaid Other | Admitting: Obstetrics

## 2021-02-09 ENCOUNTER — Other Ambulatory Visit: Payer: Medicaid Other

## 2021-02-15 ENCOUNTER — Ambulatory Visit (INDEPENDENT_AMBULATORY_CARE_PROVIDER_SITE_OTHER): Payer: BC Managed Care – PPO | Admitting: Obstetrics & Gynecology

## 2021-02-15 ENCOUNTER — Other Ambulatory Visit: Payer: Self-pay

## 2021-02-15 ENCOUNTER — Other Ambulatory Visit: Payer: Medicaid Other

## 2021-02-15 VITALS — BP 109/75 | HR 74 | Wt 206.0 lb

## 2021-02-15 DIAGNOSIS — Z3A16 16 weeks gestation of pregnancy: Secondary | ICD-10-CM | POA: Diagnosis not present

## 2021-02-15 DIAGNOSIS — Z3481 Encounter for supervision of other normal pregnancy, first trimester: Secondary | ICD-10-CM | POA: Diagnosis not present

## 2021-02-15 DIAGNOSIS — Z98891 History of uterine scar from previous surgery: Secondary | ICD-10-CM

## 2021-02-15 NOTE — Progress Notes (Signed)
° °  PRENATAL VISIT NOTE  Subjective:  Sierra Evans is a 31 y.o. [phone] at [redacted]w[redacted]d being seen today for ongoing prenatal care.  She is currently monitored for the following issues for this low-risk pregnancy and has Supervision of normal pregnancy and History of C-section on their problem list.  Patient reports no complaints.  Contractions: Not present. Vag. Bleeding: None.   . Denies leaking of fluid.   The following portions of the patient's history were reviewed and updated as appropriate: allergies, current medications, past family history, past medical history, past social history, past surgical history and problem list.   Objective:   Vitals:   02/15/21 0822  BP: 109/75  Pulse: 74  Weight: 206 lb (93.4 kg)    Fetal Status: Fetal Heart Rate (bpm): 145         General:  Alert, oriented and cooperative. Patient is in no acute distress.  Skin: Skin is warm and dry. No rash noted.   Cardiovascular: Normal heart rate noted  Respiratory: Normal respiratory effort, no problems with respiration noted  Abdomen: Soft, gravid, appropriate for gestational age.  Pain/Pressure: Absent     Pelvic: Cervical exam deferred        Extremities: Normal range of motion.  Edema: Trace  Mental Status: Normal mood and affect. Normal behavior. Normal judgment and thought content.   Assessment and Plan:  Pregnancy: G4P3003 at [redacted]w[redacted]d 1. Encounter for supervision of other normal pregnancy in first trimester Screening for DM due to prediabetic A1c level  2. History of C-section Considering VBAC  Preterm labor symptoms and general obstetric precautions including but not limited to vaginal bleeding, contractions, leaking of fluid and fetal movement were reviewed in detail with the patient. Please refer to After Visit Summary for other counseling recommendations.   Return in about 4 weeks (around 03/15/2021).  Future Appointments  Date Time Provider Department Center  02/15/2021  8:55 AM Adam Phenix, MD [facility] None  03/08/2021  8:15 AM [facility] NURSE [facility]  03/08/2021  8:30 AM [facility] US3 [facility]    Scheryl Darter, MD

## 2021-02-17 LAB — AFP, SERUM, OPEN SPINA BIFIDA
AFP MoM: 1.05
AFP Value: 34.4 ng/mL
Gest. Age on Collection Date: 16.3 weeks
Maternal Age At EDD: 33.4 yr
OSBR Risk 1 IN: 10000
Test Results:: NEGATIVE
Weight: 206 [lb_av]

## 2021-02-17 LAB — GLUCOSE TOLERANCE, 2 HOURS W/ 1HR
Glucose, 1 hour: 131 mg/dL (ref 70–179)
Glucose, 2 hour: 114 mg/dL (ref 70–152)
Glucose, Fasting: 90 mg/dL (ref 70–91)

## 2021-03-08 ENCOUNTER — Ambulatory Visit: Payer: BC Managed Care – PPO | Admitting: *Deleted

## 2021-03-08 ENCOUNTER — Encounter: Payer: Self-pay | Admitting: *Deleted

## 2021-03-08 ENCOUNTER — Ambulatory Visit: Payer: BC Managed Care – PPO | Attending: Family Medicine

## 2021-03-08 ENCOUNTER — Other Ambulatory Visit: Payer: Self-pay | Admitting: *Deleted

## 2021-03-08 ENCOUNTER — Other Ambulatory Visit: Payer: Self-pay

## 2021-03-08 VITALS — BP 108/60 | HR 86

## 2021-03-08 DIAGNOSIS — Z3A19 19 weeks gestation of pregnancy: Secondary | ICD-10-CM | POA: Diagnosis not present

## 2021-03-08 DIAGNOSIS — O34219 Maternal care for unspecified type scar from previous cesarean delivery: Secondary | ICD-10-CM | POA: Diagnosis not present

## 2021-03-08 DIAGNOSIS — Z363 Encounter for antenatal screening for malformations: Secondary | ICD-10-CM | POA: Diagnosis not present

## 2021-03-08 DIAGNOSIS — B3731 Acute candidiasis of vulva and vagina: Secondary | ICD-10-CM

## 2021-03-08 DIAGNOSIS — Z3481 Encounter for supervision of other normal pregnancy, first trimester: Secondary | ICD-10-CM | POA: Diagnosis not present

## 2021-03-08 DIAGNOSIS — Z6841 Body Mass Index (BMI) 40.0 and over, adult: Secondary | ICD-10-CM

## 2021-03-08 DIAGNOSIS — O99212 Obesity complicating pregnancy, second trimester: Secondary | ICD-10-CM | POA: Diagnosis not present

## 2021-03-08 MED ORDER — TERCONAZOLE 0.8 % VA CREA: 1.0000 | TOPICAL_CREAM | Freq: Every day | VAGINAL | 0 refills | Status: AC

## 2021-03-09 ENCOUNTER — Ambulatory Visit (INDEPENDENT_AMBULATORY_CARE_PROVIDER_SITE_OTHER): Payer: BC Managed Care – PPO | Admitting: Licensed Clinical Social Worker

## 2021-03-09 DIAGNOSIS — Z658 Other specified problems related to psychosocial circumstances: Secondary | ICD-10-CM

## 2021-03-10 ENCOUNTER — Encounter: Payer: BC Managed Care – PPO | Admitting: Obstetrics and Gynecology

## 2021-03-10 NOTE — BH Specialist Note (Signed)
Integrated Behavioral Health via Telemedicine Visit  03/10/2021 Sierra Evans [id number]  Number of [place] Clinician visits: 1 Session Start time:  151pm Session End time: 212pm Total time in minutes: 21 mins via mychart video   Referring Provider: Dr. Roselie Awkward  Patient/Family location: Home  [facility] Provider location: [place]  All persons participating in visit: Pt G Achor and LCSW A. Elajah Kunsman  Types of Service: Individual psychotherapy  I connected with [name] and/or Judie Grieve Seybold's n/a via  Telephone or Geologist, engineering  (Video is Tree surgeon) and verified that I am speaking with the correct person using two identifiers. Discussed confidentiality: No   I discussed the limitations of telemedicine and the availability of in person appointments.  Discussed there is a possibility of technology failure and discussed alternative modes of communication if that failure occurs.  I discussed that engaging in this telemedicine visit, they consent to the provision of behavioral healthcare and the services will be billed under their insurance.  Patient and/or legal guardian expressed understanding and consented to Telemedicine visit: Yes   Presenting Concerns: Patient and/or family reports the following symptoms/concerns: feeling overwhelmed, pyschosocial stress, fatigue  Duration of problem: approx 3 weeks ; Severity of problem: mild  Patient and/or Family's Strengths/Protective Factors: Concrete supports in place (healthy food, safe environments, etc.)  Goals Addressed: Patient will:  Reduce symptoms of: stress   Increase knowledge and/or ability of: coping skills and stress reduction  Demonstrate ability to: Increase adequate support systems for patient/family  Progress towards Goals: Ongoing  Interventions: Interventions utilized:  Mindfulness or Relaxation Training and Supportive  Counseling Standardized Assessments completed: PHQ 9  Patient and/or Family Response: Ms. Noviello responded well to mychart visit.   Assessment: Patient currently experiencing psychosocial stress.   Patient may benefit from integrated behavioral health.  Plan: Follow up with behavioral health clinician on : as needed  Behavioral recommendations: Prioritize rest, communicate needs with partner for added support, develop self care routine and boudaries Referral(s): [place] (In Clinic)  I discussed the assessment and treatment plan with the patient and/or parent/guardian. They were provided an opportunity to ask questions and all were answered. They agreed with the plan and demonstrated an understanding of the instructions.   They were advised to call back or seek an in-person evaluation if the symptoms worsen or if the condition fails to improve as anticipated.  Lynnea Ferrier, LCSW

## 2021-03-15 ENCOUNTER — Other Ambulatory Visit: Payer: Self-pay

## 2021-03-15 ENCOUNTER — Ambulatory Visit (INDEPENDENT_AMBULATORY_CARE_PROVIDER_SITE_OTHER): Payer: BC Managed Care – PPO | Admitting: Obstetrics & Gynecology

## 2021-03-15 VITALS — BP 114/73 | HR 85 | Wt 207.0 lb

## 2021-03-15 DIAGNOSIS — Z3482 Encounter for supervision of other normal pregnancy, second trimester: Secondary | ICD-10-CM

## 2021-03-15 DIAGNOSIS — Z98891 History of uterine scar from previous surgery: Secondary | ICD-10-CM

## 2021-03-15 NOTE — Progress Notes (Signed)
ROB 20.3 wks  Recent fall on bottom, no abdominal pain or UC's, did not seek care Recent yeast infection, treatment effective per pt Wants to discuss work at home accommodations: reports long walk from car to building and to bathrooms, requests more frequent bathroom breaks.

## 2021-03-15 NOTE — Progress Notes (Signed)
° °  PRENATAL VISIT NOTE  Subjective:  Sierra Evans is a 31 y.o. [phone] at [redacted]w[redacted]d being seen today for ongoing prenatal care.  She is currently monitored for the following issues for this high-risk pregnancy and has Supervision of normal pregnancy and History of C-section on their problem list.  Patient reports no complaints.  Contractions: Not present. Vag. Bleeding: None.  Movement: Present. Denies leaking of fluid.   The following portions of the patient's history were reviewed and updated as appropriate: allergies, current medications, past family history, past medical history, past social history, past surgical history and problem list.   Objective:   Vitals:   03/15/21 0849  BP: 114/73  Pulse: 85  Weight: 207 lb (93.9 kg)    Fetal Status: Fetal Heart Rate (bpm): 135   Movement: Present     General:  Alert, oriented and cooperative. Patient is in no acute distress.  Skin: Skin is warm and dry. No rash noted.   Cardiovascular: Normal heart rate noted  Respiratory: Normal respiratory effort, no problems with respiration noted  Abdomen: Soft, gravid, appropriate for gestational age.  Pain/Pressure: Present     Pelvic: Cervical exam deferred        Extremities: Normal range of motion.  Edema: Trace  Mental Status: Normal mood and affect. Normal behavior. Normal judgment and thought content.   Assessment and Plan:  Pregnancy: G4P3003 at [redacted]w[redacted]d 1. History of C-section Plans TOLAC  2. Encounter for supervision of other normal pregnancy in second trimester F/u US 4 weeks  Preterm labor symptoms and general obstetric precautions including but not limited to vaginal bleeding, contractions, leaking of fluid and fetal movement were reviewed in detail with the patient. Please refer to After Visit Summary for other counseling recommendations.   Return in about 4 weeks (around 04/12/2021).  Future Appointments  Date Time Provider [place]  04/05/2021  9:15 AM [facility]  NURSE [facility]  04/05/2021  9:30 AM [facility] US3 [facility]    Emeterio Reeve, MD

## 2021-03-16 ENCOUNTER — Encounter: Payer: Self-pay | Admitting: Obstetrics & Gynecology

## 2021-03-23 ENCOUNTER — Encounter: Payer: Self-pay | Admitting: Obstetrics & Gynecology

## 2021-03-23 ENCOUNTER — Encounter: Payer: Self-pay | Admitting: Obstetrics

## 2021-03-23 ENCOUNTER — Inpatient Hospital Stay (HOSPITAL_COMMUNITY): Payer: BC Managed Care – PPO

## 2021-03-23 ENCOUNTER — Encounter (HOSPITAL_COMMUNITY): Payer: Self-pay | Admitting: Obstetrics and Gynecology

## 2021-03-23 ENCOUNTER — Other Ambulatory Visit: Payer: Self-pay | Admitting: Obstetrics & Gynecology

## 2021-03-23 ENCOUNTER — Other Ambulatory Visit: Payer: Self-pay

## 2021-03-23 ENCOUNTER — Inpatient Hospital Stay (HOSPITAL_COMMUNITY)
Admission: AD | Admit: 2021-03-23 | Discharge: 2021-03-23 | Disposition: A | Discharge status: Home / Self Care | Payer: BC Managed Care – PPO | Attending: Obstetrics and Gynecology | Admitting: Obstetrics and Gynecology

## 2021-03-23 DIAGNOSIS — O26892 Other specified pregnancy related conditions, second trimester: Secondary | ICD-10-CM | POA: Insufficient documentation

## 2021-03-23 DIAGNOSIS — J029 Acute pharyngitis, unspecified: Secondary | ICD-10-CM | POA: Diagnosis not present

## 2021-03-23 DIAGNOSIS — R058 Other specified cough: Secondary | ICD-10-CM | POA: Diagnosis not present

## 2021-03-23 DIAGNOSIS — Z7982 Long term (current) use of aspirin: Secondary | ICD-10-CM | POA: Insufficient documentation

## 2021-03-23 DIAGNOSIS — Z3A21 21 weeks gestation of pregnancy: Secondary | ICD-10-CM | POA: Diagnosis not present

## 2021-03-23 DIAGNOSIS — R059 Cough, unspecified: Secondary | ICD-10-CM | POA: Diagnosis not present

## 2021-03-23 DIAGNOSIS — R0789 Other chest pain: Secondary | ICD-10-CM | POA: Diagnosis not present

## 2021-03-23 DIAGNOSIS — R0602 Shortness of breath: Secondary | ICD-10-CM

## 2021-03-23 DIAGNOSIS — Z3492 Encounter for supervision of normal pregnancy, unspecified, second trimester: Secondary | ICD-10-CM

## 2021-03-23 DIAGNOSIS — J45909 Unspecified asthma, uncomplicated: Secondary | ICD-10-CM

## 2021-03-23 DIAGNOSIS — J4520 Mild intermittent asthma, uncomplicated: Secondary | ICD-10-CM

## 2021-03-23 DIAGNOSIS — Z20822 Contact with and (suspected) exposure to covid-19: Secondary | ICD-10-CM | POA: Insufficient documentation

## 2021-03-23 LAB — CBC WITH DIFFERENTIAL/PLATELET
Abs Immature Granulocytes: 0.12 10*3/uL — ABNORMAL HIGH (ref 0.00–0.07)
Basophils Absolute: 0 10*3/uL (ref 0.0–0.1)
Basophils Relative: 0 %
Eosinophils Absolute: 0.3 10*3/uL (ref 0.0–0.5)
Eosinophils Relative: 3 %
HCT: 31.3 % — ABNORMAL LOW (ref 36.0–46.0)
Hemoglobin: 10.5 g/dL — ABNORMAL LOW (ref 12.0–15.0)
Immature Granulocytes: 1 %
Lymphocytes Relative: 22 %
Lymphs Abs: 1.9 10*3/uL (ref 0.7–4.0)
MCH: 30.1 pg (ref 26.0–34.0)
MCHC: 33.5 g/dL (ref 30.0–36.0)
MCV: 89.7 fL (ref 80.0–100.0)
Monocytes Absolute: 0.6 10*3/uL (ref 0.1–1.0)
Monocytes Relative: 7 %
Neutro Abs: 5.6 10*3/uL (ref 1.7–7.7)
Neutrophils Relative %: 67 %
Platelets: 333 10*3/uL (ref 150–400)
RBC: 3.49 MIL/uL — ABNORMAL LOW (ref 3.87–5.11)
RDW: 13.2 % (ref 11.5–15.5)
WBC: 8.5 10*3/uL (ref 4.0–10.5)
nRBC: 0 % (ref 0.0–0.2)

## 2021-03-23 LAB — RESP PANEL BY RT-PCR (FLU A&B, COVID) ARPGX2
Influenza A by PCR: NEGATIVE
Influenza B by PCR: NEGATIVE
SARS Coronavirus 2 by RT PCR: NEGATIVE

## 2021-03-23 MED ORDER — GUAIFENESIN 100 MG/5ML PO LIQD
15.0000 mL | Freq: Once | ORAL | Status: AC
Start: 2021-03-23 — End: 2021-03-23
  Administered 2021-03-23: 15 mL via ORAL
  Filled 2021-03-23: qty 15

## 2021-03-23 MED ORDER — BENZONATATE 100 MG PO CAPS: 100.0000 mg | ORAL_CAPSULE | Freq: Three times a day (TID) | ORAL | 0 refills | Status: DC

## 2021-03-23 MED ORDER — ACETAMINOPHEN 500 MG PO TABS
1000.0000 mg | ORAL_TABLET | Freq: Once | ORAL | Status: AC
  Administered 2021-03-23: 1000 mg via ORAL
  Filled 2021-03-23: qty 2

## 2021-03-23 NOTE — Discharge Instructions (Signed)

## 2021-03-23 NOTE — MAU Provider Note (Signed)
History     CSN: [id number]  Arrival date and time: 03/23/21 8469   Event Date/Time   First Provider Initiated Contact with Patient 03/23/21 1038      Chief Complaint  Patient presents with   Cough   Sore Throat   HPI Sierra Evans is a 31 y.o. G4P3003 at [redacted]w[redacted]d who presents to MAU with chief complaint of intermittently productive cough and sore throat. These are recurrent problems, onset three weeks ago. Symptoms have waxed and waned since onset and are now accompanied by chest tightness. Tightness resolves once coughing subsides. Patient is managing her cough with Mucinex. She has not used her Albulterol inhaler in about one week because she did not feel it would be effective. She has not taken other medication or tried other treatments for these complaints. She denies fever, activity intolerance, weakness, syncope. She denies pregnancy-related concerns including vaginal bleeding, abnormal discharge, dysuria.   Patient receives care with [facility].  OB History     Gravida  4   Para  3   Term  3   Preterm      AB  0   Living  3      SAB      IAB      Ectopic  0   Multiple      Live Births  3           Past Medical History:  Diagnosis Date   Asthma    Headache    Scoliosis    UTI (urinary tract infection)     Past Surgical History:  Procedure Laterality Date   cervix scrape     ? D&C "for bleeding"   CESAREAN SECTION      Family History  Problem Relation Age of Onset   Alcohol abuse Mother    Cirrhosis Mother    COPD Father    Cancer Brother    Cancer Maternal Grandfather    Stroke Maternal Grandfather    Hypertension Maternal Grandfather    Cancer Paternal Grandmother     Social History   Tobacco Use   Smoking status: Never   Smokeless tobacco: Never  Vaping Use   Vaping Use: Never used  Substance Use Topics   Alcohol use: No   Drug use: No    Allergies:  Allergies  Allergen Reactions   Codeine Hives   Hydrocodone Nausea  And Vomiting   Strawberry Extract Hives    Medications Prior to Admission  Medication Sig Dispense Refill Last Dose   albuterol (VENTOLIN HFA) 108 (90 Base) MCG/ACT inhaler Inhale 1-2 puffs into the lungs every 6 (six) hours as needed for wheezing or shortness of breath. 1 each 0 Past Week   Blood Pressure Monitoring (BLOOD PRESSURE KIT) DEVI 1 kit by Does not apply route once a week. 1 each 0 Past Week   Prenatal MV & Min w/FA-DHA (PRENATAL GUMMIES PO) Take by mouth.   03/22/2021   aspirin EC 81 MG tablet Take 1 tablet (81 mg total) by mouth daily. Swallow whole. (Patient not taking: Reported on 03/15/2021) 30 tablet 3 Unknown   bisacodyl (DULCOLAX) 10 MG suppository Place 1 suppository (10 mg total) rectally as needed for moderate constipation. (Patient not taking: Reported on 12/24/2020) 12 suppository 0 Unknown   Doxylamine-Pyridoxine (DICLEGIS) 10-10 MG TBEC Take 2 tablets by mouth at bedtime. If symptoms persist, add one tablet in the morning and one in the afternoon (Patient not taking: Reported on 03/15/2021) 100 tablet 5 Unknown  polyethylene glycol (MIRALAX / GLYCOLAX) 17 g packet Take 17 g by mouth daily. (Patient not taking: Reported on 12/24/2020) 14 each 0 Unknown   Prenatal Vit-Fe Fumarate-FA (MULTIVITAMIN-PRENATAL) 27-0.8 MG TABS tablet Take 1 tablet by mouth daily at 12 noon.   Unknown   promethazine (PHENERGAN) 25 MG tablet Take 1 tablet (25 mg total) by mouth every 6 (six) hours as needed for nausea or vomiting. (Patient not taking: Reported on 03/15/2021) 30 tablet 2 Unknown    Review of Systems  HENT:  Positive for congestion and sore throat.   Respiratory:  Positive for cough and shortness of breath.   All other systems reviewed and are negative. Physical Exam   Blood pressure (!) 128/58, pulse 90, temperature 98.4 F (36.9 C), temperature source Oral, resp. rate 18, height _0  (1.575 m), weight 95.4 kg, last menstrual period 10/23/2020, SpO2 98 %.  Physical Exam Vitals  and nursing note reviewed.  Constitutional:      Appearance: She is well-developed. She is ill-appearing.  HENT:     Mouth/Throat:     Mouth: Mucous membranes are moist. No oral lesions.     Pharynx: Uvula midline. No pharyngeal swelling, oropharyngeal exudate, posterior oropharyngeal erythema or uvula swelling.     Tonsils: No tonsillar exudate.  Eyes:     Conjunctiva/sclera: Conjunctivae normal.  Cardiovascular:     Rate and Rhythm: Normal rate and regular rhythm.     Heart sounds: Normal heart sounds.  Pulmonary:     Effort: Pulmonary effort is normal.     Breath sounds: Normal breath sounds.  Abdominal:     Palpations: Abdomen is soft.  Skin:    Capillary Refill: Capillary refill takes less than 2 seconds.  Neurological:     Mental Status: She is alert and oriented to person, place, and time.  Psychiatric:        Mood and Affect: Mood normal.        Behavior: Behavior normal.    MAU Course  Procedures  MDM - Pertinent negatives: abnormal lung sounds, increased WOB, fever, abnormal VS, elevated WBCs - Will collect chest xray given recurrent cough SOB, pending COVID swab - Encouraged patient to use inhaler with onset of SOB  Patient Vitals for the past 24 hrs:  BP Temp Temp src Pulse Resp SpO2 Height Weight  03/23/21 1115 118/66 -- -- 88 17 98 % -- --  03/23/21 0803 (!) 128/58 98.4 F (36.9 C) Oral 90 18 98 % -- --  03/23/21 0739 (!) 130/58 98.1 F (36.7 C) Oral 90 18 98 % -- --  03/23/21 0732 -- -- -- -- -- -- _1  (1.575 m) 95.4 kg   Results for orders placed or performed during the hospital encounter of 03/23/21 (from the past 24 hour(s))  CBC with Differential/Platelet     Status: Abnormal   Collection Time: 03/23/21  8:23 AM  Result Value Ref Range   WBC 8.5 4.0 - 10.5 K/uL   RBC 3.49 (L) 3.87 - 5.11 MIL/uL   Hemoglobin 10.5 (L) 12.0 - 15.0 g/dL   HCT 31.3 (L) 36.0 - 46.0 %   MCV 89.7 80.0 - 100.0 fL   MCH 30.1 26.0 - 34.0 pg   MCHC 33.5 30.0 - 36.0 g/dL    RDW 13.2 11.5 - 15.5 %   Platelets 333 150 - 400 K/uL   nRBC 0.0 0.0 - 0.2 %   Neutrophils Relative % 67 %   Neutro Abs 5.6 1.7 - 7.7  K/uL   Lymphocytes Relative 22 %   Lymphs Abs 1.9 0.7 - 4.0 K/uL   Monocytes Relative 7 %   Monocytes Absolute 0.6 0.1 - 1.0 K/uL   Eosinophils Relative 3 %   Eosinophils Absolute 0.3 0.0 - 0.5 K/uL   Basophils Relative 0 %   Basophils Absolute 0.0 0.0 - 0.1 K/uL   Immature Granulocytes 1 %   Abs Immature Granulocytes 0.12 (H) 0.00 - 0.07 K/uL  Resp Panel by RT-PCR (Flu A&B, Covid) Nasopharyngeal Swab     Status: None   Collection Time: 03/23/21  8:26 AM   Specimen: Nasopharyngeal Swab; Nasopharyngeal(NP) swabs in vial transport medium  Result Value Ref Range   SARS Coronavirus 2 by RT PCR NEGATIVE NEGATIVE   Influenza A by PCR NEGATIVE NEGATIVE   Influenza B by PCR NEGATIVE NEGATIVE   DG Chest Port 1 View  Result Date: 03/23/2021 CLINICAL DATA:  Cough over the last 3 weeks. Chest tightness. Shortness of breath. EXAM: PORTABLE CHEST 1 VIEW COMPARISON:  None. FINDINGS: Heart and mediastinal shadows are normal. The right lung is clear. Question minimal patchy density in the lingula. This is not certain. No evidence of pleural effusion or pulmonary edema. No abnormal bone finding. IMPRESSION: Question mild atelectasis or infiltrate in the lingula. No advanced finding. Electronically Signed   By: Nelson Chimes M.D.   On: 03/23/2021 08:48    Meds ordered this encounter  Medications   guaiFENesin (ROBITUSSIN) 100 MG/5ML liquid 15 mL   acetaminophen (TYLENOL) tablet 1,000 mg   benzonatate (TESSALON) 100 MG capsule    Sig: Take 1 capsule (100 mg total) by mouth every 8 (eight) hours.    Dispense:  21 capsule    Refill:  0    Order Specific Question:   Supervising Provider    Answer:   Aletha Halim [[id number]]   Assessment and Plan  --31 y.o. G4P3003 at [redacted]w[redacted]d --[id number] by Doppler --Non-productive cough with possible seasonal allergy  component --Grossly normal Chest XRAY --VSS throughout MAU encounter --Lungs CTAB --Negative Flu and COVID --Discussed OTC safe medications in pregnancy for ongoing symptom management --Reviewed indications of worsening acuity which would indicate return to MAU --Discharge home in stable condition  [place]arlina Rumpf [place] MSN, CNM 03/23/2021, 1:46 PM

## 2021-03-23 NOTE — MAU Note (Signed)
Sierra Evans is a 31 y.o. at [redacted]w[redacted]d here in MAU reporting: has had a cough for 3 weeks. States it is a "hard and rough" cough. Having to sit up in a chair to sleep. Coughing up clear and green stuff. Increased fatigue. Has been taking mucinex every 6-12 hours. Neg home covid test but has not done flu or strep. Chest feels tight when she coughs. ? ?Onset of complaint: ongoing ? ?Pain score: 9/10 ? ?Vitals:  ? 03/23/21 0739  ?BP: (!) 130/58  ?Pulse: 90  ?Resp: 18  ?Temp: 98.1 ?F (36.7 ?C)  ?SpO2: 98%  ?   ?FHT:150 ? ?Lab orders placed from triage: none ? ?

## 2021-03-24 ENCOUNTER — Other Ambulatory Visit: Payer: Self-pay | Admitting: *Deleted

## 2021-03-24 DIAGNOSIS — J4520 Mild intermittent asthma, uncomplicated: Secondary | ICD-10-CM

## 2021-03-24 MED ORDER — ALBUTEROL SULFATE HFA 108 (90 BASE) MCG/ACT IN AERS: 1.0000 | INHALATION_SPRAY | Freq: Four times a day (QID) | RESPIRATORY_TRACT | 0 refills | Status: DC | PRN

## 2021-03-24 NOTE — Progress Notes (Signed)
Urgent need for inhaler due to asthma exacerbation from URI. RX x 1 sent. Pt advised to follow up with PCP for further refills and asthma management. ?

## 2021-04-01 ENCOUNTER — Encounter: Payer: Self-pay | Admitting: Obstetrics & Gynecology

## 2021-04-05 ENCOUNTER — Other Ambulatory Visit: Payer: Self-pay

## 2021-04-05 ENCOUNTER — Ambulatory Visit: Payer: BC Managed Care – PPO | Attending: Obstetrics

## 2021-04-05 ENCOUNTER — Encounter: Payer: Self-pay | Admitting: *Deleted

## 2021-04-05 ENCOUNTER — Ambulatory Visit: Payer: BC Managed Care – PPO | Admitting: *Deleted

## 2021-04-05 ENCOUNTER — Other Ambulatory Visit: Payer: Self-pay | Admitting: *Deleted

## 2021-04-05 VITALS — BP 119/63 | HR 71

## 2021-04-05 DIAGNOSIS — O34219 Maternal care for unspecified type scar from previous cesarean delivery: Secondary | ICD-10-CM | POA: Insufficient documentation

## 2021-04-05 DIAGNOSIS — Z3A23 23 weeks gestation of pregnancy: Secondary | ICD-10-CM | POA: Diagnosis not present

## 2021-04-05 DIAGNOSIS — Z363 Encounter for antenatal screening for malformations: Secondary | ICD-10-CM | POA: Diagnosis not present

## 2021-04-05 DIAGNOSIS — Z3482 Encounter for supervision of other normal pregnancy, second trimester: Secondary | ICD-10-CM | POA: Insufficient documentation

## 2021-04-05 DIAGNOSIS — O99212 Obesity complicating pregnancy, second trimester: Secondary | ICD-10-CM | POA: Diagnosis not present

## 2021-04-05 DIAGNOSIS — Z6841 Body Mass Index (BMI) 40.0 and over, adult: Secondary | ICD-10-CM

## 2021-04-12 ENCOUNTER — Other Ambulatory Visit: Payer: Self-pay

## 2021-04-12 ENCOUNTER — Ambulatory Visit (INDEPENDENT_AMBULATORY_CARE_PROVIDER_SITE_OTHER): Payer: BC Managed Care – PPO | Admitting: Obstetrics & Gynecology

## 2021-04-12 VITALS — BP 106/69 | HR 74 | Wt 215.0 lb

## 2021-04-12 DIAGNOSIS — Z3A24 24 weeks gestation of pregnancy: Secondary | ICD-10-CM

## 2021-04-12 DIAGNOSIS — Z98891 History of uterine scar from previous surgery: Secondary | ICD-10-CM

## 2021-04-12 DIAGNOSIS — Z3482 Encounter for supervision of other normal pregnancy, second trimester: Secondary | ICD-10-CM

## 2021-04-12 NOTE — Progress Notes (Signed)
? ?  PRENATAL VISIT NOTE ? ?Subjective:  ?Sierra Evans is a 31 y.o. [phone] at [redacted]w[redacted]d being seen today for ongoing prenatal care.  She is currently monitored for the following issues for this high-risk pregnancy and has Supervision of normal pregnancy and History of C-section on their problem list. ? ?Patient reports no complaints.  Contractions: Irritability. Vag. Bleeding: None.  Movement: Present. Denies leaking of fluid.  ? ?The following portions of the patient's history were reviewed and updated as appropriate: allergies, current medications, past family history, past medical history, past social history, past surgical history and problem list.  ? ?Objective:  ? ?Vitals:  ? 04/12/21 0846  ?BP: 106/69  ?Pulse: 74  ?Weight: 215 lb (97.5 kg)  ? ? ?Fetal Status: Fetal Heart Rate (bpm): 151   Movement: Present    ? ?General:  Alert, oriented and cooperative. Patient is in no acute distress.  ?Skin: Skin is warm and dry. No rash noted.   ?Cardiovascular: Normal heart rate noted  ?Respiratory: Normal respiratory effort, no problems with respiration noted  ?Abdomen: Soft, gravid, appropriate for gestational age.  Pain/Pressure: Present     ?Pelvic: Cervical exam deferred        ?Extremities: Normal range of motion.  Edema: Trace  ?Mental Status: Normal mood and affect. Normal behavior. Normal judgment and thought content.  ? ?Assessment and Plan:  ?Pregnancy: [id number] at [redacted]w[redacted]d ?1. Encounter for supervision of other normal pregnancy in second trimester ?F/u growth 32 weeks ?Body mass index is 39.32 kg/m?. ? ? ?2. History of C-section ?Plans TOLAC, counseled and signed consent ? ?Preterm labor symptoms and general obstetric precautions including but not limited to vaginal bleeding, contractions, leaking of fluid and fetal movement were reviewed in detail with the patient. ?Please refer to After Visit Summary for other counseling recommendations.  ? ?Return in about 4 weeks (around 05/10/2021). ? ?Future Appointments   ?Date Time Provider [place]  ?06/07/2021  9:30 AM [facility] NURSE [facility]  ?06/07/2021  9:45 AM [facility] US5 [facility]  ? ? ?Emeterio Reeve, MD ?

## 2021-04-26 ENCOUNTER — Telehealth: Payer: Self-pay | Admitting: Emergency Medicine

## 2021-04-26 ENCOUNTER — Encounter: Payer: Self-pay | Admitting: Emergency Medicine

## 2021-04-26 NOTE — Telephone Encounter (Signed)
TC to patient to discuss FMLA paperwork.  ?Pt states that employer is requestingmore  supporting documentation for frequent bathroom breaks and [facility] requests.  ? ?Request to extend time to work on paperwork faxed to employer with confirmation.  ? ?More information is needed with provider's approval.  ?Patient verbally agreed that the office is still working on her request and is continuing to keep her updated. ?

## 2021-04-27 ENCOUNTER — Other Ambulatory Visit: Payer: Self-pay | Admitting: Obstetrics

## 2021-04-27 DIAGNOSIS — J4520 Mild intermittent asthma, uncomplicated: Secondary | ICD-10-CM

## 2021-04-29 ENCOUNTER — Telehealth: Payer: Self-pay | Admitting: Emergency Medicine

## 2021-04-29 NOTE — Telephone Encounter (Signed)
Attempted TC to pt-no answer. ?Pt should discuss employer accommodations with provider at 4/25 visit.  ? ?

## 2021-04-30 ENCOUNTER — Other Ambulatory Visit: Payer: Self-pay

## 2021-04-30 ENCOUNTER — Encounter (HOSPITAL_COMMUNITY): Payer: Self-pay | Admitting: Obstetrics & Gynecology

## 2021-04-30 ENCOUNTER — Inpatient Hospital Stay (HOSPITAL_COMMUNITY)
Admission: AD | Admit: 2021-04-30 | Discharge: 2021-04-30 | Disposition: A | Discharge status: Home / Self Care | Payer: BC Managed Care – PPO | Attending: Obstetrics & Gynecology | Admitting: Obstetrics & Gynecology

## 2021-04-30 DIAGNOSIS — Z0371 Encounter for suspected problem with amniotic cavity and membrane ruled out: Secondary | ICD-10-CM | POA: Insufficient documentation

## 2021-04-30 DIAGNOSIS — B3731 Acute candidiasis of vulva and vagina: Secondary | ICD-10-CM

## 2021-04-30 DIAGNOSIS — Z3A27 27 weeks gestation of pregnancy: Secondary | ICD-10-CM | POA: Insufficient documentation

## 2021-04-30 DIAGNOSIS — O98812 Other maternal infectious and parasitic diseases complicating pregnancy, second trimester: Secondary | ICD-10-CM | POA: Diagnosis not present

## 2021-04-30 DIAGNOSIS — O23592 Infection of other part of genital tract in pregnancy, second trimester: Secondary | ICD-10-CM | POA: Diagnosis not present

## 2021-04-30 DIAGNOSIS — O98819 Other maternal infectious and parasitic diseases complicating pregnancy, unspecified trimester: Secondary | ICD-10-CM | POA: Diagnosis not present

## 2021-04-30 LAB — URINALYSIS, ROUTINE W REFLEX MICROSCOPIC
Bilirubin Urine: NEGATIVE
Glucose, UA: 150 mg/dL — AB
Hgb urine dipstick: NEGATIVE
Ketones, ur: NEGATIVE mg/dL
Nitrite: NEGATIVE
Protein, ur: 30 mg/dL — AB
Specific Gravity, Urine: 1.026 (ref 1.005–1.030)
pH: 6 (ref 5.0–8.0)

## 2021-04-30 LAB — WET PREP, GENITAL
Clue Cells Wet Prep HPF POC: NONE SEEN
Sperm: NONE SEEN
Trich, Wet Prep: NONE SEEN
WBC, Wet Prep HPF POC: >=10 — AB (ref <10)

## 2021-04-30 LAB — AMNISURE RUPTURE OF MEMBRANE (ROM) NOT AT ARMC: Amnisure ROM: NEGATIVE

## 2021-04-30 LAB — POCT FERN TEST: POCT Fern Test: NEGATIVE

## 2021-04-30 MED ORDER — TERCONAZOLE 0.4 % VA CREA: 1.0000 | TOPICAL_CREAM | Freq: Every day | VAGINAL | 0 refills | Status: DC

## 2021-04-30 NOTE — MAU Provider Note (Signed)
?History  ?  ? ?CSN: [id number] ? ?Arrival date and time: 04/30/21 1727 ? ? Event Date/Time  ? First Provider Initiated Contact with Patient 04/30/21 1812   ?  ? ?Chief Complaint  ?Patient presents with  ? Rupture of Membranes  ? Vaginal Discharge  ? ?HPI ?Sierra Evans is a 31 y.o. [phone] at 76w0dwho presents with vaginal discharge. She reports she had one gush of liquid and has continued to have wet underwear throughout the day. She denies any abdominal pain or bleeding. She reports normal fetal movement.  ? ?OB History   ? ? Gravida  ?4  ? Para  ?3  ? Term  ?3  ? Preterm  ?   ? AB  ?0  ? Living  ?3  ?  ? ? SAB  ?   ? IAB  ?   ? Ectopic  ?0  ? Multiple  ?   ? Live Births  ?3  ?   ?  ?  ? ? ?Past Medical History:  ?Diagnosis Date  ? Asthma   ? Headache   ? Scoliosis   ? UTI (urinary tract infection)   ? ? ?Past Surgical History:  ?Procedure Laterality Date  ? cervix scrape    ? ? D&C "for bleeding"  ? CESAREAN SECTION    ? ? ?Family History  ?Problem Relation Age of Onset  ? Alcohol abuse Mother   ? Cirrhosis Mother   ? COPD Father   ? Cancer Brother   ? Cancer Maternal Grandfather   ? Stroke Maternal Grandfather   ? Hypertension Maternal Grandfather   ? Cancer Paternal Grandmother   ? ? ?Social History  ? ?Tobacco Use  ? Smoking status: Never  ? Smokeless tobacco: Never  ?Vaping Use  ? Vaping Use: Never used  ?Substance Use Topics  ? Alcohol use: No  ? Drug use: No  ? ? ?Allergies:  ?Allergies  ?Allergen Reactions  ? Codeine Hives  ? Hydrocodone Nausea And Vomiting  ? Strawberry Extract Hives  ? ? ?Medications Prior to Admission  ?Medication Sig Dispense Refill Last Dose  ? aspirin EC 81 MG tablet Take 1 tablet (81 mg total) by mouth daily. Swallow whole. 30 tablet 3 04/29/2021  ? Prenatal MV & Min w/FA-DHA (PRENATAL GUMMIES PO) Take by mouth.   04/29/2021  ? benzonatate (TESSALON) 100 MG capsule Take 1 capsule (100 mg total) by mouth every 8 (eight) hours. 21 capsule 0   ? bisacodyl (DULCOLAX) 10 MG  suppository Place 1 suppository (10 mg total) rectally as needed for moderate constipation. (Patient not taking: Reported on 12/24/2020) 12 suppository 0   ? Blood Pressure Monitoring (BLOOD PRESSURE KIT) DEVI 1 kit by Does not apply route once a week. 1 each 0   ? Doxylamine-Pyridoxine (DICLEGIS) 10-10 MG TBEC Take 2 tablets by mouth at bedtime. If symptoms persist, add one tablet in the morning and one in the afternoon (Patient not taking: Reported on 03/15/2021) 100 tablet 5   ? polyethylene glycol (MIRALAX / GLYCOLAX) 17 g packet Take 17 g by mouth daily. (Patient not taking: Reported on 12/24/2020) 14 each 0   ? Prenatal Vit-Fe Fumarate-FA (MULTIVITAMIN-PRENATAL) 27-0.8 MG TABS tablet Take 1 tablet by mouth daily at 12 noon.     ? promethazine (PHENERGAN) 25 MG tablet Take 1 tablet (25 mg total) by mouth every 6 (six) hours as needed for nausea or vomiting. (Patient not taking: Reported on 03/15/2021) 30 tablet 2   ?  VENTOLIN HFA 108 (90 Base) MCG/ACT inhaler INHALE 1-2 PUFFS BY MOUTH EVERY 6 HOURS AS NEEDED FOR WHEEZE OR SHORTNESS OF BREATH 18 each 5   ? ? ?Review of Systems  ?Constitutional: Negative.  Negative for fatigue and fever.  ?HENT: Negative.    ?Respiratory: Negative.  Negative for shortness of breath.   ?Cardiovascular: Negative.  Negative for chest pain.  ?Gastrointestinal: Negative.  Negative for abdominal pain, constipation, diarrhea, nausea and vomiting.  ?Genitourinary:  Positive for vaginal discharge. Negative for dysuria and vaginal bleeding.  ?Neurological: Negative.  Negative for dizziness and headaches.  ?Physical Exam  ? ?Blood pressure (!) 113/59, pulse 94, temperature 98.4 ?F (36.9 ?C), temperature source Oral, resp. rate 17, last menstrual period 10/23/2020, SpO2 100 %. ? ?Physical Exam ?Vitals and nursing note reviewed.  ?Constitutional:   ?   General: She is not in acute distress. ?   Appearance: She is well-developed.  ?HENT:  ?   Head: Normocephalic.  ?Eyes:  ?   Pupils: Pupils are  equal, round, and reactive to light.  ?Cardiovascular:  ?   Rate and Rhythm: Normal rate and regular rhythm.  ?   Heart sounds: Normal heart sounds.  ?Pulmonary:  ?   Effort: Pulmonary effort is normal. No respiratory distress.  ?   Breath sounds: Normal breath sounds.  ?Abdominal:  ?   General: Bowel sounds are normal. There is no distension.  ?   Palpations: Abdomen is soft.  ?   Tenderness: There is no abdominal tenderness.  ?Genitourinary: ?   Comments: Pelvic exam: Cervix pink, visually closed, without lesion, scant white creamy discharge, vaginal walls and external genitalia normal ?Bimanual exam: Cervix 0/long/high ? ?Skin: ?   General: Skin is warm and dry.  ?Neurological:  ?   Mental Status: She is alert and oriented to person, place, and time.  ?Psychiatric:     ?   Mood and Affect: Mood normal.     ?   Behavior: Behavior normal.     ?   Thought Content: Thought content normal.     ?   Judgment: Judgment normal.  ? ?Fetal Tracing: ? ?Baseline: 150 ?Variability: moderate ?Accels: 15x15 ?Decels: none ? ?Toco: UI ? ? ?MAU Course  ?Procedures ?Results for orders placed or performed during the hospital encounter of 04/30/21 (from the past 24 hour(s))  ?Urinalysis, Routine w reflex microscopic Urine, Clean Catch     Status: Abnormal  ? Collection Time: 04/30/21  6:05 PM  ?Result Value Ref Range  ? Color, Urine AMBER (A) YELLOW  ? APPearance CLOUDY (A) CLEAR  ? Specific Gravity, Urine 1.026 1.005 - 1.030  ? pH 6.0 5.0 - 8.0  ? Glucose, UA 150 (A) NEGATIVE mg/dL  ? Hgb urine dipstick NEGATIVE NEGATIVE  ? Bilirubin Urine NEGATIVE NEGATIVE  ? Ketones, ur NEGATIVE NEGATIVE mg/dL  ? Protein, ur 30 (A) NEGATIVE mg/dL  ? Nitrite NEGATIVE NEGATIVE  ? Leukocytes,Ua MODERATE (A) NEGATIVE  ? RBC / HPF 0-5 0 - 5 RBC/hpf  ? WBC, UA 21-50 0 - 5 WBC/hpf  ? Bacteria, UA FEW (A) NONE SEEN  ? Squamous Epithelial / LPF 21-50 0 - 5  ? Mucus PRESENT   ? Budding Yeast PRESENT   ?Wet prep, genital     Status: Abnormal  ? Collection  Time: 04/30/21  6:21 PM  ?Result Value Ref Range  ? Yeast Wet Prep HPF POC PRESENT (A) NONE SEEN  ? Trich, Wet Prep NONE SEEN NONE SEEN  ?  Clue Cells Wet Prep HPF POC NONE SEEN NONE SEEN  ? WBC, Wet Prep HPF POC >=10 (A) <10  ? Sperm NONE SEEN   ?Amnisure rupture of membrane (rom)not at [facility]     Status: None  ? Collection Time: 04/30/21  6:21 PM  ?Result Value Ref Range  ? Amnisure ROM NEGATIVE   ?Fern Test     Status: Normal  ? Collection Time: 04/30/21  6:32 PM  ?Result Value Ref Range  ? POCT Fern Test Negative = intact amniotic membranes   ?  ?MDM ?UA, UC ?Wet prep and gc/chlamydia ?Amnisure ? ?Assessment and Plan  ? ?1. Candidiasis of vagina during pregnancy   ?2. [redacted] weeks gestation of pregnancy   ?3. Encounter for suspected premature rupture of amniotic membranes, with rupture of membranes not found   ? ?-Discharge home in stable condition ?-Rx for terazol sent to patient's pharmacy ?-Third trimester precautions discussed ?-Patient advised to follow-up with OB as scheduled for prenatal care ?-Patient may return to MAU as needed or if her condition were to change or worsen ? ? ?Wende Mott CNM ?04/30/2021, 6:12 PM  ?

## 2021-04-30 NOTE — MAU Note (Signed)
Pt reports to mau with c/o gush of fluid about an hour ago.  Pt reports her underwear have continued to stay wet since then. Reports some pelvic pressure but denies pain or reg ctx.   ?

## 2021-04-30 NOTE — Discharge Instructions (Signed)

## 2021-05-02 LAB — CULTURE, OB URINE

## 2021-05-02 LAB — GC/CHLAMYDIA PROBE AMP (~~LOC~~) NOT AT ARMC
Chlamydia: NEGATIVE
Comment: NEGATIVE
Comment: NORMAL
Neisseria Gonorrhea: NEGATIVE

## 2021-05-10 ENCOUNTER — Encounter: Payer: BC Managed Care – PPO | Admitting: Obstetrics and Gynecology

## 2021-05-10 ENCOUNTER — Other Ambulatory Visit: Payer: BC Managed Care – PPO

## 2021-05-10 DIAGNOSIS — O99210 Obesity complicating pregnancy, unspecified trimester: Secondary | ICD-10-CM | POA: Insufficient documentation

## 2021-05-12 ENCOUNTER — Ambulatory Visit (INDEPENDENT_AMBULATORY_CARE_PROVIDER_SITE_OTHER): Payer: BC Managed Care – PPO | Admitting: Obstetrics and Gynecology

## 2021-05-12 ENCOUNTER — Encounter: Payer: Self-pay | Admitting: Obstetrics and Gynecology

## 2021-05-12 VITALS — BP 116/73 | HR 81 | Wt 207.0 lb

## 2021-05-12 DIAGNOSIS — Z98891 History of uterine scar from previous surgery: Secondary | ICD-10-CM

## 2021-05-12 DIAGNOSIS — Z3483 Encounter for supervision of other normal pregnancy, third trimester: Secondary | ICD-10-CM

## 2021-05-12 DIAGNOSIS — O99210 Obesity complicating pregnancy, unspecified trimester: Secondary | ICD-10-CM

## 2021-05-12 NOTE — Progress Notes (Addendum)
? ?  PRENATAL VISIT NOTE ? ?Subjective:  ?Sierra Evans is a 31 y.o. [phone] at [redacted]w[redacted]d being seen today for ongoing prenatal care.  She is currently monitored for the following issues for this high-risk pregnancy and has Supervision of normal pregnancy; History of C-section; and Maternal obesity affecting pregnancy, antepartum on their problem list. ? ?Patient reports no complaints.  Contractions: Not present. Vag. Bleeding: None.  Movement: Present. Denies leaking of fluid.  ? ?The following portions of the patient's history were reviewed and updated as appropriate: allergies, current medications, past family history, past medical history, past social history, past surgical history and problem list.  ? ?Objective:  ? ?Vitals:  ? 05/12/21 1030  ?BP: 116/73  ?Pulse: 81  ?Weight: 207 lb (93.9 kg)  ? ? ?Fetal Status: Fetal Heart Rate (bpm): 150 Fundal Height: 29 cm Movement: Present    ? ?General:  Alert, oriented and cooperative. Patient is in no acute distress.  ?Skin: Skin is warm and dry. No rash noted.   ?Cardiovascular: Normal heart rate noted  ?Respiratory: Normal respiratory effort, no problems with respiration noted  ?Abdomen: Soft, gravid, appropriate for gestational age.  Pain/Pressure: Present     ?Pelvic: Cervical exam deferred        ?Extremities: Normal range of motion.     ?Mental Status: Normal mood and affect. Normal behavior. Normal judgment and thought content.  ? ?Assessment and Plan:  ?Pregnancy: [id number] at [redacted]w[redacted]d ?1. Encounter for supervision of other normal pregnancy in third trimester ?Patient is doing well without complaints ?Third trimester labs before her next appointment ?Work accomodation provided for frequent bathroom breaks due to gravid uterus putting pressure on bladder, 4 days per month for appointments or sick days and frequency of her appointments being twice weekly until 36 weeks when they become weekly ? ?2. History of C-section ?Desires TOLAC- consent previously signed ? ?3.  Maternal obesity affecting pregnancy, antepartum ?Continue ASA ?Follow up growth ultrasound ? ?Preterm labor symptoms and general obstetric precautions including but not limited to vaginal bleeding, contractions, leaking of fluid and fetal movement were reviewed in detail with the patient. ?Please refer to After Visit Summary for other counseling recommendations.  ? ?No follow-ups on file. ? ?Future Appointments  ?Date Time Provider [place]  ?05/17/2021  9:00 AM [facility] LAB [facility] None  ?06/07/2021  9:30 AM [facility] NURSE [facility]  ?06/07/2021  9:45 AM [facility] US5 [facility]  ?08/11/2021  1:50 PM Ladell Pier, MD [facility] None  ? ? ?Mora Bellman, MD ? ?

## 2021-05-17 ENCOUNTER — Other Ambulatory Visit: Payer: BC Managed Care – PPO

## 2021-05-17 DIAGNOSIS — Z3483 Encounter for supervision of other normal pregnancy, third trimester: Secondary | ICD-10-CM

## 2021-05-18 ENCOUNTER — Encounter: Payer: BC Managed Care – PPO | Admitting: Medical

## 2021-05-18 LAB — CBC
Hematocrit: 29.2 % — ABNORMAL LOW (ref 34.0–46.6)
Hemoglobin: 9.9 g/dL — ABNORMAL LOW (ref 11.1–15.9)
MCH: 28.8 pg (ref 26.6–33.0)
MCHC: 33.9 g/dL (ref 31.5–35.7)
MCV: 85 fL (ref 79–97)
Platelets: 256 10*3/uL (ref 150–450)
RBC: 3.44 x10E6/uL — ABNORMAL LOW (ref 3.77–5.28)
RDW: 13.3 % (ref 11.7–15.4)
WBC: 7.5 10*3/uL (ref 3.4–10.8)

## 2021-05-18 LAB — GLUCOSE TOLERANCE, 2 HOURS W/ 1HR
Glucose, 1 hour: 127 mg/dL (ref 70–179)
Glucose, 2 hour: 135 mg/dL (ref 70–152)
Glucose, Fasting: 95 mg/dL — ABNORMAL HIGH (ref 70–91)

## 2021-05-18 LAB — RPR: RPR Ser Ql: NONREACTIVE

## 2021-05-18 LAB — HIV ANTIBODY (ROUTINE TESTING W REFLEX): HIV Screen 4th Generation wRfx: NONREACTIVE

## 2021-05-19 ENCOUNTER — Other Ambulatory Visit: Payer: Self-pay | Admitting: *Deleted

## 2021-05-19 ENCOUNTER — Encounter: Payer: Self-pay | Admitting: Obstetrics & Gynecology

## 2021-05-19 DIAGNOSIS — O24419 Gestational diabetes mellitus in pregnancy, unspecified control: Secondary | ICD-10-CM

## 2021-05-19 MED ORDER — ACCU-CHEK SOFTCLIX LANCETS MISC: 6 refills | Status: DC

## 2021-05-19 MED ORDER — ACCU-CHEK GUIDE W/DEVICE KIT: PACK | 0 refills | Status: DC

## 2021-05-19 MED ORDER — ACCU-CHEK GUIDE VI STRP: ORAL_STRIP | 6 refills | Status: DC

## 2021-05-19 NOTE — Progress Notes (Signed)
Diabetic testing supplies and N&D consult ordered today.  ?See lab result ?

## 2021-05-23 DIAGNOSIS — M5489 Other dorsalgia: Secondary | ICD-10-CM | POA: Diagnosis not present

## 2021-05-24 ENCOUNTER — Telehealth: Payer: Self-pay

## 2021-05-24 ENCOUNTER — Other Ambulatory Visit: Payer: Self-pay

## 2021-05-24 DIAGNOSIS — Z3483 Encounter for supervision of other normal pregnancy, third trimester: Secondary | ICD-10-CM

## 2021-05-24 DIAGNOSIS — O99210 Obesity complicating pregnancy, unspecified trimester: Secondary | ICD-10-CM

## 2021-05-24 MED ORDER — MISC. DEVICES MISC: 0 refills | Status: DC

## 2021-05-24 NOTE — Telephone Encounter (Signed)
Written orders for Breast Pump and Compression socks signed and faxed to [facility] 05/24/2021 ?

## 2021-05-25 ENCOUNTER — Encounter: Payer: BC Managed Care – PPO | Attending: Obstetrics and Gynecology | Admitting: Registered"

## 2021-05-25 DIAGNOSIS — O24419 Gestational diabetes mellitus in pregnancy, unspecified control: Secondary | ICD-10-CM | POA: Insufficient documentation

## 2021-05-27 ENCOUNTER — Encounter: Payer: Self-pay | Admitting: Registered"

## 2021-05-27 NOTE — Progress Notes (Signed)
Patient was seen on 05/25/21 for Gestational Diabetes self-management class at the [facility]. The following learning objectives were met by the patient during this course: ? ?States the definition of Gestational Diabetes ?States why dietary management is important in controlling blood glucose ?Describes the effects each nutrient has on blood glucose levels ?Demonstrates ability to create a balanced meal plan ?Demonstrates carbohydrate counting  ?States when to check blood glucose levels ?Demonstrates proper blood glucose monitoring techniques ?States the effect of stress and exercise on blood glucose levels ?States the importance of limiting caffeine and abstaining from alcohol and smoking ? ?Blood glucose monitor given: none. Has meter prior to class and is checking ? ?Patient instructed to monitor glucose levels: ?FBS: 60 - <95; 1 hour: <140; 2 hour: <120 ? ?Patient received handouts: ?Nutrition Diabetes and Pregnancy, including carb counting list ? ?Patient will be seen for follow-up as needed. ?

## 2021-06-01 ENCOUNTER — Ambulatory Visit (INDEPENDENT_AMBULATORY_CARE_PROVIDER_SITE_OTHER): Payer: BC Managed Care – PPO | Admitting: Obstetrics & Gynecology

## 2021-06-01 VITALS — BP 105/69 | HR 92 | Wt 208.0 lb

## 2021-06-01 DIAGNOSIS — O99210 Obesity complicating pregnancy, unspecified trimester: Secondary | ICD-10-CM

## 2021-06-01 DIAGNOSIS — Z3483 Encounter for supervision of other normal pregnancy, third trimester: Secondary | ICD-10-CM

## 2021-06-01 DIAGNOSIS — O24415 Gestational diabetes mellitus in pregnancy, controlled by oral hypoglycemic drugs: Secondary | ICD-10-CM | POA: Insufficient documentation

## 2021-06-01 MED ORDER — METFORMIN HCL 500 MG PO TABS: 500.0000 mg | ORAL_TABLET | Freq: Every day | ORAL | 5 refills | Status: DC

## 2021-06-01 NOTE — Progress Notes (Signed)
Pt states her sugars have been elevated.   Pt needs updates to her FMLA forms, have been printed and can be amended.

## 2021-06-01 NOTE — Progress Notes (Signed)
   PRENATAL VISIT NOTE  Subjective:  Sierra Evans is a 31 y.o. [phone] at [redacted]w[redacted]d being seen today for ongoing prenatal care.  She is currently monitored for the following issues for this high-risk pregnancy and has Supervision of normal pregnancy; History of C-section; and Maternal obesity affecting pregnancy, antepartum on their problem list.  Patient reports no complaints.  Contractions: Irregular. Vag. Bleeding: None.  Movement: Present. Denies leaking of fluid.   The following portions of the patient's history were reviewed and updated as appropriate: allergies, current medications, past family history, past medical history, past social history, past surgical history and problem list.   Objective:   Vitals:   06/01/21 0948  BP: 105/69  Pulse: 92  Weight: 208 lb (94.3 kg)    Fetal Status:     Movement: Present     General:  Alert, oriented and cooperative. Patient is in no acute distress.  Skin: Skin is warm and dry. No rash noted.   Cardiovascular: Normal heart rate noted  Respiratory: Normal respiratory effort, no problems with respiration noted  Abdomen: Soft, gravid, appropriate for gestational age.  Pain/Pressure: Present     Pelvic: Cervical exam deferred        Extremities: Normal range of motion.     Mental Status: Normal mood and affect. Normal behavior. Normal judgment and thought content.   Assessment and Plan:  Pregnancy: G4P3003 at [redacted]w[redacted]d 1. Maternal obesity affecting pregnancy, antepartum FBS and PP 160-180 but she did not bring her recorded values, add Metformin and check in 1 week  2. Encounter for supervision of other normal pregnancy in third trimester F/u US next week, may need to add BPP   Preterm labor symptoms and general obstetric precautions including but not limited to vaginal bleeding, contractions, leaking of fluid and fetal movement were reviewed in detail with the patient. Please refer to After Visit Summary for other counseling  recommendations.   Return in about 1 week (around 06/08/2021).  Future Appointments  Date Time Provider [place]  06/07/2021  9:30 AM [facility] NURSE [facility]  06/07/2021  9:45 AM [facility] US[facility]  06/08/2021  8:35 AM Griffin Basil, MD [place] None  08/11/2021  1:50 PM Ladell Pier, MD [facility] None    Emeterio Reeve, MD

## 2021-06-07 ENCOUNTER — Other Ambulatory Visit: Payer: Self-pay | Admitting: *Deleted

## 2021-06-07 ENCOUNTER — Ambulatory Visit: Payer: BC Managed Care – PPO | Admitting: *Deleted

## 2021-06-07 ENCOUNTER — Encounter: Payer: Self-pay | Admitting: *Deleted

## 2021-06-07 ENCOUNTER — Ambulatory Visit: Payer: BC Managed Care – PPO | Attending: Obstetrics | Admitting: Obstetrics

## 2021-06-07 ENCOUNTER — Ambulatory Visit: Payer: BC Managed Care – PPO | Attending: Maternal & Fetal Medicine

## 2021-06-07 ENCOUNTER — Other Ambulatory Visit: Payer: Self-pay | Admitting: Maternal & Fetal Medicine

## 2021-06-07 VITALS — BP 99/53 | HR 82

## 2021-06-07 DIAGNOSIS — O99210 Obesity complicating pregnancy, unspecified trimester: Secondary | ICD-10-CM

## 2021-06-07 DIAGNOSIS — Z3483 Encounter for supervision of other normal pregnancy, third trimester: Secondary | ICD-10-CM

## 2021-06-07 DIAGNOSIS — Z6841 Body Mass Index (BMI) 40.0 and over, adult: Secondary | ICD-10-CM | POA: Insufficient documentation

## 2021-06-07 DIAGNOSIS — O24415 Gestational diabetes mellitus in pregnancy, controlled by oral hypoglycemic drugs: Secondary | ICD-10-CM | POA: Diagnosis not present

## 2021-06-07 DIAGNOSIS — O99213 Obesity complicating pregnancy, third trimester: Secondary | ICD-10-CM

## 2021-06-07 DIAGNOSIS — Z3A32 32 weeks gestation of pregnancy: Secondary | ICD-10-CM | POA: Diagnosis not present

## 2021-06-07 DIAGNOSIS — E669 Obesity, unspecified: Secondary | ICD-10-CM

## 2021-06-07 DIAGNOSIS — Z6838 Body mass index (BMI) 38.0-38.9, adult: Secondary | ICD-10-CM

## 2021-06-07 DIAGNOSIS — O34219 Maternal care for unspecified type scar from previous cesarean delivery: Secondary | ICD-10-CM | POA: Insufficient documentation

## 2021-06-07 NOTE — Progress Notes (Signed)
MFM Note  Sierra Evans was seen due to maternal obesity with a BMI of 46 and recently diagnosed gestational diabetes treated with metformin.  On today's exam, the overall EFW of 5 pounds 2 ounces measures at the 86 percentile.  There is normal amniotic fluid noted.  A BPP performed today was 8 out of 8.    The following were discussed during our consultation today:  Gestational diabetes treated with metformin  The implications and management of diabetes in pregnancy was discussed in detail with the patient.    She was advised to continue to monitor her fingersticks 4 times daily (fasting and 2 hours after each meal).    She was advised that our goals for her fingerstick values are fasting values of 90-95 or less and two-hour postprandial values of 120 or less.    Should the majority of her fingerstick results be above these values, her metformin dose may have to be increased or she may have to be switched to insulin to help her achieve better glycemic control.   The patient was advised that getting her fingerstick values as close to these goals as possible would provide her with the most optimal obstetrical outcome.  The increased risk of polyhydramnios, fetal macrosomia, and preeclampsia associated with diabetes was also discussed.    Due to gestational diabetes, we will continue to follow her with weekly BPP's until delivery.  The patient was advised that delivery for well-controlled diabetes in pregnancy is usually recommended at around 39 weeks.  Delivery at 37 weeks may be considered should her glycemic control be poor.  Obesity in pregnancy  As maternal obesity may present challenges associated with the management of anesthesia, an anesthesia consult should be obtained when she is admitted in labor.  The patient stated that all of her questions have been answered.  Another BPP was scheduled in 1 week.  A total of 20 minutes was spent counseling and coordinating the care  for this patient.  Greater than 50% of the time was spent in direct face-to-face contact.

## 2021-06-08 ENCOUNTER — Telehealth: Payer: Self-pay

## 2021-06-08 ENCOUNTER — Encounter: Payer: BC Managed Care – PPO | Admitting: Obstetrics and Gynecology

## 2021-06-14 ENCOUNTER — Encounter: Payer: Self-pay | Admitting: *Deleted

## 2021-06-14 ENCOUNTER — Ambulatory Visit: Payer: BC Managed Care – PPO | Attending: Obstetrics

## 2021-06-14 ENCOUNTER — Ambulatory Visit: Payer: BC Managed Care – PPO | Admitting: *Deleted

## 2021-06-14 VITALS — BP 108/61 | HR 92

## 2021-06-14 DIAGNOSIS — Z3A33 33 weeks gestation of pregnancy: Secondary | ICD-10-CM | POA: Diagnosis not present

## 2021-06-14 DIAGNOSIS — Z6838 Body mass index (BMI) 38.0-38.9, adult: Secondary | ICD-10-CM | POA: Insufficient documentation

## 2021-06-14 DIAGNOSIS — Z3483 Encounter for supervision of other normal pregnancy, third trimester: Secondary | ICD-10-CM | POA: Insufficient documentation

## 2021-06-14 DIAGNOSIS — O99210 Obesity complicating pregnancy, unspecified trimester: Secondary | ICD-10-CM | POA: Insufficient documentation

## 2021-06-14 DIAGNOSIS — R609 Edema, unspecified: Secondary | ICD-10-CM | POA: Diagnosis not present

## 2021-06-14 DIAGNOSIS — O24415 Gestational diabetes mellitus in pregnancy, controlled by oral hypoglycemic drugs: Secondary | ICD-10-CM

## 2021-06-14 DIAGNOSIS — O34219 Maternal care for unspecified type scar from previous cesarean delivery: Secondary | ICD-10-CM | POA: Diagnosis not present

## 2021-06-14 DIAGNOSIS — O99213 Obesity complicating pregnancy, third trimester: Secondary | ICD-10-CM | POA: Insufficient documentation

## 2021-06-14 DIAGNOSIS — E669 Obesity, unspecified: Secondary | ICD-10-CM

## 2021-06-22 ENCOUNTER — Ambulatory Visit: Payer: BC Managed Care – PPO

## 2021-06-22 ENCOUNTER — Inpatient Hospital Stay (HOSPITAL_COMMUNITY)
Admission: AD | Admit: 2021-06-22 | Discharge: 2021-06-22 | Disposition: A | Discharge status: Home / Self Care | Payer: BC Managed Care – PPO | Attending: Obstetrics and Gynecology | Admitting: Obstetrics and Gynecology

## 2021-06-22 ENCOUNTER — Encounter (HOSPITAL_COMMUNITY): Payer: Self-pay | Admitting: Obstetrics and Gynecology

## 2021-06-22 DIAGNOSIS — O99210 Obesity complicating pregnancy, unspecified trimester: Secondary | ICD-10-CM

## 2021-06-22 DIAGNOSIS — B3731 Acute candidiasis of vulva and vagina: Secondary | ICD-10-CM

## 2021-06-22 DIAGNOSIS — Z3A34 34 weeks gestation of pregnancy: Secondary | ICD-10-CM | POA: Insufficient documentation

## 2021-06-22 DIAGNOSIS — O4703 False labor before 37 completed weeks of gestation, third trimester: Secondary | ICD-10-CM | POA: Insufficient documentation

## 2021-06-22 DIAGNOSIS — Z3689 Encounter for other specified antenatal screening: Secondary | ICD-10-CM

## 2021-06-22 DIAGNOSIS — O26893 Other specified pregnancy related conditions, third trimester: Secondary | ICD-10-CM | POA: Diagnosis not present

## 2021-06-22 LAB — URINALYSIS, ROUTINE W REFLEX MICROSCOPIC
Bilirubin Urine: NEGATIVE
Glucose, UA: 150 mg/dL — AB
Hgb urine dipstick: NEGATIVE
Ketones, ur: 5 mg/dL — AB
Nitrite: NEGATIVE
Protein, ur: 30 mg/dL — AB
Specific Gravity, Urine: 1.028 (ref 1.005–1.030)
pH: 6 (ref 5.0–8.0)

## 2021-06-22 LAB — WET PREP, GENITAL
Clue Cells Wet Prep HPF POC: NONE SEEN
Sperm: NONE SEEN
Trich, Wet Prep: NEGATIVE — AB
WBC, Wet Prep HPF POC: <10 (ref <10)

## 2021-06-22 LAB — GROUP B STREP BY PCR: Group B strep by PCR: POSITIVE — AB

## 2021-06-22 MED ORDER — LACTATED RINGERS IV BOLUS
1000.0000 mL | Freq: Once | INTRAVENOUS | Status: AC
  Administered 2021-06-22: 1000 mL via INTRAVENOUS

## 2021-06-22 MED ORDER — FENTANYL CITRATE (PF) 100 MCG/2ML IJ SOLN
100.0000 ug | Freq: Once | INTRAMUSCULAR | Status: AC
  Administered 2021-06-22: 100 ug via INTRAVENOUS
  Filled 2021-06-22: qty 2

## 2021-06-22 MED ORDER — NIFEDIPINE 10 MG PO CAPS
10.0000 mg | ORAL_CAPSULE | ORAL | Status: DC | PRN
  Administered 2021-06-22: 10 mg via ORAL
  Filled 2021-06-22 (×2): qty 1

## 2021-06-22 MED ORDER — TERCONAZOLE 0.4 % VA CREA: 1.0000 | TOPICAL_CREAM | Freq: Every day | VAGINAL | 0 refills | Status: DC

## 2021-06-22 MED ORDER — TERBUTALINE SULFATE 1 MG/ML IJ SOLN
0.2500 mg | Freq: Once | INTRAMUSCULAR | Status: AC
  Administered 2021-06-22: 0.25 mg via SUBCUTANEOUS
  Filled 2021-06-22: qty 1

## 2021-06-22 MED ORDER — NIFEDIPINE 10 MG PO CAPS: 10.0000 mg | ORAL_CAPSULE | ORAL | 0 refills | Status: DC | PRN

## 2021-06-22 MED ORDER — OXYCODONE-ACETAMINOPHEN 5-325 MG PO TABS
2.0000 | ORAL_TABLET | Freq: Once | ORAL | Status: AC
  Administered 2021-06-22: 2 via ORAL
  Filled 2021-06-22: qty 2

## 2021-06-22 NOTE — MAU Note (Signed)
Sierra Evans is a 31 y.o. at [redacted]w[redacted]d here in MAU reporting: lost her mucous plug this morning.  Started contracting at 0100, coming every 7 min.  Having urge to pee, but doesn't need to or can't go.  Feeling pressure.  No hx of PTL.  No bleeding or leaking  Onset of complaint: 0100 Pain score: 9 Vitals:   06/22/21 0828  BP: 117/62  Pulse: 92  Resp: 18  Temp: 98.4 F (36.9 C)  SpO2: 98%     FHT:150 Lab orders placed from triage:  urine

## 2021-06-22 NOTE — MAU Provider Note (Signed)
History     CSN: [id number]  Arrival date and time: 06/22/21 0815   Event Date/Time   First Provider Initiated Contact with Patient 06/22/21 [phone]      Chief Complaint  Patient presents with   Vaginal Discharge   Contractions   Sierra Evans is a 31 y.o. G4P3003 at 67w4dwho receives care at [facility].  She presents today for Vaginal Discharge and Contractions. She reports contractions started at 0105 this morning and have been every 5-7 minutes apart.  She endorses fetal movement and reports loosing her mucous plug.  She denies vaginal discharge, bleeding, and leaking of fluid.     OB History     Gravida  4   Para  3   Term  3   Preterm      AB  0   Living  3      SAB      IAB      Ectopic  0   Multiple      Live Births  3           Past Medical History:  Diagnosis Date   Asthma    Gestational diabetes    Headache    Scoliosis    UTI (urinary tract infection)     Past Surgical History:  Procedure Laterality Date   cervix scrape     ? D&C "for bleeding"   CESAREAN SECTION      Family History  Problem Relation Age of Onset   Alcohol abuse Mother    Cirrhosis Mother    COPD Father    Cancer Brother    Cancer Maternal Grandfather    Stroke Maternal Grandfather    Hypertension Maternal Grandfather    Cancer Paternal Grandmother     Social History   Tobacco Use   Smoking status: Never   Smokeless tobacco: Never  Vaping Use   Vaping Use: Never used  Substance Use Topics   Alcohol use: No   Drug use: No    Allergies:  Allergies  Allergen Reactions   Codeine Hives   Hydrocodone Nausea And Vomiting   Strawberry Extract Hives    Medications Prior to Admission  Medication Sig Dispense Refill Last Dose   benzonatate (TESSALON) 100 MG capsule Take 1 capsule (100 mg total) by mouth every 8 (eight) hours. 21 capsule 0 06/21/2021   metFORMIN (GLUCOPHAGE) 500 MG tablet Take 1 tablet (500 mg total) by mouth at bedtime. 60 tablet 5  06/21/2021   Prenatal MV & Min w/FA-DHA (PRENATAL GUMMIES PO) Take by mouth.   06/21/2021   Accu-Chek Softclix Lancets lancets Use 1 lancet to check blood glucose 4 times daily 100 each 6    aspirin EC 81 MG tablet Take 1 tablet (81 mg total) by mouth daily. Swallow whole. 30 tablet 3    bisacodyl (DULCOLAX) 10 MG suppository Place 1 suppository (10 mg total) rectally as needed for moderate constipation. (Patient not taking: Reported on 12/24/2020) 12 suppository 0    Blood Glucose Monitoring Suppl (ACCU-CHEK GUIDE) w/Device KIT Use meter to check blood glucose 4 times daily 1 kit 0    Blood Pressure Monitoring (BLOOD PRESSURE KIT) DEVI 1 kit by Does not apply route once a week. 1 each 0    Doxylamine-Pyridoxine (DICLEGIS) 10-10 MG TBEC Take 2 tablets by mouth at bedtime. If symptoms persist, add one tablet in the morning and one in the afternoon (Patient not taking: Reported on 03/15/2021) 100 tablet 5  glucose blood (ACCU-CHEK GUIDE) test strip Use 1 test strip to check blood glucose 4 times daily 100 each 6    Misc. Devices MISC Dispense one maternity belt for patient 1 Device 0    polyethylene glycol (MIRALAX / GLYCOLAX) 17 g packet Take 17 g by mouth daily. (Patient not taking: Reported on 12/24/2020) 14 each 0    Prenatal Vit-Fe Fumarate-FA (MULTIVITAMIN-PRENATAL) 27-0.8 MG TABS tablet Take 1 tablet by mouth daily at 12 noon.      promethazine (PHENERGAN) 25 MG tablet Take 1 tablet (25 mg total) by mouth every 6 (six) hours as needed for nausea or vomiting. (Patient not taking: Reported on 03/15/2021) 30 tablet 2    terconazole (TERAZOL 7) 0.4 % vaginal cream Place 1 applicator vaginally at bedtime. (Patient not taking: Reported on 05/12/2021) 45 g 0    VENTOLIN HFA 108 (90 Base) MCG/ACT inhaler INHALE 1-2 PUFFS BY MOUTH EVERY 6 HOURS AS NEEDED FOR WHEEZE OR SHORTNESS OF BREATH 18 each 5     Review of Systems  Constitutional:  Negative for chills and fever.  Gastrointestinal:  Positive for  abdominal pain (Contractions). Negative for nausea and vomiting.  Genitourinary:  Positive for urgency. Negative for difficulty urinating, dysuria and vaginal bleeding.  Neurological:  Negative for dizziness, light-headedness and headaches.  Physical Exam   Blood pressure 117/62, pulse 92, temperature 98.4 F (36.9 C), temperature source Oral, resp. rate 18, height _0  (1.575 m), weight 95.2 kg, last menstrual period 10/23/2020, SpO2 98 %.  Physical Exam Vitals reviewed. Exam conducted with a chaperone present.  Constitutional:      Appearance: Normal appearance.  HENT:     Head: Normocephalic and atraumatic.  Eyes:     Conjunctiva/sclera: Conjunctivae normal.  Cardiovascular:     Rate and Rhythm: Normal rate and regular rhythm.  Pulmonary:     Effort: Pulmonary effort is normal. No respiratory distress.     Breath sounds: Normal breath sounds.  Abdominal:     General: Bowel sounds are normal.  Genitourinary:    Labia:        Right: No tenderness.        Left: No tenderness.      Comments: Vulva Edema Cultures collected by blind swab Dilation: 3 Effacement (%): 50 Cervical Position: Posterior Station: Ballotable Presentation: Vertex Exam by:: Gavin Pound, CNM  Musculoskeletal:        General: Normal range of motion.     Cervical back: Normal range of motion.  Skin:    General: Skin is warm and dry.  Neurological:     Mental Status: She is alert and oriented to person, place, and time.  Psychiatric:        Mood and Affect: Mood normal.        Behavior: Behavior normal.    Fetal Assessment 140 bpm, Mod Var, -Decels, +15x15 Accels Toco: Irritability, Mild ctx palpated  MAU Course   Results for orders placed or performed during the hospital encounter of 06/22/21 (from the past 24 hour(s))  Wet prep, genital     Status: Abnormal   Collection Time: 06/22/21  9:10 AM   Specimen: PATH Cytology Cervicovaginal Ancillary Only  Result Value Ref Range   Yeast Wet Prep  HPF POC PRESENT (A) NONE SEEN   Trich, Wet Prep NEGATIVE (A) NONE SEEN   Clue Cells Wet Prep HPF POC NONE SEEN NONE SEEN   WBC, Wet Prep HPF POC <10 <10   Sperm NONE SEEN  Urinalysis, Routine w reflex microscopic Urine, Clean Catch     Status: Abnormal   Collection Time: 06/22/21  9:17 AM  Result Value Ref Range   Color, Urine AMBER (A) YELLOW   APPearance CLOUDY (A) CLEAR   Specific Gravity, Urine 1.028 1.005 - 1.030   pH 6.0 5.0 - 8.0   Glucose, UA 150 (A) NEGATIVE mg/dL   Hgb urine dipstick NEGATIVE NEGATIVE   Bilirubin Urine NEGATIVE NEGATIVE   Ketones, ur 5 (A) NEGATIVE mg/dL   Protein, ur 30 (A) NEGATIVE mg/dL   Nitrite NEGATIVE NEGATIVE   Leukocytes,Ua MODERATE (A) NEGATIVE   RBC / HPF 0-5 0 - 5 RBC/hpf   WBC, UA 6-10 0 - 5 WBC/hpf   Bacteria, UA MANY (A) NONE SEEN   Squamous Epithelial / LPF 21-50 0 - 5   Mucus PRESENT    Budding Yeast PRESENT    Hyphae Yeast PRESENT    Non Squamous Epithelial 0-5 (A) NONE SEEN   No results found.  MDM PE Labs: UA, UC, Wet Prep, GC/CT EFM Tocolytics Start IV LR Bolus  Pain Medication Prescription Assessment and Plan  31 year old G4P3003  SIUP at 34.4 weeks Cat I FT Contractions  -POC Reviewed -Exam performed and findings discussed. -Cultures collected and pending.  -Start IV with LR Bolus -Start with procardia protocol. -Fentanyl for pain.  -Monitor and await results.    Maryann Conners MSN, CNM 06/22/2021, 9:06 AM   Reassessment (10:57 AM)  -Patient reports contractions continue. -Repeat vaginal exam without change. -Discussed usage of additional tocolytic for patient comfort.  -Reviewed benefits and potential side effects of terbutaline.  -Patient agreeable. -Give additional bag of fluid. -UA notable for many bacteria and yeast. Will send for culture.   Reassessment (11:30 AM)  -Patient reports some relief, but still notes occasional ctx.  -Additional pain medication ordered and nurse to offer.  -Message  sent to office and patient scheduled for Friday June 16th at [facility] for follow up appt. -Precautions to be reviewed with nurse. -Encouraged to call primary office or return to MAU if symptoms worsen or with the onset of new symptoms. -Discharged to home in stable condition.  Maryann Conners MSN, CNM Advanced Practice Provider, [facility]

## 2021-06-23 ENCOUNTER — Encounter: Payer: Self-pay | Admitting: Obstetrics and Gynecology

## 2021-06-23 ENCOUNTER — Ambulatory Visit (INDEPENDENT_AMBULATORY_CARE_PROVIDER_SITE_OTHER): Payer: BC Managed Care – PPO | Admitting: Obstetrics and Gynecology

## 2021-06-23 VITALS — BP 108/71 | HR 71 | Wt 208.0 lb

## 2021-06-23 DIAGNOSIS — Z3009 Encounter for other general counseling and advice on contraception: Secondary | ICD-10-CM

## 2021-06-23 DIAGNOSIS — Z98891 History of uterine scar from previous surgery: Secondary | ICD-10-CM

## 2021-06-23 DIAGNOSIS — Z3483 Encounter for supervision of other normal pregnancy, third trimester: Secondary | ICD-10-CM | POA: Diagnosis not present

## 2021-06-23 DIAGNOSIS — O4700 False labor before 37 completed weeks of gestation, unspecified trimester: Secondary | ICD-10-CM | POA: Insufficient documentation

## 2021-06-23 DIAGNOSIS — Z23 Encounter for immunization: Secondary | ICD-10-CM | POA: Diagnosis not present

## 2021-06-23 DIAGNOSIS — O99820 Streptococcus B carrier state complicating pregnancy: Secondary | ICD-10-CM

## 2021-06-23 DIAGNOSIS — O24415 Gestational diabetes mellitus in pregnancy, controlled by oral hypoglycemic drugs: Secondary | ICD-10-CM

## 2021-06-23 LAB — CULTURE, OB URINE: Culture: 2000 — AB

## 2021-06-23 LAB — GC/CHLAMYDIA PROBE AMP (~~LOC~~) NOT AT ARMC
Chlamydia: NEGATIVE
Comment: NEGATIVE
Comment: NORMAL
Neisseria Gonorrhea: NEGATIVE

## 2021-06-23 MED ORDER — NIFEDIPINE ER OSMOTIC RELEASE 30 MG PO TB24: 30.0000 mg | ORAL_TABLET | Freq: Every day | ORAL | 2 refills | Status: DC

## 2021-06-23 NOTE — Patient Instructions (Signed)
Preventing Preterm Birth Preterm birth is when a baby is delivered between 20 weeks and 37 weeks of pregnancy. A full-term pregnancy lasts for at least 37 complete weeks. Preterm birth can increase the risk of complications for your baby because the baby has not matured fully before being born. How can preterm birth affect my baby? Complications of preterm birth may include: Breathing problems. Problems with vision or hearing. Trouble with feeding. Infections or inflammation of the digestive tract (colitis). Low birth weight or very low birth weight. Brain damage that causes developmental delays and learning disabilities, and that affects movement and coordination (cerebral palsy). Higher risk for diabetes, heart disease, and high blood pressure later in life. What can increase my risk of having a preterm birth? Medical History The exact cause of preterm birth is unknown. The following factors make you more likely to have a preterm birth: Being diagnosed with placenta previa. This is a condition in which the placenta covers the lowest part of your uterus (cervix), which opens into the vagina. Certain conditions of your current and past pregnancies, such as: Having had a preterm birth before. Being pregnant with multiples. Having less than 18 months between pregnancies. Certain abnormalities in your unborn baby. Vaginal bleeding during pregnancy. Becoming pregnant through in vitro fertilization (IVF). Being overweight or underweight. Medical history of: STIs (sexually transmitted infections) or other infections of the urinary tract and the vagina. Long-term (chronic) illnesses, such as blood clotting problems, diabetes, or high blood pressure. Short cervix. Lifestyle and environmental factors Using tobacco products or drugs. Drinking alcohol. Having stress and no social support. Violence in the home (domestic violence). Being exposed to certain chemicals or pollutants in the  environment. What actions can I take to prevent preterm birth?  Medical care The most important thing you can do to lower your risk for preterm birth is to get routine medical care during pregnancy (prenatal care). Keep all follow-up visits. This is important. If you have a high risk of preterm birth: You may be referred to a health care provider who specializes in managing high-risk pregnancies (perinatologist). You may be given medicine to help prevent preterm birth. Lifestyle Certain lifestyle changes can also lower your risk of preterm birth: Wait at least 6 months after a pregnancy to become pregnant again. Get to a healthy weight before getting pregnant. If you are overweight, work with your health care provider to safely lose weight. Do not use any products that contain nicotine or tobacco. These products include cigarettes, chewing tobacco, and vaping devices, such as e-cigarettes. If you need help quitting, ask your health care provider. Do not drink alcohol. Do not use drugs. Eat a healthy diet. Manage other medical problems, such as diabetes or high blood pressure. Where to find support For more support, consider: Talking with your health care provider. Talking with a therapist or substance abuse counselor, if you need help quitting. Working with a Data processing manager or a Systems analyst to maintain a healthy weight. Joining a support group. Where to find more information Learn more about preventing preterm birth from: Centers for Disease Control and Prevention: TonerPromos.no March of Dimes: marchofdimes.org American Pregnancy Association: americanpregnancy.org Contact a health care provider if: You have any of the following symptoms of preterm labor before 37 weeks: A change or increase in vaginal discharge. Fluid leaking from your vagina. Pressure or cramps in your lower abdomen. A backache that does not go away or gets worse. Regular tightening (contractions) in your lower  abdomen. Get help right  away if: You are having regular painful contractions every 5 minutes or less. Your water breaks. Summary Preterm birth means having your baby during weeks 64-37 of pregnancy. Preterm birth may put your baby at risk for health complications. The exact cause of preterm birth is unknown. Getting routine prenatal care can help prevent preterm birth. Keep all follow-up visits. This is important. Contact a health care provider if you have symptoms of preterm labor. This information is not intended to replace advice given to you by your health care provider. Make sure you discuss any questions you have with your health care provider. Document Revised: 01/06/2020 Document Reviewed: 01/06/2020 Elsevier Patient Education  [place].

## 2021-06-23 NOTE — Progress Notes (Signed)
Pt seen in MAU yesterday, 3cm. Pt is still having ctx with Procardia use.

## 2021-06-23 NOTE — Progress Notes (Signed)
Subjective:  Sierra Evans is a 31 y.o. [phone] at [redacted]w[redacted]d being seen today for ongoing prenatal care.  She is currently monitored for the following issues for this high-risk pregnancy and has Supervision of normal pregnancy; History of C-section; Maternal obesity affecting pregnancy, antepartum; Gestational diabetes mellitus (GDM) controlled on oral hypoglycemic drug, antepartum; GBS (group B Streptococcus carrier), +RV culture, currently pregnant; and Preterm uterine contractions on their problem list.  Patient reports seen in MAU yesterday for preterm uterine contractions, SVE 3 cm, Procardia helps a little, no VB.  Contractions: Irregular. Vag. Bleeding: None.  Movement: Present. Denies leaking of fluid.   The following portions of the patient's history were reviewed and updated as appropriate: allergies, current medications, past family history, past medical history, past social history, past surgical history and problem list. Problem list updated.  Objective:   Vitals:   06/23/21 0928  BP: 108/71  Pulse: 71  Weight: 208 lb (94.3 kg)    Fetal Status: Fetal Heart Rate (bpm): 150   Movement: Present     General:  Alert, oriented and cooperative. Patient is in no acute distress.  Skin: Skin is warm and dry. No rash noted.   Cardiovascular: Normal heart rate noted  Respiratory: Normal respiratory effort, no problems with respiration noted  Abdomen: Soft, gravid, appropriate for gestational age. Pain/Pressure: Present     Pelvic:  Cervical exam deferred        Extremities: Normal range of motion.     Mental Status: Normal mood and affect. Normal behavior. Normal judgment and thought content.   Urinalysis:      Assessment and Plan:  Pregnancy: G4P3003 at [redacted]w[redacted]d  1. Encounter for supervision of other normal pregnancy in third trimester Stable  2. History of C-section Consent for VBAC  3. GBS (group B Streptococcus carrier), +RV culture, currently pregnant Tx while in  labor  4. Gestational diabetes mellitus (GDM) controlled on oral hypoglycemic drug, antepartum CBG's in goal range Serial growth scans and antenatal testing as per protocol Continue with qhs Metformin  5. Preterm uterine contractions Will d/c Procardia 10 mg and switch to XL - NIFEdipine (PROCARDIA-XL/NIFEDICAL-XL) 30 MG 24 hr tablet; Take 1 tablet (30 mg total) by mouth daily. Can increase to twice a day as needed for symptomatic contractions  Dispense: 30 tablet; Refill: 2 PTL information provided   Unwanted fertility BTL papers signed today due to Medicaid secondary. Pt has BCBS as primary Preterm labor symptoms and general obstetric precautions including but not limited to vaginal bleeding, contractions, leaking of fluid and fetal movement were reviewed in detail with the patient. Please refer to After Visit Summary for other counseling recommendations.  Return in about 1 week (around 06/30/2021) for OB visit, face to face, MD only.   Chancy Milroy, MD

## 2021-06-26 ENCOUNTER — Inpatient Hospital Stay (HOSPITAL_COMMUNITY): Payer: BC Managed Care – PPO | Admitting: Anesthesiology

## 2021-06-26 ENCOUNTER — Encounter (HOSPITAL_COMMUNITY): Payer: Self-pay | Admitting: Obstetrics & Gynecology

## 2021-06-26 ENCOUNTER — Inpatient Hospital Stay (HOSPITAL_COMMUNITY)
Admission: AD | Admit: 2021-06-26 | Discharge: 2021-06-28 | DRG: 807 | Disposition: A | Discharge status: Home / Self Care | Payer: BC Managed Care – PPO | Attending: Obstetrics & Gynecology | Admitting: Obstetrics & Gynecology

## 2021-06-26 DIAGNOSIS — O34219 Maternal care for unspecified type scar from previous cesarean delivery: Secondary | ICD-10-CM | POA: Diagnosis present

## 2021-06-26 DIAGNOSIS — Z4682 Encounter for fitting and adjustment of non-vascular catheter: Secondary | ICD-10-CM | POA: Diagnosis not present

## 2021-06-26 DIAGNOSIS — O99214 Obesity complicating childbirth: Secondary | ICD-10-CM | POA: Diagnosis not present

## 2021-06-26 DIAGNOSIS — Z3490 Encounter for supervision of normal pregnancy, unspecified, unspecified trimester: Secondary | ICD-10-CM

## 2021-06-26 DIAGNOSIS — Z051 Observation and evaluation of newborn for suspected infectious condition ruled out: Secondary | ICD-10-CM | POA: Diagnosis not present

## 2021-06-26 DIAGNOSIS — O9952 Diseases of the respiratory system complicating childbirth: Secondary | ICD-10-CM | POA: Diagnosis not present

## 2021-06-26 DIAGNOSIS — O99210 Obesity complicating pregnancy, unspecified trimester: Principal | ICD-10-CM

## 2021-06-26 DIAGNOSIS — M549 Dorsalgia, unspecified: Secondary | ICD-10-CM | POA: Diagnosis not present

## 2021-06-26 DIAGNOSIS — O24415 Gestational diabetes mellitus in pregnancy, controlled by oral hypoglycemic drugs: Secondary | ICD-10-CM | POA: Diagnosis not present

## 2021-06-26 DIAGNOSIS — O24425 Gestational diabetes mellitus in childbirth, controlled by oral hypoglycemic drugs: Secondary | ICD-10-CM | POA: Diagnosis not present

## 2021-06-26 DIAGNOSIS — J45909 Unspecified asthma, uncomplicated: Secondary | ICD-10-CM | POA: Diagnosis not present

## 2021-06-26 DIAGNOSIS — Z23 Encounter for immunization: Secondary | ICD-10-CM | POA: Diagnosis not present

## 2021-06-26 DIAGNOSIS — O479 False labor, unspecified: Secondary | ICD-10-CM | POA: Diagnosis not present

## 2021-06-26 DIAGNOSIS — O34211 Maternal care for low transverse scar from previous cesarean delivery: Secondary | ICD-10-CM | POA: Diagnosis not present

## 2021-06-26 DIAGNOSIS — Z3A35 35 weeks gestation of pregnancy: Secondary | ICD-10-CM | POA: Diagnosis not present

## 2021-06-26 DIAGNOSIS — R000 Tachycardia, unspecified: Secondary | ICD-10-CM | POA: Diagnosis not present

## 2021-06-26 DIAGNOSIS — O99824 Streptococcus B carrier state complicating childbirth: Secondary | ICD-10-CM | POA: Diagnosis not present

## 2021-06-26 DIAGNOSIS — O2492 Unspecified diabetes mellitus in childbirth: Secondary | ICD-10-CM | POA: Diagnosis not present

## 2021-06-26 DIAGNOSIS — Z0542 Observation and evaluation of newborn for suspected metabolic condition ruled out: Secondary | ICD-10-CM | POA: Diagnosis not present

## 2021-06-26 DIAGNOSIS — R0603 Acute respiratory distress: Secondary | ICD-10-CM | POA: Diagnosis not present

## 2021-06-26 LAB — CBC
HCT: 33.8 % — ABNORMAL LOW (ref 36.0–46.0)
Hemoglobin: 10.2 g/dL — ABNORMAL LOW (ref 12.0–15.0)
MCH: 26.6 pg (ref 26.0–34.0)
MCHC: 30.2 g/dL (ref 30.0–36.0)
MCV: 88 fL (ref 80.0–100.0)
Platelets: 289 10*3/uL (ref 150–400)
RBC: 3.84 MIL/uL — ABNORMAL LOW (ref 3.87–5.11)
RDW: 15.2 % (ref 11.5–15.5)
WBC: 13.8 10*3/uL — ABNORMAL HIGH (ref 4.0–10.5)
nRBC: 0 % (ref 0.0–0.2)

## 2021-06-26 LAB — TYPE AND SCREEN
ABO/RH(D): O POS
Antibody Screen: NEGATIVE

## 2021-06-26 LAB — RPR: RPR Ser Ql: NONREACTIVE

## 2021-06-26 MED ORDER — DIPHENHYDRAMINE HCL 50 MG/ML IJ SOLN: 12.5000 mg | INTRAMUSCULAR | Status: DC | PRN

## 2021-06-26 MED ORDER — PHENYLEPHRINE 80 MCG/ML (10ML) SYRINGE FOR IV PUSH (FOR BLOOD PRESSURE SUPPORT): 80.0000 ug | PREFILLED_SYRINGE | INTRAVENOUS | Status: DC | PRN

## 2021-06-26 MED ORDER — ONDANSETRON HCL 4 MG/2ML IJ SOLN: 4.0000 mg | INTRAMUSCULAR | Status: DC | PRN

## 2021-06-26 MED ORDER — DIPHENHYDRAMINE HCL 25 MG PO CAPS: 25.0000 mg | ORAL_CAPSULE | Freq: Four times a day (QID) | ORAL | Status: DC | PRN

## 2021-06-26 MED ORDER — OXYTOCIN-SODIUM CHLORIDE 30-0.9 UT/500ML-% IV SOLN
2.5000 [IU]/h | INTRAVENOUS | Status: DC
Start: 2021-06-26 — End: 2021-06-26
  Administered 2021-06-26: 2.5 [IU]/h via INTRAVENOUS

## 2021-06-26 MED ORDER — OXYTOCIN BOLUS FROM INFUSION
333.0000 mL | Freq: Once | INTRAVENOUS | Status: AC
  Administered 2021-06-26: 333 mL via INTRAVENOUS

## 2021-06-26 MED ORDER — SOD CITRATE-CITRIC ACID 500-334 MG/5ML PO SOLN: 30.0000 mL | ORAL | Status: DC | PRN

## 2021-06-26 MED ORDER — ZOLPIDEM TARTRATE 5 MG PO TABS: 5.0000 mg | ORAL_TABLET | Freq: Every evening | ORAL | Status: DC | PRN

## 2021-06-26 MED ORDER — BENZOCAINE-MENTHOL 20-0.5 % EX AERO
1.0000 "application " | INHALATION_SPRAY | CUTANEOUS | Status: DC | PRN
  Administered 2021-06-26: 1 via TOPICAL
  Filled 2021-06-26: qty 56

## 2021-06-26 MED ORDER — COCONUT OIL OIL: 1.0000 "application " | TOPICAL_OIL | Status: DC | PRN

## 2021-06-26 MED ORDER — SODIUM CHLORIDE 0.9 % IV SOLN: 1.0000 g | INTRAVENOUS | Status: DC

## 2021-06-26 MED ORDER — WITCH HAZEL-GLYCERIN EX PADS: 1.0000 "application " | MEDICATED_PAD | CUTANEOUS | Status: DC | PRN

## 2021-06-26 MED ORDER — SENNOSIDES-DOCUSATE SODIUM 8.6-50 MG PO TABS
2.0000 | ORAL_TABLET | ORAL | Status: DC
  Administered 2021-06-26 – 2021-06-28 (×3): 2 via ORAL
  Filled 2021-06-26 (×3): qty 2

## 2021-06-26 MED ORDER — LIDOCAINE HCL (PF) 1 % IJ SOLN: 30.0000 mL | INTRAMUSCULAR | Status: DC | PRN

## 2021-06-26 MED ORDER — EPHEDRINE 5 MG/ML INJ: 10.0000 mg | INTRAVENOUS | Status: DC | PRN

## 2021-06-26 MED ORDER — SIMETHICONE 80 MG PO CHEW: 80.0000 mg | CHEWABLE_TABLET | ORAL | Status: DC | PRN

## 2021-06-26 MED ORDER — LACTATED RINGERS IV SOLN: 500.0000 mL | INTRAVENOUS | Status: DC | PRN

## 2021-06-26 MED ORDER — FENTANYL CITRATE (PF) 100 MCG/2ML IJ SOLN: 100.0000 ug | INTRAMUSCULAR | Status: DC | PRN

## 2021-06-26 MED ORDER — LACTATED RINGERS IV SOLN: INTRAVENOUS | Status: DC

## 2021-06-26 MED ORDER — ONDANSETRON HCL 4 MG/2ML IJ SOLN: 4.0000 mg | Freq: Four times a day (QID) | INTRAMUSCULAR | Status: DC | PRN

## 2021-06-26 MED ORDER — ACETAMINOPHEN 325 MG PO TABS: 650.0000 mg | ORAL_TABLET | ORAL | Status: DC | PRN

## 2021-06-26 MED ORDER — FENTANYL-BUPIVACAINE-NACL 0.5-0.125-0.9 MG/250ML-% EP SOLN
EPIDURAL | Status: AC
  Filled 2021-06-26: qty 250

## 2021-06-26 MED ORDER — OXYCODONE-ACETAMINOPHEN 5-325 MG PO TABS: 1.0000 | ORAL_TABLET | ORAL | Status: DC | PRN

## 2021-06-26 MED ORDER — FENTANYL CITRATE (PF) 100 MCG/2ML IJ SOLN
INTRAMUSCULAR | Status: AC
  Administered 2021-06-26: 100 ug via INTRAVENOUS
  Filled 2021-06-26: qty 2

## 2021-06-26 MED ORDER — SODIUM CHLORIDE 0.9 % IV SOLN
2.0000 g | Freq: Once | INTRAVENOUS | Status: AC
  Administered 2021-06-26: 2 g via INTRAVENOUS
  Filled 2021-06-26: qty 2000

## 2021-06-26 MED ORDER — DIBUCAINE (PERIANAL) 1 % EX OINT: 1.0000 "application " | TOPICAL_OINTMENT | CUTANEOUS | Status: DC | PRN

## 2021-06-26 MED ORDER — LIDOCAINE HCL (PF) 1 % IJ SOLN
INTRAMUSCULAR | Status: DC | PRN
  Administered 2021-06-26: 11 mL via EPIDURAL

## 2021-06-26 MED ORDER — ONDANSETRON HCL 4 MG PO TABS: 4.0000 mg | ORAL_TABLET | ORAL | Status: DC | PRN

## 2021-06-26 MED ORDER — LACTATED RINGERS IV SOLN: 500.0000 mL | Freq: Once | INTRAVENOUS | Status: DC

## 2021-06-26 MED ORDER — TETANUS-DIPHTH-ACELL PERTUSSIS 5-2.5-18.5 LF-MCG/0.5 IM SUSY: 0.5000 mL | PREFILLED_SYRINGE | Freq: Once | INTRAMUSCULAR | Status: DC

## 2021-06-26 MED ORDER — IBUPROFEN 600 MG PO TABS
600.0000 mg | ORAL_TABLET | Freq: Four times a day (QID) | ORAL | Status: DC
  Administered 2021-06-26 – 2021-06-28 (×9): 600 mg via ORAL
  Filled 2021-06-26 (×9): qty 1

## 2021-06-26 MED ORDER — PRENATAL MULTIVITAMIN CH
1.0000 | ORAL_TABLET | Freq: Every day | ORAL | Status: DC
  Administered 2021-06-26 – 2021-06-28 (×3): 1 via ORAL
  Filled 2021-06-26 (×3): qty 1

## 2021-06-26 MED ORDER — OXYCODONE-ACETAMINOPHEN 5-325 MG PO TABS: 2.0000 | ORAL_TABLET | ORAL | Status: DC | PRN

## 2021-06-26 MED ORDER — FENTANYL-BUPIVACAINE-NACL 0.5-0.125-0.9 MG/250ML-% EP SOLN
12.0000 mL/h | EPIDURAL | Status: DC | PRN
  Administered 2021-06-26: 12 mL/h via EPIDURAL

## 2021-06-26 NOTE — Anesthesia Procedure Notes (Signed)
Epidural Patient location during procedure: OB Start time: 06/26/2021 1:55 AM End time: 06/26/2021 2:12 AM  Staffing Anesthesiologist: Lowella Curb, MD Performed: anesthesiologist   Preanesthetic Checklist Completed: patient identified, IV checked, site marked, risks and benefits discussed, surgical consent, monitors and equipment checked, pre-op evaluation and timeout performed  Epidural Patient position: sitting Prep: ChloraPrep Patient monitoring: heart rate, cardiac monitor, continuous pulse ox and blood pressure Approach: midline Location: L2-L3 Injection technique: LOR saline  Needle:  Needle type: Tuohy  Needle gauge: 17 G Needle length: 9 cm Needle insertion depth: 5 cm Catheter type: closed end flexible Catheter size: 20 Guage Catheter at skin depth: 9 cm Test dose: negative  Assessment Events: blood not aspirated, injection not painful, no injection resistance, no paresthesia and negative IV test  Additional Notes Reason for block:procedure for pain

## 2021-06-26 NOTE — Lactation Note (Signed)
This note was copied from a baby's chart.  NICU Lactation Consultation Note  Patient Name: Sierra Evans [id number] Date: 06/26/2021 Age:33 hours   Subjective Reason for consult: Initial assessment; NICU baby; Late-preterm 34-36.6wks  Lactation conducted an initial appointment with Sierra Evans. She has already pumped one time at 1200. We reviewed pump settings and what to expect in days 1-3.   Sierra Evans intends on breastfeeding. I reviewed her wished with her RN, Sierra Evans. Parents are aware of baby's order to PO feed. They prefer to delay use of bottles at this time.   Objective Infant data: Mother's Current Feeding Choice: Breast Milk and Donor Milk  Infant feeding assessment  Maternal data: [id number]  VBAC, Spontaneous  Current breast feeding challenges:: NICU; LPI  Does the patient have breastfeeding experience prior to this delivery?: Yes How long did the patient breastfeed?: 1+ years  Pumping frequency: rec 8+ times a day; approx q3 hours Pumped volume: 5 mL  Pump: Personal (insurance provided electric pump)  Assessment Maternal: Milk volume: Normal   Intervention/Plan Interventions: Breast feeding basics reviewed; Education; DEBP  Tools: Pump Pump Education: Setup, frequency, and cleaning; Milk Storage  Plan: Consult Status: NICU follow-up  NICU Follow-up type: New admission follow up    Sierra Evans 06/26/2021, 2:48 PM

## 2021-06-26 NOTE — Discharge Summary (Signed)
Postpartum Discharge Summary  Date of Service updated***     Patient Name: Sierra Evans DOB: 21-Apr-1990 MRN: [id number]  Date of admission: 06/26/2021 Delivery date:06/26/2021  Delivering provider: Seabron Spates  Date of discharge: 06/26/2021  Admitting diagnosis: Pregnancy [Z34.90], Preterm Labor, Gestational Diabetes, Prior Cesarean Delivery, GBS positive Intrauterine pregnancy: [redacted]w[redacted]d    Secondary diagnosis:  Principal Problem:   Pregnancy  Additional problems: none    Discharge diagnosis: Preterm Pregnancy Delivered, VBAC, and GDM A2                                              Post partum procedures:postpartum tubal ligation Augmentation: AROM Complications: None  Hospital course: Onset of Labor With Vaginal Delivery      31y.o. yo [id number] 381w1das admitted in Active Labor on 06/26/2021. Patient had an uncomplicated labor course as follows:  Membrane Rupture Time/Date: 6:07 AM ,06/26/2021   Delivery Method:VBAC, Spontaneous  Episiotomy: None  Lacerations:  None  Patient had an uncomplicated postpartum course.  She is ambulating, tolerating a regular diet, passing flatus, and urinating well. Patient is discharged home in stable condition on 06/26/21.  Newborn Data: Birth date:06/26/2021  Birth time:7:23 AM  Gender:Female  Living status:  Apgars: ,  Weight:   Magnesium Sulfate received: No BMZ received: No Rhophylac:N/A MMR:{MMR:30440033} T-DaP:{Tdap:23962} Flu: {F{MHW:80881}ransfusion:{Transfusion received:30440034}  Physical exam  Vitals:   06/26/21 0430 06/26/21 0500 06/26/21 0630 06/26/21 0700  BP: 120/64 116/69 118/61 (!) 113/54  Pulse: 69 77 72 72  SpO2:       General: {Exam; general:21111117} Lochia: {Desc; appropriate/inappropriate:30686::"appropriate"} Uterine Fundus: {Desc; firm/soft:30687} Incision: {Exam; incision:21111123} DVT Evaluation: {Exam; dvt:2111122} Labs: Lab Results  Component Value Date   WBC 13.8 (H) 06/26/2021    HGB 10.2 (L) 06/26/2021   HCT 33.8 (L) 06/26/2021   MCV 88.0 06/26/2021   PLT 289 06/26/2021      Latest Ref Rng & Units 11/30/2020   11:04 AM  CMP  Glucose 70 - 99 mg/dL 98   BUN 6 - 20 mg/dL 5   Creatinine 0.44 - 1.00 mg/dL 0.68   Sodium 135 - 145 mmol/L 134   Potassium 3.5 - 5.1 mmol/L 3.7   Chloride 98 - 111 mmol/L 106   CO2 22 - 32 mmol/L 23   Calcium 8.9 - 10.3 mg/dL 8.9   Total Protein 6.5 - 8.1 g/dL 7.0   Total Bilirubin 0.3 - 1.2 mg/dL 0.3   Alkaline Phos 38 - 126 U/L 64   AST 15 - 41 U/L 17   ALT 0 - 44 U/L 15    Edinburgh Score:     No data to display            After visit meds:  Allergies as of 06/26/2021       Reactions   Codeine Hives   Hydrocodone Nausea And Vomiting   Strawberry Extract Hives     Med Rec must be completed prior to using this [facility]        Discharge home in stable condition Infant Feeding: Breast Infant Disposition:{CHL IP OB HOME WITH MOJSRPRX:45859}ischarge instruction: per After Visit Summary and Postpartum booklet. Activity: Advance as tolerated. Pelvic rest for 6 weeks.  Diet: routine diet Anticipated Birth Control: {Birth Control:23956} Postpartum Appointment:{Outpatient follow up:23559} Additional Postpartum F/U: {PP Procedure:23957} Future Appointments: Future  Appointments  Date Time Provider [place]  06/28/2021  7:15 AM [facility] NURSE [facility]  06/28/2021  7:30 AM [facility] US2 [facility]  07/01/2021  9:35 AM Chancy Milroy, MD [place] None  07/05/2021  7:30 AM [facility] NURSE [facility]  07/05/2021  7:45 AM [facility] US5 [facility]  08/11/2021  1:50 PM Ladell Pier, MD [facility] None   Follow up Visit:      06/26/2021 Hansel Feinstein, CNM

## 2021-06-26 NOTE — Progress Notes (Signed)
OB Note I went to see patient re: her desire for a btl. D/w her re: r/b/a to procedure namely to consider it permanent, standard surgical risks and failure risk. Baby is having respiratory issues and patient is dealing with that acuity currently and would like to wait. Belly assessed and fundus not palpable so I feel this is best to. I told her I recommend doing it in a month l/s as an outpatient via salpingectomy and she is amenable to this.  Request sent to OR  Cornelia Copa MD Attending [facility]Faculty Practice) 06/26/2021 Time: [phone]

## 2021-06-26 NOTE — Progress Notes (Signed)
Patient ID: Sierra Evans, female   DOB: [birth year], 31 y.o.   MRN: [id number] Feeling better post epidural   Vitals:   06/26/21 0205 06/26/21 0210  BP: (!) 123/48 132/68  Pulse: 99 79  SpO2: 97% 97%   FHR with ?variable decels with contractions Good variability I witnessed one UC with no decel Will watch and change position prn  Anticipate SVD

## 2021-06-26 NOTE — Anesthesia Preprocedure Evaluation (Signed)
Anesthesia Evaluation  Patient identified by MRN, date of birth, ID band Patient awake    Reviewed: Allergy & Precautions, NPO status , Patient's Chart, lab work & pertinent test results  Airway Mallampati: II  TM Distance: >3 FB Neck ROM: Full    Dental no notable dental hx.    Pulmonary asthma ,    Pulmonary exam normal breath sounds clear to auscultation       Cardiovascular negative cardio ROS Normal cardiovascular exam Rhythm:Regular Rate:Normal     Neuro/Psych  Headaches, negative psych ROS   GI/Hepatic negative GI ROS, Neg liver ROS,   Endo/Other  negative endocrine ROSdiabetes  Renal/GU negative Renal ROS  negative genitourinary   Musculoskeletal negative musculoskeletal ROS (+)   Abdominal (+) + obese,   Peds negative pediatric ROS (+)  Hematology negative hematology ROS (+)   Anesthesia Other Findings   Reproductive/Obstetrics (+) Pregnancy                             Anesthesia Physical Anesthesia Plan  ASA: 3  Anesthesia Plan: Epidural   Post-op Pain Management:    Induction:   PONV Risk Score and Plan:   Airway Management Planned:   Additional Equipment:   Intra-op Plan:   Post-operative Plan:   Informed Consent:   Plan Discussed with:   Anesthesia Plan Comments:         Anesthesia Quick Evaluation

## 2021-06-26 NOTE — H&P (Signed)
Sierra Evans is a 31 y.o. female [phone] at [redacted]w[redacted]d  presenting via EMS for painful contractions  She has been followed at [facility] and has recently been treated for Preterm Labor.  Pregnancy has been remarkable for   Patient Active Problem List   Diagnosis Date Noted   Pregnancy 06/26/2021   GBS (group B Streptococcus carrier), +RV culture, currently pregnant 06/23/2021   Preterm uterine contractions 06/23/2021   Unwanted fertility 06/23/2021   Gestational diabetes mellitus (GDM) controlled on oral hypoglycemic drug, antepartum 06/01/2021   Maternal obesity affecting pregnancy, antepartum 05/10/2021   History of C-section 01/05/2021   Supervision of normal pregnancy 12/24/2020   . OB History     Gravida  4   Para  3   Term  3   Preterm      AB  0   Living  3      SAB      IAB      Ectopic  0   Multiple      Live Births  3          Past Medical History:  Diagnosis Date   Asthma    Gestational diabetes    Headache    Scoliosis    UTI (urinary tract infection)    Past Surgical History:  Procedure Laterality Date   cervix scrape     ? D&C "for bleeding"   CESAREAN SECTION     Family History: family history includes Alcohol abuse in her mother; COPD in her father; Cancer in her brother, maternal grandfather, and paternal grandmother; Cirrhosis in her mother; Hypertension in her maternal grandfather; Stroke in her maternal grandfather. Social History:  reports that she has never smoked. She has never used smokeless tobacco. She reports that she does not drink alcohol and does not use drugs.     Maternal Diabetes: Yes:  Diabetes Type:  Insulin/Medication controlled Genetic Screening: Normal Maternal Ultrasounds/Referrals: Normal Fetal Ultrasounds or other Referrals:  None Maternal Substance Abuse:  No Significant Maternal Medications:  Meds include: Other: Metformin, Procardia for PTL Significant Maternal Lab Results:  Group B Strep positive Other  Comments:   Ampicillin ordered  Review of Systems  Constitutional:  Negative for chills and fever.  Respiratory:  Negative for shortness of breath.   Gastrointestinal:  Positive for abdominal pain.  Genitourinary:  Positive for pelvic pain. Negative for vaginal bleeding.  Musculoskeletal:  Positive for back pain.  Neurological:  Negative for weakness.   Maternal Medical History:  Reason for admission: Contractions.   Contractions: Onset was 1-2 hours ago.   Frequency: regular.   Perceived severity is strong.   Fetal activity: Perceived fetal activity is normal.   Last perceived fetal movement was within the past hour.   Prenatal complications: Preterm labor.   No bleeding, PIH or pre-eclampsia.   Prenatal Complications - Diabetes: gestational. Diabetes is managed by oral agent (monotherapy).     Dilation: 7 Effacement (%): 90 Station: -1 Exam by:: Raelyn Mora, CNM Last menstrual period 10/23/2020. Maternal Exam:  Uterine Assessment: Contraction strength is firm.  Contraction frequency is regular.  Abdomen: Patient reports no abdominal tenderness. Fetal presentation: vertex Introitus: Normal vulva. Normal vagina.  Pelvis: adequate for delivery.   Cervix: Cervix evaluated by digital exam.     Fetal Exam Fetal Monitor Review: Mode: ultrasound.   Baseline rate: 140.  Variability: moderate (6-25 bpm).   Pattern: accelerations present and no decelerations.   Fetal State Assessment: Category I - tracings are  normal.   Physical Exam Constitutional:      General: She is not in acute distress.    Appearance: She is not ill-appearing or toxic-appearing.  HENT:     Head: Normocephalic.  Cardiovascular:     Rate and Rhythm: Normal rate.  Pulmonary:     Effort: Pulmonary effort is normal.  Abdominal:     General: There is no distension.     Tenderness: There is no abdominal tenderness. There is no guarding.  Genitourinary:    General: Normal vulva.     Comments:  Dilation: 7 Effacement (%): 90 Station: -1 Presentation: Vertex Exam by:: Raelyn Mora, CNM  Musculoskeletal:        General: Normal range of motion.     Cervical back: Normal range of motion.  Skin:    General: Skin is warm and dry.  Neurological:     General: No focal deficit present.     Mental Status: She is alert.  Psychiatric:        Mood and Affect: Mood normal.     Prenatal labs: ABO, Rh: O/Positive/-- (12/20 1523) Antibody: Negative (12/20 1523) Rubella: 1.99 (12/20 1523) RPR: Non Reactive (05/02 1117)  HBsAg: Negative (12/20 1523)  HIV: Non Reactive (05/02 1117)  GBS: POSITIVE/-- (06/07 0940)   Assessment/Plan: Single IUP at [redacted]w[redacted]d Preterm Labor Transition of First Stage Labor GBS postitive GDM, oral therapy  Admit to Labor and Delivery Neonatology notified Routine orders Ampicillin for GBS Anticipate SVD   Wynelle Bourgeois 06/26/2021, 1:26 AM

## 2021-06-26 NOTE — MAU Provider Note (Signed)
Ms. SHANEDRA LAVE is a [id number] at [redacted]w[redacted]d presents to MAU via EMS for active labor. Report from EMS is "contracting every 3-5 minutes, no crowning, but something is bulging."  SVE by CNM Dilation: 7 Effacement (%): 90 Station: -1 Presentation: Vertex Exam by:: Raelyn Mora, CNM   NST - FHR: 145 bpm / moderate variability / accels not present / decels absent / TOCO: regular every 3-5 mins   Plan:  Admit to L&D Routine admission orders Wynelle Bourgeois, CNM notified of patient's status and admission -- will assume care upon admission  Raelyn Mora, CNM  06/26/2021 1:18 AM

## 2021-06-27 NOTE — Progress Notes (Signed)
Post Partum Day 1 Subjective: Doing well. No acute events overnight. Pain is controlled and bleeding is appropriate. She is eating, drinking, voiding, and ambulating without issue. Baby currently in NICU.  She has no other concerns at this time.  Objective: Blood pressure 101/84, pulse 92, temperature 97.6 F (36.4 C), temperature source Oral, resp. rate 18, last menstrual period 10/23/2020, SpO2 98 %, unknown if currently breastfeeding.  Physical Exam:  General: alert, cooperative, and no distress CV: RRR Lungs: CTAB Lochia: appropriate Uterine Fundus: firm DVT Evaluation: No evidence of DVT seen on physical exam.  Recent Labs    06/26/21 0123  HGB 10.2*  HCT 33.8*    Assessment/Plan: Sierra Evans is a 31 y.o. s/p VBAC, PPD#1.  Progressing well. Meeting postpartum milestones. VSS. Continue routine postpartum care.  Feeding: in NICU Contraception: interval tubal Circumcision: desires inpatient circumcision   LOS: 1 day   Sharon Seller, DO  06/27/2021, 8:00 AM

## 2021-06-27 NOTE — Anesthesia Postprocedure Evaluation (Signed)
Anesthesia Post Note  Patient: Sierra Evans  Procedure(s) Performed: AN AD HOC LABOR EPIDURAL     Patient location during evaluation: Mother Baby Anesthesia Type: Epidural Level of consciousness: awake Pain management: satisfactory to patient Vital Signs Assessment: post-procedure vital signs reviewed and stable Respiratory status: spontaneous breathing Cardiovascular status: stable Anesthetic complications: no   No notable events documented.  Last Vitals:  Vitals:   06/27/21 0805 06/27/21 1144  BP: (!) 109/56 (!) 141/69  Pulse: 84 (!) 109  Resp: 18 18  Temp: 36.6 C 36.6 C  SpO2: 98% 99%    Last Pain:  Vitals:   06/27/21 1230  TempSrc:   PainSc: 4    Pain Goal:                   [employer]

## 2021-06-27 NOTE — Lactation Note (Signed)
This note was copied from a baby's chart.  NICU Lactation Consultation Note  Patient Name: Sierra Evans [id number] Date: 06/27/2021 Age:33 hours   Subjective Reason for consult: Breastfeeding assistance I assisted mother with positioning baby at breast. He made a couple latch attempts but did not seal and bf. We reviewed normalcy of his behavior and I left mom and baby at breast during the gavage feeding. Mother has not pumped today and I encouraged her to pump after this time with her baby.   Objective Infant data: Mother's Current Feeding Choice: Breast Milk and Donor Milk  Infant feeding assessment Scale for Readiness: 1 Scale for Quality: 3    Maternal data: [id number]  VBAC, Spontaneous  Current breast feeding challenges:: NICU; LPI  Does the patient have breastfeeding experience prior to this delivery?: Yes How long did the patient breastfeed?: 1+ years  Pumping frequency: rec 8+ times a day; approx q3 hours Pumped volume: 5 mL   Pump: Personal (Evenflo)  Assessment Infant: LATCH Score: 5   Maternal: Milk volume: Normal   Intervention/Plan Interventions: Education; Position options  Tools: Pump Pump Education: Setup, frequency, and cleaning; Milk Storage  Plan: Consult Status: NICU follow-up  NICU Follow-up type: Assist with IDF-2 (Mother does not need to pre-pump before breastfeeding); Assist with IDF-1 (Mother to pre-pump before breastfeeding)  Mother to continue lick and learn with infant during feeding times. Mother to begin pumping q3h.  Elder Negus 06/27/2021, 2:52 PM

## 2021-06-27 NOTE — Progress Notes (Signed)
Circumcision Consent  Discussed with mom at bedside about circumcision.   Circumcision is a surgery that removes the skin that covers the tip of the penis, called the "foreskin." Circumcision is usually done when a boy is between 1 and 10 days old, sometimes up to 3-4 weeks old.  The most common reasons boys are circumcised include for cultural/religious beliefs or for parental preference (potentially easier to clean, so baby looks like daddy, etc).  There may be some medical benefits for circumcision:   Circumcised boys seem to have slightly lower rates of: ? Urinary tract infections (per the American Academy of Pediatrics an uncircumcised boy has a 1/100 chance of developing a UTI in the first year of life, a circumcised boy at a 01/998 chance of developing a UTI in the first year of life- a 10% reduction) ? Penis cancer (typically rare- an uncircumcised female has a 1 in 100,000 chance of developing cancer of the penis) ? Sexually transmitted infection (in endemic areas, including HIV, HPV and Herpes- circumcision does NOT protect against gonorrhea, chlamydia, trachomatis, or syphilis) ? Phimosis: a condition where that makes retraction of the foreskin over the glans impossible (0.4 per 1000 boys per year or 0.6% of boys are affected by their 15th birthday)  Boys and men who are not circumcised can reduce these extra risks by: ? Cleaning their penis well ? Using condoms during sex  What are the risks of circumcision?  As with any surgical procedure, there are risks and complications. In circumcision, complications are rare and usually minor, the most common being: ? Bleeding- risk is reduced by holding each clamp for 30 seconds prior to a cut being made, and by holding pressure after the procedure is done ? Infection- the penis is cleaned prior to the procedure, and the procedure is done under sterile technique ? Damage to the urethra or amputation of the penis  What to expect:  The  penis will look red and raw for 5-7 days as it heals. We expect scabbing around where the cut was made, as well as clear-pink fluid and some swelling of the penis right after the procedure. If your baby's circumcision starts to bleed or develops pus, please contact your pediatrician immediately.  All questions were answered and mother consented.  Nicoya Friel, DO Attending Obstetrician & Gynecologist, [facility], [facility]    

## 2021-06-27 NOTE — Lactation Note (Signed)
This note was copied from a baby's chart.  NICU Lactation Consultation Note  Patient Name: Sierra Evans [id number] Date: 06/27/2021 Age:31 years   Subjective Reason for consult: Follow-up assessment Mother initiated pumping last night but has not pumped yet this morning. She is aware of recommendation to pump q3h and plans to resume pumping p breakfast. We reviewed volume norms today and IDF when infant is ready.  Objective Infant data: Mother's Current Feeding Choice: Breast Milk and Donor Milk  Infant feeding assessment Scale for Readiness: 3    Maternal data: [id number]  VBAC, Spontaneous  Current breast feeding challenges:: NICU; LPI  Does the patient have breastfeeding experience prior to this delivery?: Yes How long did the patient breastfeed?: 1+ years  Pumping frequency: rec 8+ times a day; approx q3 hours Pumped volume: 5 mL   Pump: Personal (Evenflo)  Assessment Maternal: Milk volume: Normal   Intervention/Plan Interventions: Education; Infant Driven Feeding Algorithm education  Tools: Pump Pump Education: Setup, frequency, and cleaning; Milk Storage  Plan: Consult Status: NICU follow-up  NICU Follow-up type: Verify absence of engorgement; Verify onset of copious milk; Maternal D/C visit; Weekly NICU follow up; Assist with IDF-1 (Mother to pre-pump before breastfeeding); Assist with IDF-2 (Mother does not need to pre-pump before breastfeeding)  Mother to pump q3h and bring any EBM to NICU.  Elder Negus 06/27/2021, 9:52 AM

## 2021-06-28 ENCOUNTER — Other Ambulatory Visit: Payer: Self-pay

## 2021-06-28 ENCOUNTER — Ambulatory Visit: Payer: BC Managed Care – PPO

## 2021-06-28 MED ORDER — SENNOSIDES-DOCUSATE SODIUM 8.6-50 MG PO TABS
2.0000 | ORAL_TABLET | Freq: Two times a day (BID) | ORAL | 2 refills | Status: DC | PRN
Start: 2021-06-28 — End: 2021-08-04

## 2021-06-28 MED ORDER — IBUPROFEN 600 MG PO TABS: 600.0000 mg | ORAL_TABLET | Freq: Four times a day (QID) | ORAL | 2 refills | Status: DC | PRN

## 2021-06-28 NOTE — Lactation Note (Signed)
This note was copied from a baby's chart.  NICU Lactation Consultation Note  Patient Name: Sierra Evans [id number] Date: 06/28/2021 Age:33 hours   Subjective Reason for consult: Follow-up assessment Mother is pumping frequently and directly bf'ing. She was pleased that infant bf x 30 minutes this morning with audible swallows. We reviewed pumping recommendations. Mother requests observed bf'ing.   Objective Infant data: Mother's Current Feeding Choice: Breast Milk  Infant feeding assessment Scale for Readiness: 2 Scale for Quality: 3    Maternal data: [id number]  VBAC, Spontaneous  Pumping frequency: q3h + direct bf'ing Pumped volume: 80 mL   Pump: Personal (Evenflo)  Assessment Infant: LATCH Score: 8  Maternal: Milk volume: Normal   Intervention/Plan Interventions: Education  Plan: Consult Status: NICU follow-up  NICU Follow-up type: Verify absence of engorgement; Maternal D/C visit; Verify onset of copious milk; Assist with IDF-2 (Mother does not need to pre-pump before breastfeeding)  LC will plan to observe infant at breast today.   Elder Negus 06/28/2021, 10:00 AM

## 2021-06-28 NOTE — Lactation Note (Signed)
This note was copied from a baby's chart. Lactation Consultation Note LC stopped by infant's room to observe 1100 feeding. Infant was in a quiet alert state but not showing feeding cues. We discussed the normalcy of this following the good 8am feeding. LC will plan a return visit to further assess infant at breast.   Patient Name: Sierra Evans [id number] Date: 06/28/2021 Reason for consult: Follow-up assessment Age:33 hours  Feeding Mother's Current Feeding Choice: Breast Milk  LATCH Score Latch: Repeated attempts needed to sustain latch, nipple held in mouth throughout feeding, stimulation needed to elicit sucking reflex.  Audible Swallowing: A few with stimulation  Type of Nipple: Everted at rest and after stimulation  Comfort (Breast/Nipple): Soft / non-tender  Hold (Positioning): No assistance needed to correctly position infant at breast.  LATCH Score: 8   Lactation Tools Discussed/Used Pumping frequency: q3h + direct bf'ing Pumped volume: 80 mL  Interventions Interventions: Education  Consult Status Consult Status: NICU follow-up Date: 06/28/21 Follow-up type: In-patient   Elder Negus 06/28/2021, 11:13 AM

## 2021-06-28 NOTE — Progress Notes (Signed)
Patient screened out for psychosocial assessment since none of the following apply: Psychosocial stressors documented in mother or baby's chart Gestation less than 32 weeks Code at delivery  Infant with anomalies Please contact the Clinical Social Worker if specific needs arise or by MOB's request  MOB's Edinburgh Score is 8.  Laurey Arrow, MSW, LCSW Clinical Social Work [phone]

## 2021-06-29 ENCOUNTER — Other Ambulatory Visit: Payer: Self-pay

## 2021-06-29 NOTE — Progress Notes (Signed)
Correction to previous note..prescription for for postpartum garment was faxed

## 2021-06-29 NOTE — Progress Notes (Signed)
Faxed rx for breast pump

## 2021-06-30 ENCOUNTER — Encounter: Payer: BC Managed Care – PPO | Admitting: Obstetrics and Gynecology

## 2021-07-01 ENCOUNTER — Encounter: Payer: BC Managed Care – PPO | Admitting: Obstetrics and Gynecology

## 2021-07-02 ENCOUNTER — Ambulatory Visit: Payer: Self-pay

## 2021-07-02 NOTE — Lactation Note (Signed)
This note was copied from a baby's chart. Lactation Consultation Note  Patient Name: Sierra Evans Date: 07/02/2021 Reason for consult: Follow-up assessment;NICU baby;Late-preterm 34-36.6wks Age:33 days  Visited with mom of 71 days old LPI NICU female, she voiced her milk is in and that she's been mostly directly breastfeeding and pumping whenever she feels like baby didn't fully empty her breasts. Advised to pump more often since she's still on her calibration period and baby most likely is not fully emptying the breast yet. Observed the 10 am feeding (see LATCH score) and noted that she'll need to pump after this particular feeding, her breast still felt firm. Ms. Ziska is taking baby home today, reviewed discharge education, infant feeding and LC OP referral to [facility], she would like to be F/U and continue working on direct breastfeeding; baby has latched 10 times the last 24 hours at the bare breast; praised her for her efforts.   Provided feeding logs and a pumping top in size XL; she'll call again to have the fitting, unable to do it at this point, mom has holding baby after feeding, he kept crying due to circumcision that took place right before this feeding session. Encouraged mom to continue feeding at the breast on feeding cues at least 8-12 times/24 hours or sooner if feeding cues are present, she's a P4 and very experienced. All questions and concerns answered, family to contact [facility] services PRN.  Feeding Mother's Current Feeding Choice: Breast Milk Nipple Type: Dr. Irving Burton Preemie  LATCH Score Latch: Repeated attempts needed to sustain latch, nipple held in mouth throughout feeding, stimulation needed to elicit sucking reflex. (baby just got off the circumcision table, he went straight to the breast but there were too many distractions for him (temperature checks, wound check, medicine dropper, etc) during this feeding. The entire 15 minutes were not NS, but at least 1/2  of it) Audible Swallowing: Spontaneous and intermittent (loud ones, gulping milk) Type of Nipple: Everted at rest and after stimulation Comfort (Breast/Nipple): Soft / non-tender Hold (Positioning): Assistance needed to correctly position infant at breast and maintain latch. (minimal assistance, just reminded mom to hold the back of baby's neck)  LATCH Score: 8  Lactation Tools Discussed/Used Tools: Pump;Hands-free pumping top (size XL) Breast pump type: Double-Electric Breast Pump Pump Education: Setup, frequency, and cleaning;Milk Storage Reason for Pumping: NICU admission Pumping frequency: 3 times/24 hours + direct breastfeeding Pumped volume: 90 mL (90-120 ml)  Interventions Interventions: Breast feeding basics reviewed;Assisted with latch;Breast massage;Breast compression;Adjust position;Support pillows;DEBP;Education  Discharge Discharge Education: Engorgement and breast care;Outpatient recommendation;Outpatient Epic message sent Pump: DEBP;Personal (Evenflo)  Consult Status Consult Status: Complete Date: 07/02/21 Follow-up type: Call as needed   Sierra Evans 07/02/2021, 12:08 PM

## 2021-07-04 ENCOUNTER — Ambulatory Visit: Payer: BC Managed Care – PPO

## 2021-07-04 NOTE — Progress Notes (Unsigned)
error 

## 2021-07-05 ENCOUNTER — Ambulatory Visit: Payer: BC Managed Care – PPO

## 2021-07-05 ENCOUNTER — Telehealth (HOSPITAL_COMMUNITY): Payer: Self-pay | Admitting: *Deleted

## 2021-07-05 NOTE — Telephone Encounter (Signed)
Patient voiced no questions or concerns regarding her health at this time. EPDS=1. Patient voiced no questions or concerns regarding infant at this time. Patient reports infant sleeps in a crib on his back. RN reviewed ABCs of safe sleep. Patient verbalized understanding. Patient requested RN email information on hospital's virtual postpartum classes and support groups. Email sent. Deforest Hoyles, RN,  07/05/21, [phone]

## 2021-07-13 ENCOUNTER — Ambulatory Visit: Payer: BC Managed Care – PPO | Admitting: Obstetrics and Gynecology

## 2021-07-14 ENCOUNTER — Ambulatory Visit: Payer: Self-pay

## 2021-07-14 NOTE — Telephone Encounter (Signed)
  Chief Complaint: headache, borderline high BP post partum Symptoms: headache- comes and goes- severe when present, borderline high BP Frequency: 4 days- HA Pertinent Negatives: Patient denies fever, stiff neck, blurred vision; swelling of hands, face, or feet Disposition: [x] ED /[] Urgent Care (no appt availability in office) / [] Appointment(In office/virtual)/ []  [place] [facility]/ [] Home Care/ [] Refused Recommended Disposition /[] [place] [facility]/ []  Follow-up with PCP Additional Notes: Due to symptoms- patient is going to check BP when she gets home- she has perimeters and will go to ED to get checked - she states she can not be seen at OB until Monday   Reason for Disposition  [1] SEVERE headache AND [2] < 1 week since delivery  Answer Assessment - Initial Assessment Questions 1. LOCATION: "Where does it hurt?"      Back of head 2. ONSET: "When did the headache start?" (Minutes, hours or days)      Every other day- last 4 days 3. PATTERN: "Does the pain come and go, or has it been constant since it started?"     Comes and goes 4. SEVERITY: "How bad is the pain?" and "What does it keep you from doing?"    - MILD - doesn't interfere with normal activities    - MODERATE - interferes with normal activities or awakens from sleep    - SEVERE - excruciating pain, unable to do any normal activities        None now-severe when occurs- Tylenol helps but it takes forever 5. RECURRENT SYMPTOM: "Have you ever had headaches before?" If Yes, ask: "When was the last time?" and "What happened that time?"      Yes- 2020- possible migraine 6. CAUSE: "What do you think is causing the headache?"     Not sure- BP has been elevated since delivery- 134/84 7. MIGRAINE: "Have you been diagnosed with migraine headaches?" If Yes, ask: "Is this headache similar?"      Yes- no 8. PREECLAMPSIA:  "Were you diagnosed with preeclampsia during your pregnancy?"       no 9. HEAD INJURY: "Has there been  any recent injury to the head?"      no 10. OTHER SYMPTOMS: "Do you have any other symptoms?" (e.g., fever, stiff neck, blurred vision; swelling of hands, face, or feet)       no 11. DELIVERY DATE: "When was your delivery date?" "Vaginal delivery or c-section?" "Did you have an epidural block during childbirth?"       06/26/21- vaginal delivery, epideral  Protocols used: Postpartum - Headache-A-AH

## 2021-07-14 NOTE — Telephone Encounter (Signed)
Patient is concerned Home nurse came out for post partum check and patient BP is borderline high- 134/84. Patient reports frequent severe headaches for the last 4 days.

## 2021-07-14 NOTE — Telephone Encounter (Signed)
Summary: Blood pressure   Called in saying the home health nurse came by around 2:30 pm and checked her blood pressure.  It was 138/84 she is also having headaches.  She was told to call the primary care office and see if she could get in before her  NP appt on the 6/27.  Pt just had a baby.   Pt did contact her OBGYN and they can not see her sooner than her appt she has already on Monday.   Call back  [phone]      Call pt  - Mailbox full - unable to [facility]

## 2021-07-18 ENCOUNTER — Ambulatory Visit (INDEPENDENT_AMBULATORY_CARE_PROVIDER_SITE_OTHER): Payer: BC Managed Care – PPO | Admitting: Emergency Medicine

## 2021-07-18 DIAGNOSIS — O165 Unspecified maternal hypertension, complicating the puerperium: Secondary | ICD-10-CM

## 2021-07-18 NOTE — Progress Notes (Addendum)
PP BP check 115/74,  Denies headaches, vision changes.

## 2021-07-18 NOTE — Progress Notes (Signed)
Patient was assessed and managed by nursing staff during this encounter. I have reviewed the chart and agree with the documentation and plan. I have also made any necessary editorial changes.  Warden Fillers, MD 07/18/2021 10:25 AM

## 2021-07-25 DIAGNOSIS — R102 Pelvic and perineal pain: Secondary | ICD-10-CM | POA: Diagnosis not present

## 2021-08-04 ENCOUNTER — Encounter (HOSPITAL_BASED_OUTPATIENT_CLINIC_OR_DEPARTMENT_OTHER): Payer: Self-pay | Admitting: Obstetrics and Gynecology

## 2021-08-04 NOTE — Progress Notes (Addendum)
Spoke w/ via phone for pre-op interview--- patient Lab needs dos----  POCT urine pregnancy             Lab results------ in Epic COVID test -----patient states asymptomatic no test needed Arrive at ------- 0830 on Wednesday 08/17/2021 NPO after MN NO Solid Food.  Clear liquids from MN until--- 0730 on 08/17/21 Med rec completed Medications to take morning of surgery ----- ventolin inhaler Diabetic medication ----- n/a Patient instructed no nail polish to be worn day of surgery Patient instructed to bring photo id and insurance card day of surgery Patient aware to have Driver (ride ) / caregiver    for 24 hours after surgery - unknown at this time Patient Special Instructions ----- none Pre-Op special Istructions ----- n/a Patient verbalized understanding of instructions that were given at this phone interview. Patient denies shortness of breath, chest pain, fever, cough at this phone interview.  IB message sent in Epic to Dr. Vergie Living to request pre-op orders.   Frances Furbish, RN

## 2021-08-08 ENCOUNTER — Other Ambulatory Visit: Payer: Self-pay | Admitting: Obstetrics and Gynecology

## 2021-08-08 DIAGNOSIS — Z01818 Encounter for other preprocedural examination: Secondary | ICD-10-CM

## 2021-08-10 ENCOUNTER — Ambulatory Visit: Payer: BC Managed Care – PPO | Admitting: Obstetrics and Gynecology

## 2021-08-10 ENCOUNTER — Other Ambulatory Visit: Payer: BC Managed Care – PPO

## 2021-08-11 ENCOUNTER — Ambulatory Visit: Payer: BC Managed Care – PPO | Attending: Internal Medicine | Admitting: Internal Medicine

## 2021-08-16 ENCOUNTER — Encounter (HOSPITAL_BASED_OUTPATIENT_CLINIC_OR_DEPARTMENT_OTHER): Payer: Self-pay | Admitting: Anesthesiology

## 2021-08-17 ENCOUNTER — Ambulatory Visit (HOSPITAL_BASED_OUTPATIENT_CLINIC_OR_DEPARTMENT_OTHER)
Admission: RE | Admit: 2021-08-17 | Payer: BC Managed Care – PPO | Source: Home / Self Care | Admitting: Obstetrics and Gynecology

## 2021-08-17 ENCOUNTER — Encounter: Payer: Self-pay | Admitting: Obstetrics and Gynecology

## 2021-08-17 DIAGNOSIS — Z3009 Encounter for other general counseling and advice on contraception: Secondary | ICD-10-CM

## 2021-08-17 HISTORY — DX: Nontoxic goiter, unspecified: E04.9

## 2021-08-17 HISTORY — DX: Generalized anxiety disorder: F41.1

## 2021-08-17 SURGERY — SALPINGECTOMY, BILATERAL, LAPAROSCOPIC
Anesthesia: Choice | Laterality: Bilateral

## 2021-08-17 NOTE — Anesthesia Preprocedure Evaluation (Deleted)
Anesthesia Evaluation    Reviewed: Allergy & Precautions, Patient's Chart, lab work & pertinent test results  Airway        Dental   Pulmonary asthma ,           Cardiovascular negative cardio ROS       Neuro/Psych  Headaches, PSYCHIATRIC DISORDERS Anxiety    GI/Hepatic negative GI ROS, Neg liver ROS,   Endo/Other  diabetesObese BMI 36  Renal/GU negative Renal ROS  negative genitourinary   Musculoskeletal negative musculoskeletal ROS (+)   Abdominal   Peds  Hematology  (+) Blood dyscrasia, anemia ,   Anesthesia Other Findings   Reproductive/Obstetrics                            Anesthesia Physical Anesthesia Plan  ASA: 2  Anesthesia Plan: General   Post-op Pain Management: Tylenol PO (pre-op)*   Induction: Intravenous  PONV Risk Score and Plan: 3 and Midazolam, Dexamethasone and Ondansetron  Airway Management Planned: Oral ETT  Additional Equipment:   Intra-op Plan:   Post-operative Plan: Extubation in OR  Informed Consent: I have reviewed the patients History and Physical, chart, labs and discussed the procedure including the risks, benefits and alternatives for the proposed anesthesia with the patient or authorized representative who has indicated his/her understanding and acceptance.     Dental advisory given  Plan Discussed with: CRNA  Anesthesia Plan Comments: (Case cancelled as pt did not show up)       Anesthesia Quick Evaluation

## 2021-08-23 NOTE — Progress Notes (Signed)
Patient did not keep her BTL surgery for today 08/17/2021; OR unable to get a hold of patient on the phone.   Cornelia Copa MD Attending [facility] for [employer] [facility])

## 2021-11-09 ENCOUNTER — Telehealth (INDEPENDENT_AMBULATORY_CARE_PROVIDER_SITE_OTHER): Payer: BC Managed Care – PPO

## 2021-11-09 DIAGNOSIS — Z30013 Encounter for initial prescription of injectable contraceptive: Secondary | ICD-10-CM

## 2021-11-09 DIAGNOSIS — R1011 Right upper quadrant pain: Secondary | ICD-10-CM

## 2021-11-09 DIAGNOSIS — Z302 Encounter for sterilization: Secondary | ICD-10-CM | POA: Diagnosis not present

## 2021-11-09 MED ORDER — MEDROXYPROGESTERONE ACETATE 150 MG/ML IM SUSP: 150.0000 mg | INTRAMUSCULAR | 0 refills | Status: DC

## 2021-11-09 NOTE — Progress Notes (Signed)
Called patient and used 2 different identifiers to ensure I was speaking to the correct person. Pt states she is ready to return to work on Monday October 30th, and wanted to be sure that was OK per the provider. Pt also states that she will need an accomodation for breast feeding. I was also informed that the pt is experiencing RUQ pain for approx 3 days. States that it is dull in nature, and 9/10 at its worst. Tried Tylenol for the pain, but it was not helpful. Pt has not checked her BP in over 2 weeks. Denies any other symptoms. No other concerns noted, and pt was informed provider will send a MyChart link when ready to connect. Pt verbalized understanding.

## 2021-11-09 NOTE — Progress Notes (Unsigned)
    GYNECOLOGY VIRTUAL VISIT ENCOUNTER NOTE  Provider location: [facility] {[place] single:[id number]::"[facility]"}   Patient location: Home  I connected with Sierra Evans on 11/09/21 at  4:10 PM EDT by MyChart Video Encounter and verified that I am speaking with the correct person using two identifiers.   I discussed the limitations, risks, security and privacy concerns of performing an evaluation and management service virtually and the availability of in person appointments. I also discussed with the patient that there may be a patient responsible charge related to this service. The patient expressed understanding and agreed to proceed.   History:  Sierra Evans is a 31 y.o. [phone] female being evaluated today for ***. She denies any abnormal vaginal discharge, bleeding, pelvic pain or other concerns.       Past Medical History:  Diagnosis Date   Asthma    GAD (generalized anxiety disorder)    Gestational diabetes    Headache    Scoliosis    Thyroid goiter    UTI (urinary tract infection)    Past Surgical History:  Procedure Laterality Date   cervix scrape     ? D&C "for bleeding"   CESAREAN SECTION     HYSTEROSCOPY  03/2018   The following portions of the patient's history were reviewed and updated as appropriate: allergies, current medications, past family history, past medical history, past social history, past surgical history and problem list.   Health Maintenance:  Normal pap and negative HRHPV on ***.  Normal mammogram on ***.   Review of Systems:  Pertinent items noted in HPI and remainder of comprehensive ROS otherwise negative.  Physical Exam:   General:  Alert, oriented and cooperative. Patient appears to be in no acute distress.  Mental Status: Normal mood and affect. Normal behavior. Normal judgment and thought  content.   Respiratory: Normal respiratory effort, no problems with respiration noted  Rest of physical exam deferred due to type of encounter  Labs and Imaging No results found for this or any previous visit (from the past 336 hour(s)). No results found.     Assessment and Plan:     1. Encounter for initial prescription of injectable contraceptive *** - US Abdomen Limited RUQ (LIVER/GB); Future  2. Right upper quadrant pain *** - US Abdomen Limited RUQ (LIVER/GB); Future       I discussed the assessment and treatment plan with the patient. The patient was provided an opportunity to ask questions and all were answered. The patient agreed with the plan and demonstrated an understanding of the instructions.   The patient was advised to call back or seek an in-person evaluation/go to the ED if the symptoms worsen or if the condition fails to improve as anticipated.  I provided 14 minutes of face-to-face time during this encounter.   Maryann Conners, [place] for [employer], [place]

## 2021-11-10 ENCOUNTER — Ambulatory Visit (INDEPENDENT_AMBULATORY_CARE_PROVIDER_SITE_OTHER): Payer: BC Managed Care – PPO

## 2021-11-10 DIAGNOSIS — Z30013 Encounter for initial prescription of injectable contraceptive: Secondary | ICD-10-CM

## 2021-11-10 LAB — POCT URINE PREGNANCY: Preg Test, Ur: NEGATIVE

## 2021-11-10 MED ORDER — MEDROXYPROGESTERONE ACETATE 150 MG/ML IM SUSP
150.0000 mg | Freq: Once | INTRAMUSCULAR | Status: AC
  Administered 2021-11-10: 150 mg via INTRAMUSCULAR

## 2021-11-10 NOTE — Progress Notes (Signed)
Patient presents for Depo inj. Injection given in RD. Patient tolerated well. Okay to give depo per Gavin Pound, CNM. Next depo due 1/11-1/25. Office supply provided.

## 2021-11-16 ENCOUNTER — Ambulatory Visit (HOSPITAL_BASED_OUTPATIENT_CLINIC_OR_DEPARTMENT_OTHER): Payer: BC Managed Care – PPO

## 2021-11-22 NOTE — Telephone Encounter (Signed)
Called to get PT rescheduled for OB appt

## 2021-11-25 DIAGNOSIS — R5383 Other fatigue: Secondary | ICD-10-CM | POA: Diagnosis not present

## 2021-11-25 DIAGNOSIS — D509 Iron deficiency anemia, unspecified: Secondary | ICD-10-CM | POA: Diagnosis not present

## 2021-11-25 DIAGNOSIS — E010 Iodine-deficiency related diffuse (endemic) goiter: Secondary | ICD-10-CM | POA: Diagnosis not present

## 2021-12-02 ENCOUNTER — Ambulatory Visit (HOSPITAL_BASED_OUTPATIENT_CLINIC_OR_DEPARTMENT_OTHER)
Admission: RE | Admit: 2021-12-02 | Discharge: 2021-12-02 | Disposition: A | Discharge status: Home / Self Care | Payer: BC Managed Care – PPO | Source: Ambulatory Visit

## 2021-12-02 DIAGNOSIS — R1011 Right upper quadrant pain: Secondary | ICD-10-CM | POA: Diagnosis not present

## 2021-12-02 DIAGNOSIS — Z30013 Encounter for initial prescription of injectable contraceptive: Secondary | ICD-10-CM | POA: Diagnosis not present

## 2021-12-13 ENCOUNTER — Telehealth: Payer: Self-pay | Admitting: Emergency Medicine

## 2021-12-13 NOTE — Telephone Encounter (Signed)
Informed of [place] results at pt's request.

## 2021-12-15 ENCOUNTER — Emergency Department (HOSPITAL_COMMUNITY): Payer: BC Managed Care – PPO

## 2021-12-15 ENCOUNTER — Encounter (HOSPITAL_COMMUNITY): Payer: Self-pay | Admitting: Emergency Medicine

## 2021-12-15 ENCOUNTER — Other Ambulatory Visit: Payer: Self-pay

## 2021-12-15 ENCOUNTER — Emergency Department (HOSPITAL_COMMUNITY)
Admission: EM | Admit: 2021-12-15 | Discharge: 2021-12-15 | Disposition: A | Discharge status: Home / Self Care | Payer: BC Managed Care – PPO | Attending: Emergency Medicine | Admitting: Emergency Medicine

## 2021-12-15 DIAGNOSIS — R1013 Epigastric pain: Secondary | ICD-10-CM | POA: Insufficient documentation

## 2021-12-15 DIAGNOSIS — N9489 Other specified conditions associated with female genital organs and menstrual cycle: Secondary | ICD-10-CM | POA: Insufficient documentation

## 2021-12-15 DIAGNOSIS — R1011 Right upper quadrant pain: Secondary | ICD-10-CM | POA: Insufficient documentation

## 2021-12-15 DIAGNOSIS — R109 Unspecified abdominal pain: Secondary | ICD-10-CM | POA: Diagnosis not present

## 2021-12-15 LAB — COMPREHENSIVE METABOLIC PANEL
ALT: 17 U/L (ref 0–44)
AST: 17 U/L (ref 15–41)
Albumin: 3.6 g/dL (ref 3.5–5.0)
Alkaline Phosphatase: 89 U/L (ref 38–126)
Anion gap: 6 (ref 5–15)
BUN: 9 mg/dL (ref 6–20)
CO2: 23 mmol/L (ref 22–32)
Calcium: 9.1 mg/dL (ref 8.9–10.3)
Chloride: 110 mmol/L (ref 98–111)
Creatinine, Ser: 0.79 mg/dL (ref 0.44–1.00)
GFR, Estimated: >60 mL/min (ref >60)
Glucose, Bld: 97 mg/dL (ref 70–99)
Potassium: 4 mmol/L (ref 3.5–5.1)
Sodium: 139 mmol/L (ref 135–145)
Total Bilirubin: 0.2 mg/dL — ABNORMAL LOW (ref 0.3–1.2)
Total Protein: 7.2 g/dL (ref 6.5–8.1)

## 2021-12-15 LAB — CBC
HCT: 38.5 % (ref 36.0–46.0)
Hemoglobin: 12.2 g/dL (ref 12.0–15.0)
MCH: 28.7 pg (ref 26.0–34.0)
MCHC: 31.7 g/dL (ref 30.0–36.0)
MCV: 90.6 fL (ref 80.0–100.0)
Platelets: 345 10*3/uL (ref 150–400)
RBC: 4.25 MIL/uL (ref 3.87–5.11)
RDW: 13.9 % (ref 11.5–15.5)
WBC: 6.9 10*3/uL (ref 4.0–10.5)
nRBC: 0 % (ref 0.0–0.2)

## 2021-12-15 LAB — URINALYSIS, ROUTINE W REFLEX MICROSCOPIC
Bilirubin Urine: NEGATIVE
Glucose, UA: NEGATIVE mg/dL
Ketones, ur: NEGATIVE mg/dL
Leukocytes,Ua: NEGATIVE
Nitrite: NEGATIVE
Protein, ur: 100 mg/dL — AB
Specific Gravity, Urine: 1.02 (ref 1.005–1.030)
pH: 8.5 — ABNORMAL HIGH (ref 5.0–8.0)

## 2021-12-15 LAB — I-STAT BETA HCG BLOOD, ED (MC, WL, AP ONLY): I-stat hCG, quantitative: <5 m[IU]/mL (ref <5)

## 2021-12-15 LAB — URINALYSIS, MICROSCOPIC (REFLEX)

## 2021-12-15 LAB — LIPASE, BLOOD: Lipase: 38 U/L (ref 11–51)

## 2021-12-15 MED ORDER — FAMOTIDINE 20 MG PO TABS: 20.0000 mg | ORAL_TABLET | Freq: Two times a day (BID) | ORAL | 0 refills | Status: DC

## 2021-12-15 MED ORDER — OXYCODONE-ACETAMINOPHEN 5-325 MG PO TABS
1.0000 | ORAL_TABLET | Freq: Once | ORAL | Status: AC
  Administered 2021-12-15: 1 via ORAL
  Filled 2021-12-15: qty 1

## 2021-12-15 MED ORDER — SUCRALFATE 1 G PO TABS: 1.0000 g | ORAL_TABLET | Freq: Three times a day (TID) | ORAL | 0 refills | Status: DC

## 2021-12-15 MED ORDER — IOHEXOL 350 MG/ML SOLN
75.0000 mL | Freq: Once | INTRAVENOUS | Status: AC | PRN
  Administered 2021-12-15: 75 mL via INTRAVENOUS

## 2021-12-15 MED ORDER — KETOROLAC TROMETHAMINE 30 MG/ML IJ SOLN
30.0000 mg | Freq: Once | INTRAMUSCULAR | Status: AC
  Administered 2021-12-15: 30 mg via INTRAVENOUS
  Filled 2021-12-15: qty 1

## 2021-12-15 NOTE — ED Notes (Signed)
RUQ pain. Comes and goes. 5 months post partum. No hx hernia. BM normal, no urinary changes, reports excessive hair loss.

## 2021-12-15 NOTE — ED Provider Triage Note (Signed)
Emergency Medicine Provider Triage Evaluation Note  Sierra Evans , a 31 y.o. female  was evaluated in triage.  Pt complains of right upper quadrant pain for 1 month.  She reports that she recently had ultrasounds done about a week ago which were all reassuring with no evidence of hepatobiliary disease.  Denies fever, nausea, vomiting, diarrhea, and melena.  Review of Systems  Positive: As above Negative: As above  Physical Exam  BP 124/77 (BP Location: Right Arm)   Pulse 92   Temp 98.2 F (36.8 C)   Resp 16   Ht 5\' 2"  (1.575 m)   Wt 88 kg   SpO2 100%   BMI 35.48 kg/m  Gen:   Awake, no distress   Resp:  Normal effort  MSK:   Moves extremities without difficulty  Other:  Right upper quadrant pain on palpation  Medical Decision Making  Medically screening exam initiated at 1:42 PM.  Appropriate orders placed.  was informed that the remainder of the evaluation will be completed by another provider, this initial triage assessment does not replace that evaluation, and the importance of remaining in the ED until their evaluation is complete.  Right upper quadrant pain rule out   [name], PA-C 12/15/21 1345

## 2021-12-15 NOTE — ED Notes (Signed)
Received verbal report from Connor L RN at this time 

## 2021-12-15 NOTE — ED Triage Notes (Signed)
Pt reports RUQ abd pain for a month. Pt had [place] for gallbladder that was normal. Pt also on menstrual cycle for 11 days.

## 2021-12-15 NOTE — ED Notes (Signed)
ED provider at bedside.

## 2021-12-15 NOTE — Discharge Instructions (Signed)
I placed a referral to the gastroenterologist.  If you do not hear from them by next week to schedule appointment please call them.  The number is listed in your discharge paperwork.  In the meantime follow a low fat diet and limit carbonated beverages as well as greasy foods.  Take the medications as prescribed.  Return for any new or worsening symptoms

## 2021-12-15 NOTE — ED Provider Notes (Signed)
[facility] EMERGENCY DEPARTMENT Provider Note   CSN: [id number] Arrival date & time: 12/15/21  1203     History  Chief Complaint  Patient presents with   Abdominal Pain    Sierra Evans is a 31 y.o. female with a past medical history here for evaluation of abdominal pain.  Has had diffuse right upper quadrant, epigastric and mid right abdominal pain over the last month.  Had prior ultrasounds done which did not show any stones or infection.  She denies any fever, nausea, vomiting, changes in stools.  No back pain or flank pain.  Does note she has been on her menstrual cycle over the last 10 days after getting birth control shot.  She is 5 months postpartum.  No lower abdominal pain.  Taking Tylenol at home.  No chest pain, shortness of breath, lower extremity swelling or pain.  No history of PE or DVT.  No cough  HPI     Home Medications Prior to Admission medications   Medication Sig Start Date End Date Taking? Authorizing Provider  famotidine (PEPCID) 20 MG tablet Take 1 tablet (20 mg total) by mouth 2 (two) times daily. 12/15/21  Yes Raylyn Speckman A, PA-C  sucralfate (CARAFATE) 1 g tablet Take 1 tablet (1 g total) by mouth 4 (four) times daily -  with meals and at bedtime for 14 days. 12/15/21 12/29/21 Yes Mikalyn Hermida A, PA-C  ibuprofen (ADVIL) 600 MG tablet Take 1 tablet (600 mg total) by mouth every 6 (six) hours as needed for mild pain, headache, moderate pain or cramping. 06/28/21   Anyanwu, Jethro Bastos, MD  medroxyPROGESTERone (DEPO-PROVERA) 150 MG/ML injection Inject 1 mL (150 mg total) into the muscle every 3 (three) months. 11/09/21   Gerrit Heck, CNM  Prenatal Vit-Fe Fumarate-FA (MULTIVITAMIN-PRENATAL) 27-0.8 MG TABS tablet Take 1 tablet by mouth daily at 12 noon.    [provider]  VENTOLIN HFA 108 (90 Base) MCG/ACT inhaler INHALE 1-2 PUFFS BY MOUTH EVERY 6 HOURS AS NEEDED FOR WHEEZE OR SHORTNESS OF BREATH 04/27/21   Brock Bad, MD  ferrous sulfate 325 (65 FE) MG tablet Take by mouth.  07/31/19  [provider]  loratadine (CLARITIN) 10 MG tablet Take by mouth.  07/31/19  [provider]      Allergies    Codeine, Hydrocodone, and Strawberry extract    Review of Systems   Review of Systems  Constitutional: Negative.   HENT: Negative.    Respiratory: Negative.    Cardiovascular: Negative.   Gastrointestinal:  Positive for abdominal pain. Negative for abdominal distention, anal bleeding, blood in stool, constipation, diarrhea, nausea, rectal pain and vomiting.  Genitourinary:  Positive for menstrual problem and vaginal bleeding. Negative for decreased urine volume, difficulty urinating, dyspareunia, dysuria, enuresis, flank pain, frequency, genital sores, hematuria, pelvic pain, urgency, vaginal discharge and vaginal pain.  Musculoskeletal: Negative.   Skin: Negative.   Neurological: Negative.   All other systems reviewed and are negative.   Physical Exam Updated Vital Signs BP 124/68 (BP Location: Right Arm)   Pulse 75   Temp 98.4 F (36.9 C) (Oral)   Resp 16   Ht 5\' 2"  (1.575 m)   Wt 88 kg   SpO2 99%   BMI 35.48 kg/m  Physical Exam Vitals and nursing note reviewed.  Constitutional:      General: She is not in acute distress.    Appearance: She is well-developed. She is not ill-appearing, toxic-appearing or diaphoretic.  HENT:     Head: Normocephalic and atraumatic.  Eyes:     Pupils: Pupils are equal, round, and reactive to light.  Cardiovascular:     Rate and Rhythm: Normal rate.     Heart sounds: Normal heart sounds.  Pulmonary:     Effort: Pulmonary effort is normal. No respiratory distress.     Breath sounds: Normal breath sounds.  Abdominal:     General: There is no distension.     Palpations: Abdomen is soft.     Tenderness: There is abdominal tenderness in the right upper quadrant, epigastric area, periumbilical area and left upper quadrant. There is no right CVA  tenderness, left CVA tenderness, guarding or rebound. Negative signs include Murphy's sign and McBurney's sign.  Musculoskeletal:        General: Normal range of motion.     Cervical back: Normal range of motion.  Skin:    General: Skin is warm and dry.  Neurological:     General: No focal deficit present.     Mental Status: She is alert.  Psychiatric:        Mood and Affect: Mood normal.    ED Results / Procedures / Treatments   Labs (all labs ordered are listed, but only abnormal results are displayed) Labs Reviewed  COMPREHENSIVE METABOLIC PANEL - Abnormal; Notable for the following components:      Result Value   Total Bilirubin 0.2 (*)    All other components within normal limits  URINALYSIS, ROUTINE W REFLEX MICROSCOPIC - Abnormal; Notable for the following components:   pH 8.5 (*)    Hgb urine dipstick LARGE (*)    Protein, ur 100 (*)    All other components within normal limits  URINALYSIS, MICROSCOPIC (REFLEX) - Abnormal; Notable for the following components:   Bacteria, UA RARE (*)    All other components within normal limits  LIPASE, BLOOD  CBC  I-STAT BETA HCG BLOOD, ED ([facility], [facility], [facility] ONLY)    EKG None  Radiology CT ABDOMEN PELVIS W CONTRAST  Result Date: 12/15/2021 CLINICAL DATA:  Abdominal pain, acute, nonlocalized diffuse right abd pain. Five months post partum EXAM: CT ABDOMEN AND PELVIS WITH CONTRAST TECHNIQUE: Multidetector CT imaging of the abdomen and pelvis was performed using the standard protocol following bolus administration of intravenous contrast. RADIATION DOSE REDUCTION: This exam was performed according to the departmental dose-optimization program which includes automated exposure control, adjustment of the mA and/or kV according to patient size and/or use of iterative reconstruction technique. CONTRAST:  75mL OMNIPAQUE IOHEXOL 350 MG/ML SOLN COMPARISON:  None Available. FINDINGS: Lower chest: No acute abnormality. Hepatobiliary: No focal liver  abnormality. No gallstones, gallbladder wall thickening, or pericholecystic fluid. No biliary dilatation. Pancreas: No focal lesion. Normal pancreatic contour. No surrounding inflammatory changes. No main pancreatic ductal dilatation. Spleen: Normal in size without focal abnormality. Adrenals/Urinary Tract: No adrenal nodule bilaterally. Bilateral kidneys enhance symmetrically. No hydronephrosis. No hydroureter. The urinary bladder is decompressed and grossly unremarkable. Stomach/Bowel: Stomach is within normal limits. No evidence of bowel wall thickening or dilatation. Stool throughout the majority of the colon. Appendix appears normal. Vascular/Lymphatic: No abdominal aorta or iliac aneurysm. Mild atherosclerotic plaque of the aorta and its branches. No abdominal, pelvic, or inguinal lymphadenopathy. Reproductive: Uterus and bilateral adnexa are unremarkable. Other: CT body other Musculoskeletal: No abdominal wall hernia or abnormality.  Diastasis rectus. No suspicious lytic or blastic osseous lesions. No acute displaced fracture. IMPRESSION: 1. No acute intra-abdominal or intrapelvic abnormality 2. Stool  throughout the majority of the colon-correlate for constipation. Electronically Signed   By: Tish Frederickson M.D.   On: 12/15/2021 22:07   US Abdomen Limited RUQ (LIVER/GB)  Result Date: 12/15/2021 CLINICAL DATA:  Right upper quadrant pain EXAM: ULTRASOUND ABDOMEN LIMITED RIGHT UPPER QUADRANT COMPARISON:  12/02/2021 FINDINGS: Gallbladder: No gallstones or wall thickening visualized. No sonographic Murphy sign noted by sonographer. Common bile duct: Diameter: 4 mm. Liver: No focal lesion identified. Within normal limits in parenchymal echogenicity. Portal vein is patent on color Doppler imaging with normal direction of blood flow towards the liver. Other: None. IMPRESSION: Unremarkable right upper quadrant ultrasound. Electronically Signed   By: Duanne Guess D.O.   On: 12/15/2021 14:26     Procedures Procedures    Medications Ordered in ED Medications  oxyCODONE-acetaminophen (PERCOCET/ROXICET) 5-325 MG per tablet 1 tablet (1 tablet Oral Given 12/15/21 1931)  iohexol (OMNIPAQUE) 350 MG/ML injection 75 mL (75 mLs Intravenous Contrast Given 12/15/21 2159)  ketorolac (TORADOL) 30 MG/ML injection 30 mg (30 mg Intravenous Given 12/15/21 2247)    ED Course/ Medical Decision Making/ A&P    31 year old here for evaluation of right-sided abdominal pain, epigastric pain which began approximately 1 month ago.  Worse with p.o. intake.  Had ultrasound which was negative for stones, infection.  She still continues to have pain.  Taking Tylenol at home.  No chronic NSAID use, EtOH use.  No bloody stool.  Not been followed by gastroenterology.  No chest pain, shortness of breath.  She is PERC negative, Wells criteria low risk.  Has noted since she started a new birth control over the last week she has had some persistent vaginal bleeding over the last 10 days and her hair is falling out.  No history of thyroid disorders.  No urinary symptoms, bowel changes.  She has PCP and OB/GYN.  No lightheadedness or dizziness.  No large amount of vaginal bleeding.  Does have some diffuse tenderness to right upper quadrant, epigastric, periumbilical region.  Labs and imaging personally viewed and interpreted:  CBC without leukocytosis CMP without significant abnormality  UA neg for infection, blood present> on menstrual cycle Preg neg Lipase 38 [place] neg for cholecystitis, choledocholithiasis, cholelithiasis  Patient reassessed.  Discussed her labs and imaging.  We will plan on CT scan.  Patient reassessed.  Being given IV pain medication.  Tolerates p.o. intake.  I discussed her CT scan.  No significant findings.  We will start on PPI, Carafate.  Placed referral for GI.  Suspect she would likely benefit from HIDA scan given her chronic right upper quadrant epigastric pain.  Patient without any  chest pain, shortness of breath, URI symptoms to suggest acute intrathoracic etiology of her symptoms such as pneumonia, PE.  No indication of appendicitis, bowel obstruction, bowel perforation, cholecystitis, diverticulitis, AAA, dissection.  The patient has been appropriately medically screened and/or stabilized in the ED. I have low suspicion for any other emergent medical condition which would require further screening, evaluation or treatment in the ED or require inpatient management.  Patient is hemodynamically stable and in no acute distress.  Patient able to ambulate in department prior to ED.  Evaluation does not show acute pathology that would require ongoing or additional emergent interventions while in the emergency department or further inpatient treatment.  I have discussed the diagnosis with the patient and answered all questions.  Pain is been managed while in the emergency department and patient has no further complaints prior to discharge.  Patient is comfortable  with plan discussed in room and is stable for discharge at this time.  I have discussed strict return precautions for returning to the emergency department.  Patient was encouraged to follow-up with PCP/specialist refer to at discharge.                             Medical Decision Making Amount and/or Complexity of Data Reviewed External Data Reviewed: labs, radiology and notes. Labs: ordered. Decision-making details documented in ED Course. Radiology: ordered and independent interpretation performed. Decision-making details documented in ED Course.  Risk OTC drugs. Prescription drug management. Parenteral controlled substances. Decision regarding hospitalization. Diagnosis or treatment significantly limited by social determinants of health.         Final Clinical Impression(s) / ED Diagnoses Final diagnoses:  Right upper quadrant abdominal pain    Rx / DC Orders ED Discharge Orders          Ordered     Ambulatory referral to Gastroenterology        12/15/21 2248    famotidine (PEPCID) 20 MG tablet  2 times daily        12/15/21 2249    sucralfate (CARAFATE) 1 g tablet  3 times daily with meals & bedtime        12/15/21 2249              Ninette Cotta A, PA-C 12/15/21 2256    Vanetta Mulders, MD 12/17/21 0002

## 2021-12-15 NOTE — ED Notes (Signed)
  Pt transported to ct 

## 2021-12-16 ENCOUNTER — Institutional Professional Consult (permissible substitution): Payer: Self-pay | Admitting: Licensed Clinical Social Worker

## 2021-12-16 ENCOUNTER — Encounter: Payer: Self-pay | Admitting: Student

## 2021-12-16 ENCOUNTER — Ambulatory Visit (INDEPENDENT_AMBULATORY_CARE_PROVIDER_SITE_OTHER): Payer: BC Managed Care – PPO | Admitting: Student

## 2021-12-16 ENCOUNTER — Encounter: Payer: Self-pay | Admitting: Obstetrics

## 2021-12-16 ENCOUNTER — Other Ambulatory Visit (HOSPITAL_COMMUNITY)
Admission: RE | Admit: 2021-12-16 | Discharge: 2021-12-16 | Disposition: A | Discharge status: Home / Self Care | Payer: BC Managed Care – PPO | Source: Ambulatory Visit | Attending: Obstetrics | Admitting: Obstetrics

## 2021-12-16 VITALS — BP 119/70 | HR 82 | Wt 194.4 lb

## 2021-12-16 DIAGNOSIS — L659 Nonscarring hair loss, unspecified: Secondary | ICD-10-CM

## 2021-12-16 DIAGNOSIS — N921 Excessive and frequent menstruation with irregular cycle: Secondary | ICD-10-CM | POA: Diagnosis not present

## 2021-12-16 DIAGNOSIS — F418 Other specified anxiety disorders: Secondary | ICD-10-CM | POA: Diagnosis not present

## 2021-12-16 DIAGNOSIS — R5383 Other fatigue: Secondary | ICD-10-CM

## 2021-12-16 MED ORDER — FERROUS SULFATE 325 (65 FE) MG PO TBEC: 325.0000 mg | DELAYED_RELEASE_TABLET | ORAL | 1 refills | Status: DC

## 2021-12-16 MED ORDER — NORETHIN-ETH ESTRAD-FE BIPHAS 1 MG-10 MCG / 10 MCG PO TABS: 1.0000 | ORAL_TABLET | Freq: Every day | ORAL | 0 refills | Status: DC

## 2021-12-16 NOTE — Progress Notes (Unsigned)
Pt presents for follow up after u/s RUQ and excessive vaginal bleeding with Depo. Initial Depo administered 11/09/21

## 2021-12-16 NOTE — Progress Notes (Signed)
History:  Ms. Sierra Evans is a 31 y.o. [id number] who presents to clinic today for breakthrough bleeding since receiving first dose of depo provera. Reports bleeding for ~3 weeks. Has noticed dime sized clots and is changing her pads every 2 hours.    The following portions of the patient's history were reviewed and updated as appropriate: allergies, current medications, family history, past medical history, social history, past surgical history and problem list.  Review of Systems:  ROS    Objective:  Physical Exam BP 119/70   Pulse 82   Wt 194 lb 6.4 oz (88.2 kg)   LMP 11/03/2021   Breastfeeding Yes   BMI 35.56 kg/m  Physical Exam    Labs and Imaging No results found for this or any previous visit (from the past 24 hour(s)).  CT ABDOMEN PELVIS W CONTRAST  Result Date: 12/15/2021 CLINICAL DATA:  Abdominal pain, acute, nonlocalized diffuse right abd pain. Five months post partum EXAM: CT ABDOMEN AND PELVIS WITH CONTRAST TECHNIQUE: Multidetector CT imaging of the abdomen and pelvis was performed using the standard protocol following bolus administration of intravenous contrast. RADIATION DOSE REDUCTION: This exam was performed according to the departmental dose-optimization program which includes automated exposure control, adjustment of the mA and/or kV according to patient size and/or use of iterative reconstruction technique. CONTRAST:  71mL OMNIPAQUE IOHEXOL 350 MG/ML SOLN COMPARISON:  None Available. FINDINGS: Lower chest: No acute abnormality. Hepatobiliary: No focal liver abnormality. No gallstones, gallbladder wall thickening, or pericholecystic fluid. No biliary dilatation. Pancreas: No focal lesion. Normal pancreatic contour. No surrounding inflammatory changes. No main pancreatic ductal dilatation. Spleen: Normal in size without focal abnormality. Adrenals/Urinary Tract: No adrenal nodule bilaterally. Bilateral kidneys enhance symmetrically. No hydronephrosis. No  hydroureter. The urinary bladder is decompressed and grossly unremarkable. Stomach/Bowel: Stomach is within normal limits. No evidence of bowel wall thickening or dilatation. Stool throughout the majority of the colon. Appendix appears normal. Vascular/Lymphatic: No abdominal aorta or iliac aneurysm. Mild atherosclerotic plaque of the aorta and its branches. No abdominal, pelvic, or inguinal lymphadenopathy. Reproductive: Uterus and bilateral adnexa are unremarkable. Other: CT body other Musculoskeletal: No abdominal wall hernia or abnormality.  Diastasis rectus. No suspicious lytic or blastic osseous lesions. No acute displaced fracture. IMPRESSION: 1. No acute intra-abdominal or intrapelvic abnormality 2. Stool throughout the majority of the colon-correlate for constipation. Electronically Signed   By: Tish Frederickson M.D.   On: 12/15/2021 22:07    Health Maintenance Due  Topic Date Due   COVID-19 Vaccine (1) Never done   INFLUENZA VACCINE  08/16/2021    Labs, imaging and previous visits in Epic and Care Everywhere reviewed  Assessment & Plan:  1. Breakthrough bleeding on depo provera - Recent abdominal pelvic CT, normal. Although highly unlikely to be source of bleeding, self-swab collected to r/o infectious process. Will also collect TSH for reassurance due to other presenting symptoms today. Discussed the option for starting COC to regulate bleeding while initiating Depo. Due to being 6 months postpartum and currently breastfeeding, counseled patient on risks associated with breastfeeding and use of estrogen.  - Cervicovaginal ancillary only( [place]) - ferrous sulfate 325 (65 FE) MG EC tablet; Take 1 tablet (325 mg total) by mouth every other day.  Dispense: 45 tablet; Refill: 1 - TSH Rfx on Abnormal to Free T4 - Iron, TIBC and Ferritin Panel - Norethindrone-Ethinyl Estradiol-Fe Biphas (LO LOESTRIN FE) 1 MG-10 MCG / 10 MCG tablet; Take 1 tablet by mouth daily.  Dispense: 30 tablet;  Refill:  0  2. Hair loss -  25-30% of total hair loss - TSH Rfx on Abnormal to Free T4  3. Other fatigue - IBC panel - TSH Rfx on Abnormal to Free T4 - Iron, TIBC and Ferritin Panel  4. Situational anxiety *** - Amb ref to Integrated Behavioral Health  Plan for follow-up in 1 month, or sooner if symptoms worsen.  Approximately 30 minutes of total time was spent with this patient on ***  Corlis Hove, NP 12/16/2021 6:36 PM

## 2021-12-17 LAB — IRON,TIBC AND FERRITIN PANEL
Ferritin: 73 ng/mL (ref 15–150)
Iron Saturation: 8 % — CL (ref 15–55)
Iron: 28 ug/dL (ref 27–159)
Total Iron Binding Capacity: 369 ug/dL (ref 250–450)
UIBC: 341 ug/dL (ref 131–425)

## 2021-12-17 LAB — TSH RFX ON ABNORMAL TO FREE T4: TSH: 1.39 u[IU]/mL (ref 0.450–4.500)

## 2021-12-19 ENCOUNTER — Other Ambulatory Visit: Payer: Self-pay | Admitting: Student

## 2021-12-19 ENCOUNTER — Telehealth: Payer: Self-pay | Admitting: Pharmacy Technician

## 2021-12-19 ENCOUNTER — Encounter: Payer: Self-pay | Admitting: Student

## 2021-12-19 DIAGNOSIS — N760 Acute vaginitis: Secondary | ICD-10-CM

## 2021-12-19 DIAGNOSIS — L659 Nonscarring hair loss, unspecified: Secondary | ICD-10-CM

## 2021-12-19 DIAGNOSIS — R5383 Other fatigue: Secondary | ICD-10-CM

## 2021-12-19 DIAGNOSIS — E611 Iron deficiency: Secondary | ICD-10-CM | POA: Insufficient documentation

## 2021-12-19 DIAGNOSIS — N939 Abnormal uterine and vaginal bleeding, unspecified: Secondary | ICD-10-CM

## 2021-12-19 LAB — CERVICOVAGINAL ANCILLARY ONLY
Bacterial Vaginitis (gardnerella): POSITIVE — AB
Candida Glabrata: NEGATIVE
Candida Vaginitis: NEGATIVE
Chlamydia: NEGATIVE
Comment: NEGATIVE
Comment: NEGATIVE
Comment: NEGATIVE
Comment: NEGATIVE
Comment: NEGATIVE
Comment: NORMAL
Neisseria Gonorrhea: NEGATIVE
Trichomonas: NEGATIVE

## 2021-12-19 MED ORDER — METRONIDAZOLE 500 MG PO TABS: 500.0000 mg | ORAL_TABLET | Freq: Two times a day (BID) | ORAL | 0 refills | Status: DC

## 2021-12-19 NOTE — Telephone Encounter (Signed)
FYI NOTE:   Auth Submission: NO AUTH NEEDED Payer: BCBS Medication & CPT/J Code(s) submitted: [place] (Iron Sucrose) [id number] Route of submission (phone, fax, portal):  Phone # Fax # Auth type: Buy/Bill Units/visits requested: X2 Reference number:  Approval from: 12/19/21 to 01/15/22   Patient will be scheduled as soon as possible

## 2021-12-26 ENCOUNTER — Other Ambulatory Visit: Payer: Self-pay | Admitting: Obstetrics and Gynecology

## 2021-12-26 ENCOUNTER — Ambulatory Visit (INDEPENDENT_AMBULATORY_CARE_PROVIDER_SITE_OTHER): Payer: BC Managed Care – PPO | Admitting: Licensed Clinical Social Worker

## 2021-12-26 ENCOUNTER — Encounter: Payer: Self-pay | Admitting: Student

## 2021-12-26 DIAGNOSIS — F4321 Adjustment disorder with depressed mood: Secondary | ICD-10-CM

## 2021-12-26 DIAGNOSIS — Z634 Disappearance and death of family member: Secondary | ICD-10-CM

## 2021-12-26 MED ORDER — HYDROXYZINE PAMOATE 25 MG PO CAPS: 25.0000 mg | ORAL_CAPSULE | Freq: Three times a day (TID) | ORAL | 1 refills | Status: DC | PRN

## 2021-12-27 NOTE — BH Specialist Note (Signed)
Integrated Behavioral Health via Telemedicine Visit  12/27/2021 Sierra Evans [id number]  Number of Integrated Behavioral Health Clinician visits: 2 Session Start time: 2:00pm  Session End time: 2:15pm Total time in minutes: 15 mins via phone   Referring Provider: n/a Patient/Family location: Home  [facility] Provider location: [place] All persons participating in visit: Sierra Evans and LCSW Sierra Evans  Types of Service: Telephone visit and General Behavioral Integrated Care (BHI)  I connected with Sierra Evans and/or Sierra Evans's n/a via  Telephone or Engineer, civil (consulting)  (Video is Surveyor, mining) and verified that I am speaking with the correct person using two identifiers. Discussed confidentiality: Yes   I discussed the limitations of telemedicine and the availability of in person appointments.  Discussed there is a possibility of technology failure and discussed alternative modes of communication if that failure occurs.  I discussed that engaging in this telemedicine visit, they consent to the provision of behavioral healthcare and the services will be billed under their insurance.  Patient and/or legal guardian expressed understanding and consented to Telemedicine visit: Yes   Presenting Concerns: Patient and/or family reports the following symptoms/concerns: Loss of Child  Duration of problem: One year ; Severity of problem: mild  Patient and/or Family's Strengths/Protective Factors: Concrete supports in place (healthy food, safe environments, etc.)  Goals Addressed: Patient will:  Reduce symptoms of: Grief    Increase knowledge and/or ability of: Grief support    Demonstrate ability to: Begin healthy grieving over loss  Progress towards Goals: Ongoing  Interventions: Interventions utilized:  Supportive Counseling Standardized Assessments completed: Not Needed  Patient and/or Family Response: LCSW Sierra Evans called  Sierra Evans to offer support during the loss of infant son. Sierra Evans reports her partner, family and Sierra Evans are supportive. Sierra Evans reports mood is stable for now however due to circumstances moments of sadness and depression.    Assessment: Patient currently experiencing grief associated w loss of child .   Patient may benefit from integrated behavioral health.  Plan: Follow up with behavioral health clinician on : as needed  Behavioral recommendations: n/a Referral(s): Integrated [employer] (In Clinic)  I discussed the assessment and treatment plan with the patient and/or parent/guardian. They were provided an opportunity to ask questions and all were answered. They agreed with the plan and demonstrated an understanding of the instructions.   They were advised to call back or seek an in-person evaluation if the symptoms worsen or if the condition fails to improve as anticipated.  Gwyndolyn Saxon, LCSW

## 2021-12-29 ENCOUNTER — Ambulatory Visit: Payer: BC Managed Care – PPO

## 2022-01-03 ENCOUNTER — Other Ambulatory Visit: Payer: Self-pay | Admitting: Obstetrics and Gynecology

## 2022-01-03 DIAGNOSIS — N939 Abnormal uterine and vaginal bleeding, unspecified: Secondary | ICD-10-CM

## 2022-01-03 MED ORDER — MEGESTROL ACETATE 40 MG PO TABS: 40.0000 mg | ORAL_TABLET | Freq: Two times a day (BID) | ORAL | 5 refills | Status: AC

## 2022-01-12 ENCOUNTER — Other Ambulatory Visit: Payer: Self-pay

## 2022-01-12 ENCOUNTER — Emergency Department (HOSPITAL_BASED_OUTPATIENT_CLINIC_OR_DEPARTMENT_OTHER)
Admission: EM | Admit: 2022-01-12 | Discharge: 2022-01-12 | Disposition: A | Discharge status: Home / Self Care | Payer: BC Managed Care – PPO | Attending: Emergency Medicine | Admitting: Emergency Medicine

## 2022-01-12 ENCOUNTER — Telehealth: Payer: Self-pay | Admitting: Emergency Medicine

## 2022-01-12 DIAGNOSIS — J45909 Unspecified asthma, uncomplicated: Secondary | ICD-10-CM | POA: Diagnosis not present

## 2022-01-12 DIAGNOSIS — R42 Dizziness and giddiness: Secondary | ICD-10-CM | POA: Insufficient documentation

## 2022-01-12 DIAGNOSIS — R1011 Right upper quadrant pain: Secondary | ICD-10-CM | POA: Insufficient documentation

## 2022-01-12 DIAGNOSIS — N939 Abnormal uterine and vaginal bleeding, unspecified: Secondary | ICD-10-CM | POA: Diagnosis not present

## 2022-01-12 LAB — CBC
HCT: 39 % (ref 36.0–46.0)
Hemoglobin: 12.6 g/dL (ref 12.0–15.0)
MCH: 29.4 pg (ref 26.0–34.0)
MCHC: 32.3 g/dL (ref 30.0–36.0)
MCV: 90.9 fL (ref 80.0–100.0)
Platelets: 332 10*3/uL (ref 150–400)
RBC: 4.29 MIL/uL (ref 3.87–5.11)
RDW: 13.4 % (ref 11.5–15.5)
WBC: 5.6 10*3/uL (ref 4.0–10.5)
nRBC: 0 % (ref 0.0–0.2)

## 2022-01-12 LAB — HCG, SERUM, QUALITATIVE: Preg, Serum: NEGATIVE

## 2022-01-12 LAB — COMPREHENSIVE METABOLIC PANEL
ALT: 13 U/L (ref 0–44)
AST: 14 U/L — ABNORMAL LOW (ref 15–41)
Albumin: 4.4 g/dL (ref 3.5–5.0)
Alkaline Phosphatase: 69 U/L (ref 38–126)
Anion gap: 10 (ref 5–15)
BUN: 12 mg/dL (ref 6–20)
CO2: 24 mmol/L (ref 22–32)
Calcium: 9.2 mg/dL (ref 8.9–10.3)
Chloride: 108 mmol/L (ref 98–111)
Creatinine, Ser: 0.79 mg/dL (ref 0.44–1.00)
GFR, Estimated: >60 mL/min (ref >60)
Glucose, Bld: 117 mg/dL — ABNORMAL HIGH (ref 70–99)
Potassium: 3.7 mmol/L (ref 3.5–5.1)
Sodium: 142 mmol/L (ref 135–145)
Total Bilirubin: 0.4 mg/dL (ref 0.3–1.2)
Total Protein: 7.6 g/dL (ref 6.5–8.1)

## 2022-01-12 LAB — LIPASE, BLOOD: Lipase: 26 U/L (ref 11–51)

## 2022-01-12 MED ORDER — SODIUM CHLORIDE 0.9 % IV BOLUS
1000.0000 mL | Freq: Once | INTRAVENOUS | Status: AC
  Administered 2022-01-12: 1000 mL via INTRAVENOUS

## 2022-01-12 NOTE — Discharge Instructions (Signed)
It was a pleasure taking care of you today.  As discussed, all of your labs are reassuring.  Your hemoglobin was normal.  Report to your iron infusion tomorrow.  Follow-up with OB/GYN for further evaluation of your right upper quadrant abdominal pain.  Return to the ER for any worsening symptoms.

## 2022-01-12 NOTE — ED Notes (Signed)
Pt verbalized understanding of d/c instructions, meds, and followup care. Denies questions. VSS, no distress noted. Steady gait to exit with all belongings.  ?

## 2022-01-12 NOTE — ED Provider Notes (Signed)
[facility] EMERGENCY DEPT Provider Note   CSN: [id number] Arrival date & time: 01/12/22  1033     History  Chief Complaint  Patient presents with   Vaginal Bleeding    Sierra Evans is a 31 y.o. female with a past medical history significant for iron deficiency anemia, Chronic gastritis, chronic back pain, and asthma who presents to the ED due to lightheadedness.  Patient has had persistent vaginal bleeding since 11/19 which stopped 2 days ago after starting Megace from OB/GYN.  Patient states she has been worked up thoroughly for this vaginal bleeding by OB/GYN.  She also endorses persistent right upper quadrant and right-sided back pain for the past month which has not changed in nature.  Patient is waiting an MRI to be scheduled by her OB/GYN for further evaluation of right upper quadrant pain.  Patient has had an ultrasound and CT scan which was negative for any acute abnormalities per patient.  Patient denies nausea and vomiting.  No chest pain or shortness of breath.  No history of blood clots.  Denies lower extremity edema.  Patient states she was advised by her OB/GYN to report to the ED to check her hemoglobin to rule out emergent need for blood transfusion.  Patient has a scheduled iron infusion tomorrow.  History obtained from patient and past medical records. No interpreter used during encounter.       Home Medications Prior to Admission medications   Medication Sig Start Date End Date Taking? Authorizing Provider  ferrous sulfate 325 (65 FE) MG EC tablet Take 1 tablet (325 mg total) by mouth every other day. 12/16/21   Corlis Hove, NP  hydrOXYzine (VISTARIL) 25 MG capsule Take 1 capsule (25 mg total) by mouth 3 (three) times daily as needed for anxiety. 12/26/21   Constant, Peggy, MD  medroxyPROGESTERone (DEPO-PROVERA) 150 MG/ML injection Inject 1 mL (150 mg total) into the muscle every 3 (three) months. 11/09/21   Gerrit Heck, CNM  megestrol  (MEGACE) 40 MG tablet Take 1 tablet (40 mg total) by mouth 2 (two) times daily. Can increase to two tablets twice a day in the event of heavy bleeding 01/03/22   Hermina Staggers, MD  metroNIDAZOLE (FLAGYL) 500 MG tablet Take 1 tablet (500 mg total) by mouth 2 (two) times daily. 12/19/21   Corlis Hove, NP  Prenatal Vit-Fe Fumarate-FA (MULTIVITAMIN-PRENATAL) 27-0.8 MG TABS tablet Take 1 tablet by mouth daily at 12 noon.    [provider]  loratadine (CLARITIN) 10 MG tablet Take by mouth.  07/31/19  [provider]      Allergies    Codeine, Hydrocodone, and Strawberry extract    Review of Systems   Review of Systems  Constitutional:  Negative for chills and fever.  Respiratory:  Negative for shortness of breath.   Cardiovascular:  Negative for chest pain and leg swelling.  Gastrointestinal:  Positive for abdominal pain. Negative for diarrhea, nausea and vomiting.  Genitourinary:  Negative for dysuria and vaginal discharge.  Musculoskeletal:  Positive for back pain.  All other systems reviewed and are negative.   Physical Exam Updated Vital Signs BP 109/66   Pulse (!) 57   Temp 98.6 F (37 C) (Oral)   Resp 18   Ht 5\' 2"  (1.575 m)   Wt 88 kg   LMP 11/03/2021 Comment: depo last normal cycle oct  SpO2 100%   BMI 35.48 kg/m  Physical Exam Vitals and nursing note reviewed.  Constitutional:  General: She is not in acute distress.    Appearance: She is not ill-appearing.  HENT:     Head: Normocephalic.  Eyes:     Pupils: Pupils are equal, round, and reactive to light.  Cardiovascular:     Rate and Rhythm: Normal rate and regular rhythm.     Pulses: Normal pulses.     Heart sounds: Normal heart sounds. No murmur heard.    No friction rub. No gallop.  Pulmonary:     Effort: Pulmonary effort is normal.     Breath sounds: Normal breath sounds.  Abdominal:     General: Abdomen is flat. There is no distension.     Palpations: Abdomen is soft.      Tenderness: There is no abdominal tenderness. There is no guarding or rebound.     Comments: No RUQ tenderness  Musculoskeletal:        General: Normal range of motion.     Cervical back: Neck supple.     Comments: No lower extremity edema  Skin:    General: Skin is warm and dry.  Neurological:     General: No focal deficit present.     Mental Status: She is alert.  Psychiatric:        Mood and Affect: Mood normal.        Behavior: Behavior normal.     ED Results / Procedures / Treatments   Labs (all labs ordered are listed, but only abnormal results are displayed) Labs Reviewed  COMPREHENSIVE METABOLIC PANEL - Abnormal; Notable for the following components:      Result Value   Glucose, Bld 117 (*)    AST 14 (*)    All other components within normal limits  CBC  HCG, SERUM, QUALITATIVE  LIPASE, BLOOD    EKG None  Radiology No results found.  Procedures Procedures    Medications Ordered in ED Medications  sodium chloride 0.9 % bolus 1,000 mL (0 mLs Intravenous Stopped 01/12/22 1516)    ED Course/ Medical Decision Making/ A&P                           Medical Decision Making Amount and/or Complexity of Data Reviewed External Data Reviewed: notes.    Details: OBGYN notes Labs: ordered. Decision-making details documented in ED Course.   This patient presents to the ED for concern of lightheadedness/RUQ pain, this involves an extensive number of treatment options, and is a complaint that carries with it a high risk of complications and morbidity.  The differential diagnosis includes dehydration, symptomatic anemia, acute cholecystitis, pancreatitis, etc  31 year old female presents to the ED due to lightheadedness for the past few days.  Patient was instructed by her OB/GYN to report to the ED to check her hemoglobin to determine need for possible emergent blood transfusion.  Patient states she has had vaginal bleeding since 11/19 which recently stopped 2 days ago  after starting Megace.  She also endorses persistent right upper quadrant abdominal/back pain for the past month with no change in characteristics.  She notes she is awaiting MRI to be ordered by her OB/GYN for further workup.  She has had an unremarkable ultrasound x2 and CT scan. No chest pain or shortness of breath. No history of blood clots. Patient denies current vaginal bleeding.  Patient has a scheduled iron transfusion tomorrow.  Upon arrival, vitals all within normal limits.  Patient in no acute distress.  Reassuring physical exam.  Abdomen soft, nondistended, nontender.  No right upper quadrant tenderness.  Low suspicion for acute cholecystitis. no right CVA tenderness.  No rash to suggest shingles.  No chest pain or shortness of breath.  PERC negative and low risk using Wells criteria.  Low suspicion for PE/DVT.  CBC ordered in triage to rule out anemia.  Added CMP and lipase.  IV fluids given.  Orthostatic vitals obtained while I was in the room during initial evaluation which were normal.  Offered further workup of right upper quadrant abdominal pain however, patient states she is just awaiting her MRI.  She notes her main goal coming to the ED was to rule out need for blood transfusion.  CBC normal.  Normal hemoglobin.  No leukocytosis.  Pregnancy test negative.  3:31 PM reassessed patient at bedside who notes an improvement in symptoms. CMP reassuring.  Normal renal function.  No major electrolyte derangements.  Lipase normal at 26.  Doubt pancreatitis.  Unknown etiology of right upper quadrant pain. Maybe passing gallstones??  Low suspicion for PE/DVT.  Advised patient to schedule her MRI for further evaluation of right upper quadrant abdominal pain. No RUQ tenderness today to suggest acute cholecystitis. No evidence of severe anemia requiring blood transfusion.  Patient stable for discharge.  Advised patient to report to her iron infusion tomorrow.  Follow-up with OB/GYN for further evaluation.  Strict ED precautions discussed with patient. Patient states understanding and agrees to plan. Patient discharged home in no acute distress and stable vitals  Has PCP        Final Clinical Impression(s) / ED Diagnoses Final diagnoses:  Lightheadedness  RUQ abdominal pain    Rx / DC Orders ED Discharge Orders     None         Jesusita Oka 01/12/22 1536    Rondel Baton, MD 01/19/22 1452

## 2022-01-12 NOTE — Telephone Encounter (Signed)
TC from patient reporting dizziness, headache.  States that she has had vaginal bleeding since 11/19 that has improved with megestrol.  Has Iron infusion scheduled tomorrow for anemia.

## 2022-01-12 NOTE — ED Triage Notes (Signed)
Pt states vaginal bleeding since 12/04/21. Was placed on medication to stop bleeding 1 week ago from OBGYN and bleeding stopped 2 days ago. pt was scheduled for Iron infusion 01/13/22. Pt having right sided back and RUQ pain, dizziness and lightheaded today and called OBGYN who advised to come to ER to have blood drawn and possible blood transfusion or iron transfusion.

## 2022-01-13 ENCOUNTER — Ambulatory Visit (INDEPENDENT_AMBULATORY_CARE_PROVIDER_SITE_OTHER): Payer: BC Managed Care – PPO

## 2022-01-13 VITALS — BP 120/77 | HR 58 | Temp 98.5°F | Resp 18 | Ht 62.0 in | Wt 189.8 lb

## 2022-01-13 DIAGNOSIS — K2950 Unspecified chronic gastritis without bleeding: Secondary | ICD-10-CM

## 2022-01-13 DIAGNOSIS — D500 Iron deficiency anemia secondary to blood loss (chronic): Secondary | ICD-10-CM | POA: Diagnosis not present

## 2022-01-13 DIAGNOSIS — N939 Abnormal uterine and vaginal bleeding, unspecified: Secondary | ICD-10-CM

## 2022-01-13 DIAGNOSIS — L659 Nonscarring hair loss, unspecified: Secondary | ICD-10-CM

## 2022-01-13 DIAGNOSIS — E611 Iron deficiency: Secondary | ICD-10-CM

## 2022-01-13 DIAGNOSIS — R5383 Other fatigue: Secondary | ICD-10-CM

## 2022-01-13 MED ORDER — DIPHENHYDRAMINE HCL 25 MG PO CAPS
25.0000 mg | ORAL_CAPSULE | Freq: Once | ORAL | Status: AC
  Administered 2022-01-13: 25 mg via ORAL
  Filled 2022-01-13: qty 1

## 2022-01-13 MED ORDER — ACETAMINOPHEN 325 MG PO TABS
650.0000 mg | ORAL_TABLET | Freq: Once | ORAL | Status: AC
  Administered 2022-01-13: 650 mg via ORAL
  Filled 2022-01-13: qty 2

## 2022-01-13 MED ORDER — SODIUM CHLORIDE 0.9 % IV SOLN
500.0000 mg | Freq: Once | INTRAVENOUS | Status: AC
  Administered 2022-01-13: 500 mg via INTRAVENOUS
  Filled 2022-01-13: qty 25

## 2022-01-13 NOTE — Progress Notes (Cosign Needed)
Diagnosis: Iron Deficiency Anemia  Provider:  Chilton Greathouse MD  Procedure: Infusion  IV Type: Peripheral, IV Location: L Forearm  Venofer (Iron Sucrose), Dose: 500 mg  Infusion Start Time: 0956  Infusion Stop Time: 1427  Post Infusion IV Care: Observation period completed and Peripheral IV Discontinued  Discharge: Condition: Good, Destination: Home . AVS provided to patient.   Performed by:  Nat Math, RN

## 2022-01-23 ENCOUNTER — Encounter: Payer: Self-pay | Admitting: Advanced Practice Midwife

## 2022-01-23 ENCOUNTER — Telehealth (INDEPENDENT_AMBULATORY_CARE_PROVIDER_SITE_OTHER): Payer: Medicaid Other | Admitting: Advanced Practice Midwife

## 2022-01-23 DIAGNOSIS — Z634 Disappearance and death of family member: Secondary | ICD-10-CM

## 2022-01-23 DIAGNOSIS — F418 Other specified anxiety disorders: Secondary | ICD-10-CM | POA: Diagnosis not present

## 2022-01-23 DIAGNOSIS — F4321 Adjustment disorder with depressed mood: Secondary | ICD-10-CM | POA: Diagnosis not present

## 2022-01-23 DIAGNOSIS — O99345 Other mental disorders complicating the puerperium: Secondary | ICD-10-CM

## 2022-01-23 MED ORDER — SERTRALINE HCL 25 MG PO TABS: 25.0000 mg | ORAL_TABLET | Freq: Every day | ORAL | 0 refills | Status: AC

## 2022-01-23 MED ORDER — SERTRALINE HCL 50 MG PO TABS: 50.0000 mg | ORAL_TABLET | Freq: Every day | ORAL | 3 refills | Status: AC

## 2022-01-23 MED ORDER — HYDROXYZINE PAMOATE 25 MG PO CAPS: 25.0000 mg | ORAL_CAPSULE | Freq: Three times a day (TID) | ORAL | 3 refills | Status: AC | PRN

## 2022-01-23 NOTE — Progress Notes (Signed)
GYNECOLOGY VIRTUAL VISIT ENCOUNTER NOTE  Provider location: [facility] for [facility]   Patient location: Home  I connected with Sierra Evans on 01/23/22 at  2:10 PM EST by MyChart Video Encounter and verified that I am speaking with the correct person using two identifiers.   I discussed the limitations, risks, security and privacy concerns of performing an evaluation and management service virtually and the availability of in person appointments. I also discussed with the patient that there may be a patient responsible charge related to this service. The patient expressed understanding and agreed to proceed.   History:  Sierra Evans is a 32 y.o. [phone] female being evaluated today for depression/anxiety and recent loss of her 62 month old baby. She returned to work this week and colleagues were asking questions about her baby, having conversations she could overhear with each other, and she had increased anxiety and difficulty working in this environment.  Her other children are also not in the home currently (they are with her mother) until the investigation is complete about the baby's demise.  She is seeing our behavioral health counselor and is She denies any abnormal vaginal discharge, bleeding, pelvic pain or other concerns.       Past Medical History:  Diagnosis Date   Asthma    GAD (generalized anxiety disorder)    Gestational diabetes    Headache    Scoliosis    Thyroid goiter    UTI (urinary tract infection)    Past Surgical History:  Procedure Laterality Date   cervix scrape     ? D&C "for bleeding"   CESAREAN SECTION     HYSTEROSCOPY  03/2018   The following portions of the patient's history were reviewed and updated as appropriate: allergies, current medications, past family history, past medical history, past social history, past surgical history and problem list.   Health Maintenance:  Normal pap and negative HRHPV on 11/04/20.     Review of Systems:  Pertinent items noted in HPI and remainder of comprehensive ROS otherwise negative.  Physical Exam:   General:  Alert, oriented and cooperative. Patient appears to be in no acute distress.  Mental Status: Normal mood and affect. Normal behavior. Normal judgment and thought content.   Respiratory: Normal respiratory effort, no problems with respiration noted  Rest of physical exam deferred due to type of encounter  Labs and Imaging Results for orders placed or performed during the hospital encounter of 01/12/22 (from the past 336 hour(s))  CBC   Collection Time: 01/12/22 11:12 AM  Result Value Ref Range   WBC 5.6 4.0 - 10.5 K/uL   RBC 4.29 3.87 - 5.11 MIL/uL   Hemoglobin 12.6 12.0 - 15.0 g/dL   HCT 46.6 59.9 - 35.7 %   MCV 90.9 80.0 - 100.0 fL   MCH 29.4 26.0 - 34.0 pg   MCHC 32.3 30.0 - 36.0 g/dL   RDW 01.7 79.3 - 90.3 %   Platelets 332 150 - 400 K/uL   nRBC 0.0 0.0 - 0.2 %  hCG, serum, qualitative   Collection Time: 01/12/22 11:12 AM  Result Value Ref Range   Preg, Serum NEGATIVE NEGATIVE  Comprehensive metabolic panel   Collection Time: 01/12/22 11:12 AM  Result Value Ref Range   Sodium 142 135 - 145 mmol/L   Potassium 3.7 3.5 - 5.1 mmol/L   Chloride 108 98 - 111 mmol/L   CO2 24 22 - 32 mmol/L   Glucose, Bld  117 (H) 70 - 99 mg/dL   BUN 12 6 - 20 mg/dL   Creatinine, Ser 0.79 0.44 - 1.00 mg/dL   Calcium 9.2 8.9 - 10.3 mg/dL   Total Protein 7.6 6.5 - 8.1 g/dL   Albumin 4.4 3.5 - 5.0 g/dL   AST 14 (L) 15 - 41 U/L   ALT 13 0 - 44 U/L   Alkaline Phosphatase 69 38 - 126 U/L   Total Bilirubin 0.4 0.3 - 1.2 mg/dL   GFR, Estimated >60 >60 mL/min   Anion gap 10 5 - 15  Lipase, blood   Collection Time: 01/12/22 11:12 AM  Result Value Ref Range   Lipase 26 11 - 51 U/L   No results found.     Assessment and Plan:     1. Postpartum anxiety --Pt with appropriate anxiety and grief over recent loss. --She returned to work which was difficult in  person with lots of conversations with her and around her in the office. --She worked from home some during her pregnancy and desires to work from home now to transition back to work after this tragedy. --I sent a letter today recommending she work from home starting today, and sent a message to the front desk to adjust her FMLA paperwork accordingly. --Pt taking Vistaril PRN which helps some.  Discussed option to add SSRI for more steady anxiety tx.  Medications take 4-6 weeks to work so renewed Rx for Vistaril as well. --Pt to f/u with [facility] here in our office and front desk to refer pt to grief counseling this week  - sertraline (ZOLOFT) 25 MG tablet; Take 1 tablet (25 mg total) by mouth daily.  Dispense: 7 tablet; Refill: 0 - sertraline (ZOLOFT) 50 MG tablet; Take 1 tablet (50 mg total) by mouth daily.  Dispense: 30 tablet; Refill: 3 - hydrOXYzine (VISTARIL) 25 MG capsule; Take 1 capsule (25 mg total) by mouth 3 (three) times daily as needed for anxiety.  Dispense: 30 capsule; Refill: 3  2. Grief at loss of child        I discussed the assessment and treatment plan with the patient. The patient was provided an opportunity to ask questions and all were answered. The patient agreed with the plan and demonstrated an understanding of the instructions.   The patient was advised to call back or seek an in-person evaluation/go to the ED if the symptoms worsen or if the condition fails to improve as anticipated.  I provided 10 minutes of face-to-face time during this encounter.   Fatima Blank, [place] [facility] [employer], [place]

## 2022-01-23 NOTE — Progress Notes (Signed)
Virtual Visit via Telephone Note  I connected with Sierra Evans on 01/23/22 at  2:10 PM EST by telephone and verified that I am speaking with the correct person using two identifiers.  Location: Patient: home Provider: [facility]  Pt requesting accommodations to work from home due to high anxiety of recent loss.

## 2022-01-25 ENCOUNTER — Encounter: Payer: Self-pay | Admitting: Student

## 2022-01-27 ENCOUNTER — Ambulatory Visit: Payer: Medicaid Other | Admitting: Gastroenterology

## 2022-01-27 ENCOUNTER — Ambulatory Visit: Payer: BC Managed Care – PPO

## 2022-01-27 MED ORDER — ACETAMINOPHEN 325 MG PO TABS: 650.0000 mg | ORAL_TABLET | Freq: Once | ORAL | Status: DC

## 2022-01-27 MED ORDER — SODIUM CHLORIDE 0.9 % IV SOLN
500.0000 mg | Freq: Once | INTRAVENOUS | Status: DC
  Filled 2022-01-27: qty 25

## 2022-01-27 MED ORDER — DIPHENHYDRAMINE HCL 25 MG PO CAPS: 25.0000 mg | ORAL_CAPSULE | Freq: Once | ORAL | Status: DC

## 2022-02-02 ENCOUNTER — Ambulatory Visit: Payer: BC Managed Care – PPO | Admitting: Obstetrics and Gynecology

## 2022-02-02 ENCOUNTER — Ambulatory Visit: Payer: BC Managed Care – PPO

## 2022-02-07 ENCOUNTER — Encounter: Payer: Self-pay | Admitting: Advanced Practice Midwife

## 2022-02-08 ENCOUNTER — Encounter: Payer: Self-pay | Admitting: Obstetrics and Gynecology

## 2022-02-08 ENCOUNTER — Ambulatory Visit (INDEPENDENT_AMBULATORY_CARE_PROVIDER_SITE_OTHER): Payer: Medicaid Other | Admitting: Obstetrics and Gynecology

## 2022-02-08 VITALS — Ht 62.0 in | Wt 181.0 lb

## 2022-02-08 DIAGNOSIS — F4321 Adjustment disorder with depressed mood: Secondary | ICD-10-CM | POA: Diagnosis not present

## 2022-02-08 DIAGNOSIS — Z634 Disappearance and death of family member: Secondary | ICD-10-CM

## 2022-02-08 DIAGNOSIS — N939 Abnormal uterine and vaginal bleeding, unspecified: Secondary | ICD-10-CM

## 2022-02-08 MED ORDER — IBUPROFEN 800 MG PO TABS: 800.0000 mg | ORAL_TABLET | Freq: Three times a day (TID) | ORAL | 3 refills | Status: AC | PRN

## 2022-02-08 MED ORDER — CYCLOBENZAPRINE HCL 10 MG PO TABS: 10.0000 mg | ORAL_TABLET | Freq: Three times a day (TID) | ORAL | 2 refills | Status: AC | PRN

## 2022-02-08 NOTE — Progress Notes (Signed)
Sierra Evans with c/o AUB. Pt had normal cycles Aug - Oct. Started bleeding in Nov, recieced Depo Provera. However bleeding continued Placed on Megace in Dec. Bleeding Stopped until 1-2 weeks ago Now has some light spotting since Taking Megace bid  PE AF VSS Lungs clear Heart RRR Abd soft + BS  A/P AUB  Suspect hormonal and or Depo Provera related Will check labs Continue with Megace for now Check GYN U/S F/U in 3-4 weeks

## 2022-02-08 NOTE — Patient Instructions (Signed)
Abnormal Uterine Bleeding Abnormal uterine bleeding means bleeding more than normal from your womb (uterus). It can include: Bleeding after sex. Bleeding between monthly (menstrual) periods. Bleeding that is heavier than normal. Monthly periods that last longer than normal. Bleeding after you have stopped having your monthly period (menopause). You should see a doctor for any kind of bleeding that is not normal. Treatment depends on the cause of your bleeding and how much you bleed. Follow these instructions at home: Medicines Take over-the-counter and prescription medicines only as told by your doctor. Ask your doctor about: Taking medicines such as aspirin and ibuprofen. Do not take these medicines unless your doctor tells you to take them. Taking over-the-counter medicines, vitamins, herbs, and supplements. You may be given iron pills. Take them as told by your doctor. Managing constipation If you take iron pills, you may need to take these actions to prevent or treat trouble pooping (constipation): Drink enough fluid to keep your pee (urine) pale yellow. Take over-the-counter or prescription medicines. Eat foods that are high in fiber. These include beans, whole grains, and fresh fruits and vegetables. Limit foods that are high in fat and sugar. These include fried or sweet foods. Activity Change your activity to decrease bleeding if you need to change your sanitary pad more than one time every 2 hours: Lie in bed with your feet raised (elevated). Place a cold pack on your lower belly. Rest as much as you are able until the bleeding stops or slows down. General instructions Do not use tampons, douche, or have sex until your doctor says these things are okay. Change your pads often. Get regular exams. These include: Pelvic exams. Screenings for cancer of the cervix. It is up to you to get the results of any tests that are done. Ask how to get your results when they are  ready. Watch for any changes in your bleeding. For 2 months, write down: When your monthly period starts. When your monthly period ends. When you get any abnormal bleeding from your vagina. What problems you notice. Keep all follow-up visits. Contact a doctor if: The bleeding lasts more than one week. You feel dizzy at times. You feel like you may vomit (nausea). You vomit. You feel light-headed or weak. Your symptoms get worse. Get help right away if: You faint. You have to change pads every hour. You have pain in your belly. You have a fever or chills. You get sweaty or weak. You pass large blood clots from your vagina. These symptoms may be an emergency. Get help right away. Call your local emergency services (911 in the [place] Do not wait to see if the symptoms will go away. Do not drive yourself to the hospital. Summary Abnormal uterine bleeding means bleeding more than normal from your womb (uterus). Any kind of bleeding that is not normal should be checked by a doctor. Treatment depends on the cause of your bleeding and how much you bleed. Get help right away if you faint, you have to change pads every hour, or you pass large blood clots from your vagina. This information is not intended to replace advice given to you by your health care provider. Make sure you discuss any questions you have with your health care provider. Document Revised: 05/04/2020 Document Reviewed: 05/04/2020 Elsevier Patient Education  2023 Elsevier Inc.  

## 2022-02-08 NOTE — Progress Notes (Signed)
Continues to have bleeding. Megace improved for a while, then bleeding started back 2 weeks ago. Still taking Megace.

## 2022-02-09 LAB — CBC
Hematocrit: 40.8 % (ref 34.0–46.6)
Hemoglobin: 13.3 g/dL (ref 11.1–15.9)
MCH: 28.9 pg (ref 26.6–33.0)
MCHC: 32.6 g/dL (ref 31.5–35.7)
MCV: 89 fL (ref 79–97)
Platelets: 327 10*3/uL (ref 150–450)
RBC: 4.6 x10E6/uL (ref 3.77–5.28)
RDW: 13.7 % (ref 11.7–15.4)
WBC: 5.1 10*3/uL (ref 3.4–10.8)

## 2022-02-09 LAB — COMPREHENSIVE METABOLIC PANEL
ALT: 14 IU/L (ref 0–32)
AST: 11 IU/L (ref 0–40)
Albumin/Globulin Ratio: 1.5 (ref 1.2–2.2)
Albumin: 4.2 g/dL (ref 3.9–4.9)
Alkaline Phosphatase: 88 IU/L (ref 44–121)
BUN/Creatinine Ratio: 10 (ref 9–23)
BUN: 9 mg/dL (ref 6–20)
Bilirubin Total: 0.4 mg/dL (ref 0.0–1.2)
CO2: 18 mmol/L — ABNORMAL LOW (ref 20–29)
Calcium: 9.2 mg/dL (ref 8.7–10.2)
Chloride: 106 mmol/L (ref 96–106)
Creatinine, Ser: 0.92 mg/dL (ref 0.57–1.00)
Globulin, Total: 2.8 g/dL (ref 1.5–4.5)
Glucose: 102 mg/dL — ABNORMAL HIGH (ref 70–99)
Potassium: 3.8 mmol/L (ref 3.5–5.2)
Sodium: 141 mmol/L (ref 134–144)
Total Protein: 7 g/dL (ref 6.0–8.5)
eGFR: 84 mL/min/{1.73_m2} (ref >59)

## 2022-02-09 LAB — TSH: TSH: 0.655 u[IU]/mL (ref 0.450–4.500)

## 2022-02-17 ENCOUNTER — Encounter: Payer: Medicaid Other | Admitting: Licensed Clinical Social Worker

## 2022-02-19 ENCOUNTER — Ambulatory Visit (HOSPITAL_BASED_OUTPATIENT_CLINIC_OR_DEPARTMENT_OTHER)
Admission: RE | Admit: 2022-02-19 | Discharge: 2022-02-19 | Disposition: A | Discharge status: Home / Self Care | Payer: No Typology Code available for payment source | Source: Ambulatory Visit | Attending: Obstetrics and Gynecology | Admitting: Obstetrics and Gynecology

## 2022-02-19 DIAGNOSIS — N939 Abnormal uterine and vaginal bleeding, unspecified: Secondary | ICD-10-CM | POA: Insufficient documentation

## 2022-02-19 DIAGNOSIS — Z0389 Encounter for observation for other suspected diseases and conditions ruled out: Secondary | ICD-10-CM | POA: Diagnosis not present

## 2022-02-22 ENCOUNTER — Inpatient Hospital Stay: Admission: RE | Admit: 2022-02-22 | Payer: Medicaid Other | Source: Ambulatory Visit

## 2022-03-01 ENCOUNTER — Encounter: Payer: Self-pay | Admitting: Student

## 2022-03-08 ENCOUNTER — Ambulatory Visit: Payer: No Typology Code available for payment source | Admitting: Obstetrics and Gynecology

## 2022-03-08 ENCOUNTER — Encounter: Payer: Self-pay | Admitting: Obstetrics and Gynecology

## 2022-03-08 VITALS — BP 131/84 | HR 87 | Ht 62.0 in | Wt 185.5 lb

## 2022-03-08 DIAGNOSIS — N939 Abnormal uterine and vaginal bleeding, unspecified: Secondary | ICD-10-CM | POA: Diagnosis not present

## 2022-03-08 NOTE — Progress Notes (Signed)
Sierra Evans presents for follow up of her AUB GYN U/S normal and results reviewed with pt Bleeding has stopped Megace bid  Denies any bowel or bladder dysfunction  Not sexual active. Declines contraception  Still grieving from loss of child  Still reports some back and flank pain  PE AF VSS Lungs clear Heart RRR Abd soft + BS   A/P AUB        Grieve Process        Back/Flank pain   Will decrease her Megace to qd til the end of the month and then follow cycles Paper work for work completed for 1 more additional month at home work. Pt instructed that any additional paper work would need to be completed by her PCP or counselor.  To see PCP for continued c/o back.flank pain F/U in 2-3 months

## 2022-03-08 NOTE — Progress Notes (Signed)
F/U US results Would like to continue to work from home for now if possible. Has been doing this because of death of infant. Has difficulty interacting with co workers.

## 2022-03-24 DIAGNOSIS — Z634 Disappearance and death of family member: Secondary | ICD-10-CM | POA: Diagnosis not present

## 2022-03-24 DIAGNOSIS — F4321 Adjustment disorder with depressed mood: Secondary | ICD-10-CM | POA: Diagnosis not present

## 2022-03-24 DIAGNOSIS — M546 Pain in thoracic spine: Secondary | ICD-10-CM | POA: Diagnosis not present

## 2022-03-24 DIAGNOSIS — Z1331 Encounter for screening for depression: Secondary | ICD-10-CM | POA: Diagnosis not present

## 2022-03-24 DIAGNOSIS — J45909 Unspecified asthma, uncomplicated: Secondary | ICD-10-CM | POA: Diagnosis not present

## 2022-04-21 DIAGNOSIS — F4321 Adjustment disorder with depressed mood: Secondary | ICD-10-CM | POA: Diagnosis not present

## 2022-04-21 DIAGNOSIS — Z634 Disappearance and death of family member: Secondary | ICD-10-CM | POA: Diagnosis not present

## 2022-04-21 DIAGNOSIS — M546 Pain in thoracic spine: Secondary | ICD-10-CM | POA: Diagnosis not present

## 2022-04-28 ENCOUNTER — Other Ambulatory Visit: Payer: Self-pay

## 2022-04-28 ENCOUNTER — Ambulatory Visit: Payer: No Typology Code available for payment source | Attending: Physician Assistant

## 2022-04-28 DIAGNOSIS — M546 Pain in thoracic spine: Secondary | ICD-10-CM | POA: Insufficient documentation

## 2022-04-28 DIAGNOSIS — M6281 Muscle weakness (generalized): Secondary | ICD-10-CM | POA: Insufficient documentation

## 2022-04-28 DIAGNOSIS — R293 Abnormal posture: Secondary | ICD-10-CM | POA: Diagnosis present
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
## 2022-05-16 ENCOUNTER — Telehealth: Payer: Self-pay

## 2022-05-16 ENCOUNTER — Ambulatory Visit: Payer: No Typology Code available for payment source

## 2022-05-16 NOTE — Telephone Encounter (Signed)
PT called patient listed number, with the voicemail not matching patient name, therefore did not leave message regarding missed visit.   Eloy End, PT 05/16/22 10:21 AM
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
## 2022-05-19 ENCOUNTER — Telehealth: Payer: Self-pay

## 2022-05-19 ENCOUNTER — Ambulatory Visit: Payer: No Typology Code available for payment source | Attending: Physician Assistant

## 2022-05-19 NOTE — Telephone Encounter (Signed)
TC due to missed visit.  Number rang busy over several attempts. 

## 2022-05-23 ENCOUNTER — Telehealth: Payer: Self-pay

## 2022-05-23 ENCOUNTER — Ambulatory Visit: Payer: No Typology Code available for payment source

## 2022-05-23 NOTE — Telephone Encounter (Signed)
Attempted to call patient over missed appointments but unable to reach or leave voicemail. Patient will be discharged due to attendance policy.   Sierra Evans, PT  05/23/22 8:17 AM

## 2022-05-26 ENCOUNTER — Ambulatory Visit: Payer: No Typology Code available for payment source

## 2022-06-02 ENCOUNTER — Ambulatory Visit: Payer: No Typology Code available for payment source

## 2022-06-06 ENCOUNTER — Ambulatory Visit: Payer: No Typology Code available for payment source

## 2022-06-09 ENCOUNTER — Ambulatory Visit: Payer: No Typology Code available for payment source

## 2022-06-14 DIAGNOSIS — F411 Generalized anxiety disorder: Secondary | ICD-10-CM | POA: Diagnosis not present

## 2022-06-14 DIAGNOSIS — F329 Major depressive disorder, single episode, unspecified: Secondary | ICD-10-CM | POA: Diagnosis not present

## 2022-06-14 DIAGNOSIS — E6609 Other obesity due to excess calories: Secondary | ICD-10-CM | POA: Diagnosis not present

## 2022-06-14 DIAGNOSIS — F32A Depression, unspecified: Secondary | ICD-10-CM | POA: Diagnosis not present

## 2022-08-07 DIAGNOSIS — G44011 Episodic cluster headache, intractable: Secondary | ICD-10-CM | POA: Diagnosis not present

## 2022-08-09 ENCOUNTER — Ambulatory Visit (INDEPENDENT_AMBULATORY_CARE_PROVIDER_SITE_OTHER): Payer: Medicaid Other

## 2022-08-09 ENCOUNTER — Ambulatory Visit
Admission: EM | Admit: 2022-08-09 | Discharge: 2022-08-09 | Disposition: A | Discharge status: Home / Self Care | Payer: Medicaid Other | Attending: Internal Medicine | Admitting: Internal Medicine

## 2022-08-09 DIAGNOSIS — S82892A Other fracture of left lower leg, initial encounter for closed fracture: Secondary | ICD-10-CM | POA: Diagnosis not present

## 2022-08-09 DIAGNOSIS — M25572 Pain in left ankle and joints of left foot: Secondary | ICD-10-CM | POA: Diagnosis not present

## 2022-08-09 DIAGNOSIS — M25472 Effusion, left ankle: Secondary | ICD-10-CM

## 2022-08-09 MED ORDER — ACETAMINOPHEN 325 MG PO TABS
975.0000 mg | ORAL_TABLET | Freq: Once | ORAL | Status: AC
  Administered 2022-08-09: 975 mg via ORAL

## 2022-08-09 MED ORDER — KETOROLAC TROMETHAMINE 30 MG/ML IJ SOLN
30.0000 mg | Freq: Once | INTRAMUSCULAR | Status: AC
  Administered 2022-08-09: 30 mg via INTRAMUSCULAR
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
## 2022-08-09 NOTE — ED Provider Notes (Signed)
[facility]    CSN: [id number] Arrival date & time: 08/09/22  1248      History   Chief Complaint No chief complaint on file.   HPI Sierra Evans is a 32 y.o. female.   Patient presents to urgent care for evaluation of pain and swelling to the left lateral ankle that started approximately 1 week ago but has worsened in the last 24 hours.  She reports previous injury to the left ankle with ankle fracture in 2022 that healed on its own in a cast without surgical repair.  No recent trauma or injuries to the left ankle to her knowledge.  She states she walks a lot for her job and wonders if this could be exacerbating ankle pain.  No numbness or tingling distally to pain.  Denies redness, warmth, and abrasion/laceration to the ankle.  No recent fevers or chills.  She has been taking over-the-counter ibuprofen for pain and swelling without much relief.  Patient recently started her menstrual cycle, denies chance of pregnancy last dose of ibuprofen was last night.     Past Medical History:  Diagnosis Date   Asthma    GAD (generalized anxiety disorder)    Gestational diabetes    Headache    Scoliosis    Thyroid goiter    UTI (urinary tract infection)     Patient Active Problem List   Diagnosis Date Noted   Grief at loss of child 01/23/2022   Postpartum anxiety 01/23/2022   Iron deficiency 12/19/2021   Abnormal uterine and vaginal bleeding, unspecified 12/19/2021   Other fatigue 12/19/2021   Hair loss 12/19/2021   History of C-section 01/05/2021   Chronic gastritis 07/17/2017   Iron deficiency anemia 03/10/2017   Asthma 03/10/2017   Obesity 03/09/2017   Wears eyeglasses 03/09/2017   Scoliosis 03/09/2017   Anemia 03/09/2017   Hirsutism 10/29/2013   Back pain, chronic 10/29/2013    Past Surgical History:  Procedure Laterality Date   cervix scrape     ? D&C "for bleeding"   CESAREAN SECTION     HYSTEROSCOPY  03/2018    OB History     Gravida  4    Para  4   Term  3   Preterm  1   AB  0   Living  4      SAB      IAB      Ectopic  0   Multiple  0   Live Births  4            Home Medications    Prior to Admission medications   Medication Sig Start Date End Date Taking? Authorizing Provider  cyclobenzaprine (FLEXERIL) 10 MG tablet Take 1 tablet (10 mg total) by mouth 3 (three) times daily as needed for muscle spasms. 02/08/22   Hermina Staggers, MD  ferrous sulfate 325 (65 FE) MG EC tablet Take 1 tablet (325 mg total) by mouth every other day. 12/16/21   Corlis Hove, NP  hydrOXYzine (VISTARIL) 25 MG capsule Take 1 capsule (25 mg total) by mouth 3 (three) times daily as needed for anxiety. 01/23/22   Leftwich-Kirby, Wilmer Floor, CNM  ibuprofen (ADVIL) 800 MG tablet Take 1 tablet (800 mg total) by mouth 3 (three) times daily with meals as needed for headache or moderate pain. 02/08/22   Hermina Staggers, MD  megestrol (MEGACE) 40 MG tablet Take 1 tablet (40 mg total) by mouth 2 (two) times daily. Can increase  to two tablets twice a day in the event of heavy bleeding 01/03/22   Hermina Staggers, MD  sertraline (ZOLOFT) 25 MG tablet Take 1 tablet (25 mg total) by mouth daily. 01/23/22   Leftwich-Kirby, Wilmer Floor, CNM  sertraline (ZOLOFT) 50 MG tablet Take 1 tablet (50 mg total) by mouth daily. 01/23/22   Leftwich-Kirby, Wilmer Floor, CNM  loratadine (CLARITIN) 10 MG tablet Take by mouth.  07/31/19  [provider]    Family History Family History  Problem Relation Age of Onset   Alcohol abuse Mother    Cirrhosis Mother    COPD Father    Cancer Brother    Cancer Maternal Grandfather    Stroke Maternal Grandfather    Hypertension Maternal Grandfather    Cancer Paternal Grandmother     Social History Social History   Tobacco Use   Smoking status: Never    Passive exposure: Never   Smokeless tobacco: Never  Vaping Use   Vaping status: Never Used  Substance Use Topics   Alcohol use: No   Drug use: No     Allergies    Codeine, Hydrocodone, and Strawberry extract   Review of Systems Review of Systems Per HPI  Physical Exam Triage Vital Signs ED Triage Vitals [08/09/22 1304]  Encounter Vitals Group     BP (!) 127/92     Systolic BP Percentile      Diastolic BP Percentile      Pulse Rate 69     Resp 20     Temp 97.8 F (36.6 C)     Temp Source Oral     SpO2 98 %     Weight      Height      Head Circumference      Peak Flow      Pain Score 10     Pain Loc      Pain Education      Exclude from Growth Chart    No data found.  Updated Vital Signs BP (!) 127/92 (BP Location: Right Arm)   Pulse 69   Temp 97.8 F (36.6 C) (Oral)   Resp 20   LMP 07/10/2022 (Exact Date) Comment: start  SpO2 98%   Visual Acuity Right Eye Distance:   Left Eye Distance:   Bilateral Distance:    Right Eye Near:   Left Eye Near:    Bilateral Near:     Physical Exam Vitals and nursing note reviewed.  Constitutional:      Appearance: She is not ill-appearing or toxic-appearing.  HENT:     Head: Normocephalic and atraumatic.     Right Ear: Hearing and external ear normal.     Left Ear: Hearing and external ear normal.     Nose: Nose normal.     Mouth/Throat:     Lips: Pink.  Eyes:     General: Lids are normal. Vision grossly intact. Gaze aligned appropriately.     Extraocular Movements: Extraocular movements intact.     Conjunctiva/sclera: Conjunctivae normal.  Pulmonary:     Effort: Pulmonary effort is normal.  Musculoskeletal:     Cervical back: Neck supple.     Left ankle: Swelling (Mild soft tissue swelling to the lateral malleolus, able to visualize bony landmarks) present. No deformity, ecchymosis or lacerations. Tenderness present over the lateral malleolus and base of 5th metatarsal. No medial malleolus, ATF ligament, AITF ligament, CF ligament, posterior TF ligament or proximal fibula tenderness. Decreased range of motion (Secondary  to tenderness with dorsiflexion and plantarflexion).  Anterior drawer test negative. Normal pulse (+2 dorsalis pedis pulses bilaterally).     Left Achilles Tendon: Normal.  Skin:    General: Skin is warm and dry.     Capillary Refill: Capillary refill takes less than 2 seconds.     Findings: No rash.  Neurological:     General: No focal deficit present.     Mental Status: She is alert and oriented to person, place, and time. Mental status is at baseline.     Cranial Nerves: No dysarthria or facial asymmetry.  Psychiatric:        Mood and Affect: Mood normal.        Speech: Speech normal.        Behavior: Behavior normal.        Thought Content: Thought content normal.        Judgment: Judgment normal.      UC Treatments / Results  Labs (all labs ordered are listed, but only abnormal results are displayed) Labs Reviewed - No data to display  EKG   Radiology DG Ankle Complete Left  Result Date: 08/09/2022 CLINICAL DATA:  Previous fracture in 2022, ankle swelling and pain. EXAM: LEFT ANKLE COMPLETE - 3+ VIEW COMPARISON:  None Available. FINDINGS: There is no evidence of fracture, dislocation, or joint effusion. There is no evidence of arthropathy or other focal bone abnormality. Mild soft tissue swelling about the ankle. IMPRESSION: Mild soft tissue swelling about the ankle. No acute fracture or dislocation. Electronically Signed   By: Larose Hires D.O.   On: 08/09/2022 14:36    Procedures Procedures (including critical care time)  Medications Ordered in UC Medications  ketorolac (TORADOL) 30 MG/ML injection 30 mg (30 mg Intramuscular Given 08/09/22 1356)  acetaminophen (TYLENOL) tablet 975 mg (975 mg Oral Given 08/09/22 1355)    Initial Impression / Assessment and Plan / UC Course  I have reviewed the triage vital signs and the nursing notes.  Pertinent labs & imaging results that were available during my care of the patient were reviewed by me and considered in my medical decision making (see chart for details).   1.  Pain  and swelling of left ankle Left ankle x-rays negative for signs of acute fracture/bony abnormality.  CAM Walker boot applied to be worn for support and compression.  Ketorolac 30 mg IM and Tylenol 975 mg given in clinic with significant improvement in pain and swelling prior to discharge from clinic while waiting on x-ray results.  May start ibuprofen 600 mg every 6 hours as needed tomorrow (no NSAIDs for 24 hours due to ketorolac injection).  Neurovascularly intact distally to injury/pain.  May follow-up with [facility] as needed for ongoing evaluation and management of left ankle discomfort.  Counseled patient on potential for adverse effects with medications prescribed/recommended today, strict ER and return-to-clinic precautions discussed, patient verbalized understanding.    Final Clinical Impressions(s) / UC Diagnoses   Final diagnoses:  Pain and swelling of left ankle     Discharge Instructions      Ankle x-ray shows possible changes with arthritis changes.   Wear the CAM walker boot consistently for the next 5-7 days until follow-up appointment with [facility].   Rest, ice, and elevate your ankle.  Schedule an appointment with [facility] for the next 3 to 5 days.  You may take Tylenol and ibuprofen as needed for pain. I gave you a shot  of pain medicine in the clinic which is similar to ibuprofen, therefore do not take any ibuprofen until tomorrow morning.  You may take 600 mg of ibuprofen every 6 hours as needed for pain and inflammation.  If you develop any new or worsening symptoms or if your symptoms do not start to improve, pleases return here or follow-up with your primary care provider. If your symptoms are severe, please go to the emergency room.     ED Prescriptions   None    PDMP not reviewed this encounter.   Carlisle Beers, [place] 08/09/22 1454

## 2022-08-09 NOTE — Discharge Instructions (Signed)
Ankle x-ray shows possible changes with arthritis changes.   Wear the CAM walker boot consistently for the next 5-7 days until follow-up appointment with [facility].   Rest, ice, and elevate your ankle.  Schedule an appointment with [facility] for the next 3 to 5 days.  You may take Tylenol and ibuprofen as needed for pain. I gave you a shot of pain medicine in the clinic which is similar to ibuprofen, therefore do not take any ibuprofen until tomorrow morning.  You may take 600 mg of ibuprofen every 6 hours as needed for pain and inflammation.  If you develop any new or worsening symptoms or if your symptoms do not start to improve, pleases return here or follow-up with your primary care provider. If your symptoms are severe, please go to the emergency room.

## 2022-08-14 DIAGNOSIS — M25572 Pain in left ankle and joints of left foot: Secondary | ICD-10-CM | POA: Diagnosis not present

## 2022-08-17 DIAGNOSIS — M948X9 Other specified disorders of cartilage, unspecified sites: Secondary | ICD-10-CM | POA: Diagnosis not present

## 2022-08-17 DIAGNOSIS — F411 Generalized anxiety disorder: Secondary | ICD-10-CM | POA: Diagnosis not present

## 2022-08-21 ENCOUNTER — Other Ambulatory Visit: Payer: Self-pay | Admitting: Orthopaedic Surgery

## 2022-08-21 DIAGNOSIS — M25572 Pain in left ankle and joints of left foot: Secondary | ICD-10-CM

## 2022-11-07 DIAGNOSIS — Z23 Encounter for immunization: Secondary | ICD-10-CM | POA: Diagnosis not present

## 2022-11-07 DIAGNOSIS — E049 Nontoxic goiter, unspecified: Secondary | ICD-10-CM | POA: Diagnosis not present

## 2022-11-07 DIAGNOSIS — N644 Mastodynia: Secondary | ICD-10-CM | POA: Diagnosis not present

## 2022-11-07 DIAGNOSIS — N6451 Induration of breast: Secondary | ICD-10-CM | POA: Diagnosis not present

## 2022-11-07 DIAGNOSIS — B353 Tinea pedis: Secondary | ICD-10-CM | POA: Diagnosis not present

## 2022-11-07 DIAGNOSIS — E66812 Obesity, class 2: Secondary | ICD-10-CM | POA: Diagnosis not present

## 2022-11-07 DIAGNOSIS — R7301 Impaired fasting glucose: Secondary | ICD-10-CM | POA: Diagnosis not present

## 2022-11-07 DIAGNOSIS — E559 Vitamin D deficiency, unspecified: Secondary | ICD-10-CM | POA: Diagnosis not present

## 2022-11-07 DIAGNOSIS — E282 Polycystic ovarian syndrome: Secondary | ICD-10-CM | POA: Diagnosis not present

## 2022-11-07 DIAGNOSIS — R7303 Prediabetes: Secondary | ICD-10-CM | POA: Diagnosis not present

## 2022-11-10 DIAGNOSIS — R221 Localized swelling, mass and lump, neck: Secondary | ICD-10-CM | POA: Diagnosis not present

## 2022-11-10 DIAGNOSIS — E049 Nontoxic goiter, unspecified: Secondary | ICD-10-CM | POA: Diagnosis not present

## 2022-11-15 DIAGNOSIS — N644 Mastodynia: Secondary | ICD-10-CM | POA: Diagnosis not present

## 2023-02-02 DIAGNOSIS — Z7689 Persons encountering health services in other specified circumstances: Secondary | ICD-10-CM | POA: Diagnosis not present

## 2023-02-02 DIAGNOSIS — E559 Vitamin D deficiency, unspecified: Secondary | ICD-10-CM | POA: Diagnosis not present

## 2023-02-02 DIAGNOSIS — R7301 Impaired fasting glucose: Secondary | ICD-10-CM | POA: Diagnosis not present

## 2023-02-02 DIAGNOSIS — R5382 Chronic fatigue, unspecified: Secondary | ICD-10-CM | POA: Diagnosis not present

## 2023-02-02 DIAGNOSIS — D509 Iron deficiency anemia, unspecified: Secondary | ICD-10-CM | POA: Diagnosis not present

## 2023-02-02 DIAGNOSIS — R7303 Prediabetes: Secondary | ICD-10-CM | POA: Diagnosis not present

## 2023-02-09 DIAGNOSIS — E282 Polycystic ovarian syndrome: Secondary | ICD-10-CM | POA: Diagnosis not present

## 2023-02-09 DIAGNOSIS — R7303 Prediabetes: Secondary | ICD-10-CM | POA: Diagnosis not present

## 2023-02-09 DIAGNOSIS — F32A Depression, unspecified: Secondary | ICD-10-CM | POA: Diagnosis not present

## 2023-02-09 DIAGNOSIS — B353 Tinea pedis: Secondary | ICD-10-CM | POA: Diagnosis not present

## 2023-02-09 DIAGNOSIS — E66812 Obesity, class 2: Secondary | ICD-10-CM | POA: Diagnosis not present

## 2023-03-16 DIAGNOSIS — F3342 Major depressive disorder, recurrent, in full remission: Secondary | ICD-10-CM | POA: Diagnosis not present

## 2023-03-16 DIAGNOSIS — B353 Tinea pedis: Secondary | ICD-10-CM | POA: Diagnosis not present

## 2023-03-16 DIAGNOSIS — E6609 Other obesity due to excess calories: Secondary | ICD-10-CM | POA: Diagnosis not present

## 2023-03-16 DIAGNOSIS — E282 Polycystic ovarian syndrome: Secondary | ICD-10-CM | POA: Diagnosis not present

## 2023-03-16 DIAGNOSIS — R7303 Prediabetes: Secondary | ICD-10-CM | POA: Diagnosis not present

## 2023-03-16 DIAGNOSIS — E66812 Obesity, class 2: Secondary | ICD-10-CM | POA: Diagnosis not present

## 2023-04-03 ENCOUNTER — Ambulatory Visit: Payer: Medicaid Other | Admitting: Obstetrics and Gynecology

## 2023-05-05 IMAGING — US US OB < 14 WEEKS - US OB TV
1 series · 15 of 28 positions shown · non-contrast
Comparison: None.

CLINICAL DATA: Abdominal cramping

EXAM:
OBSTETRIC <14 WK US AND TRANSVAGINAL OB US
TECHNIQUE: Both transabdominal and transvaginal ultrasound examinations were
performed for complete evaluation of the gestation as well as the
maternal uterus, adnexal regions, and pelvic cul-de-sac.
Transvaginal technique was performed to assess early pregnancy.

[Series 1: us ob < 14 weeks - us ob tv · 15 of 52 slices shown]
[im 1/52]
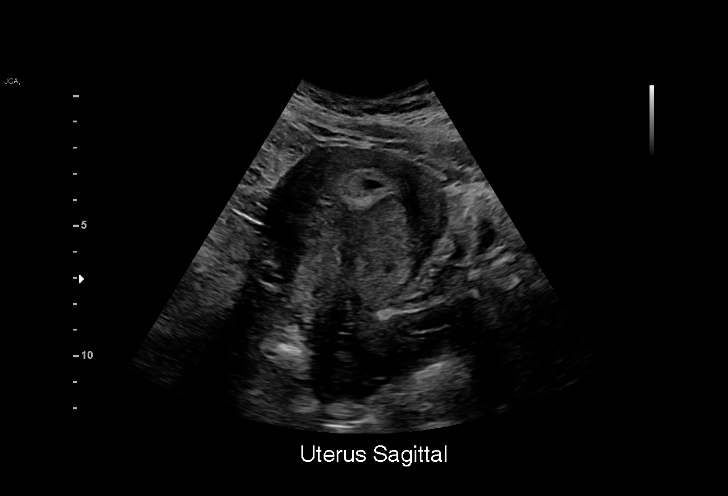
[im 4/52]
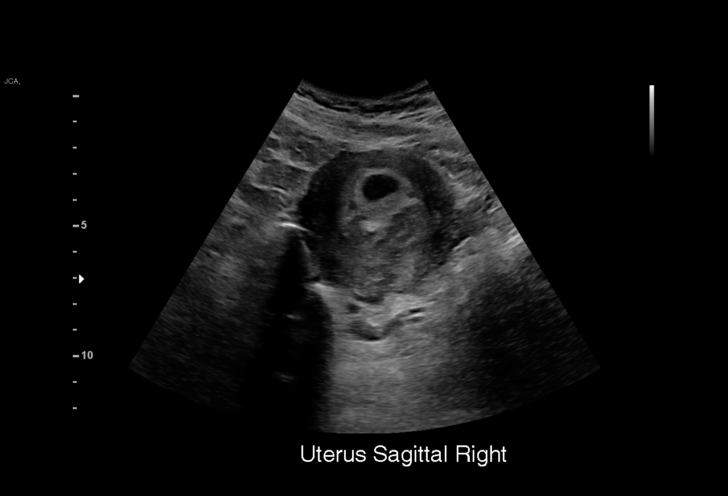
[im 8/52]
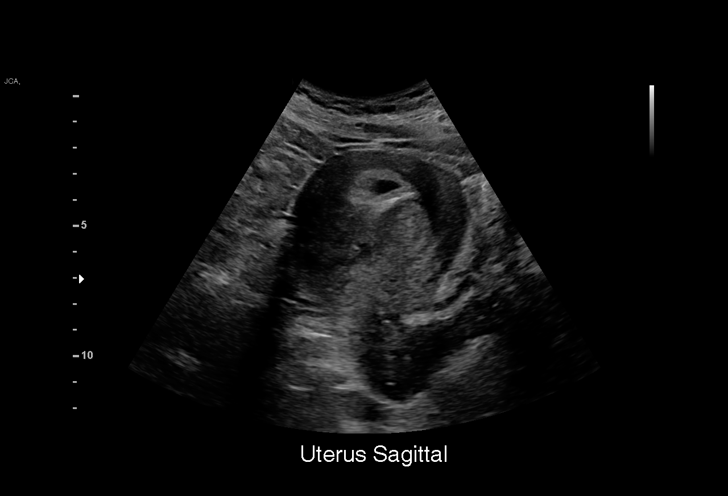
[im 12/52]
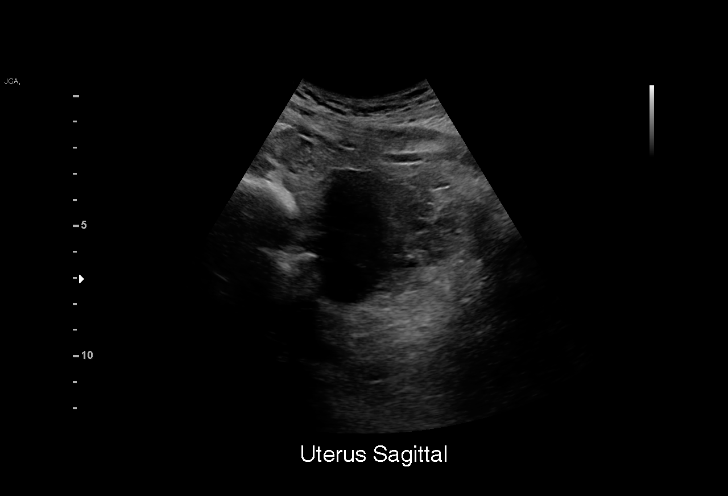
[im 16/52]
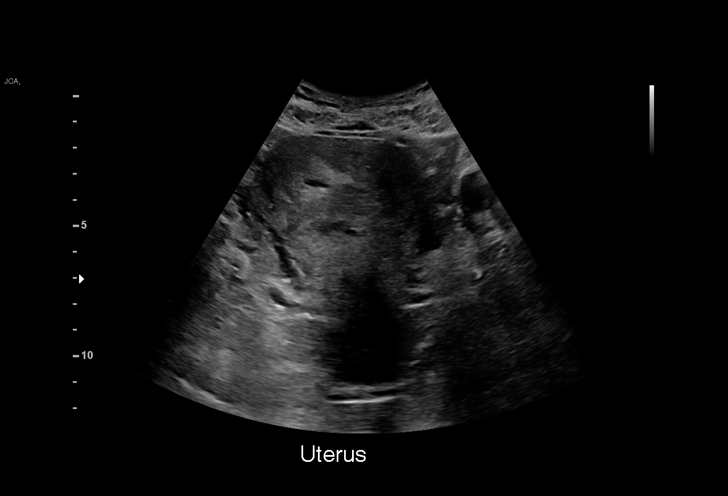
[im 19/52]
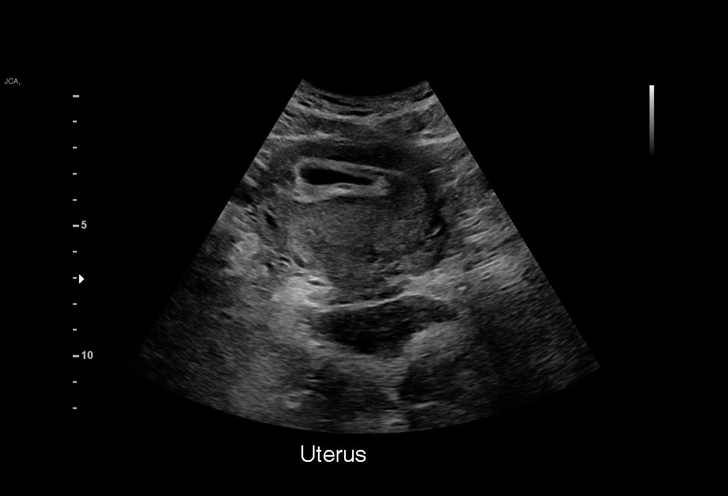
[im 23/52]
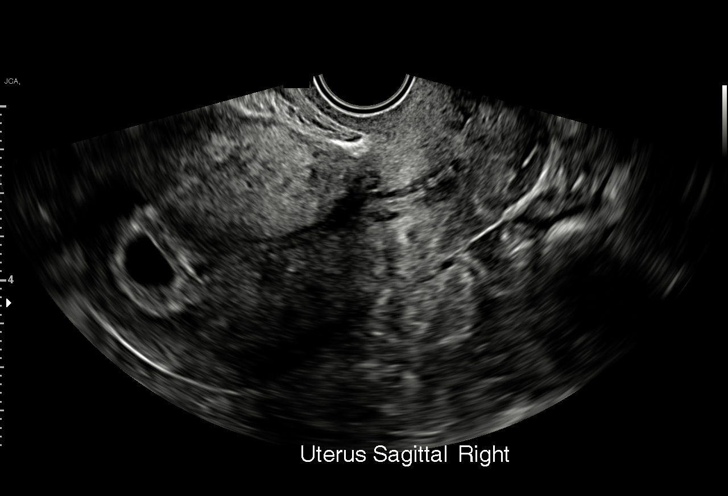
[im 27/52]
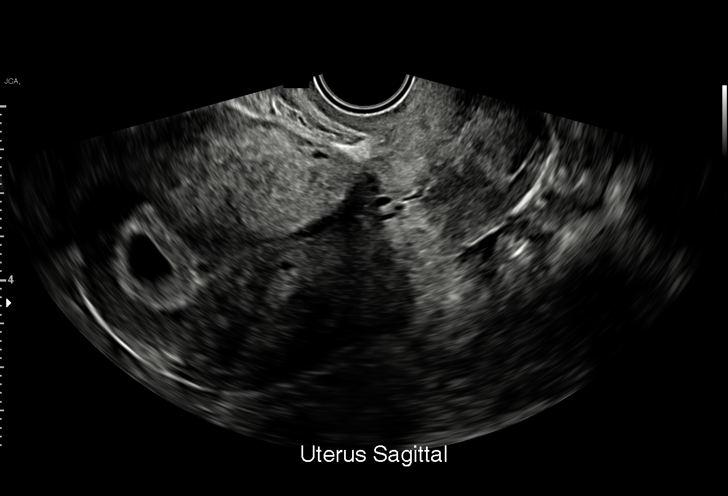
[im 29/52]
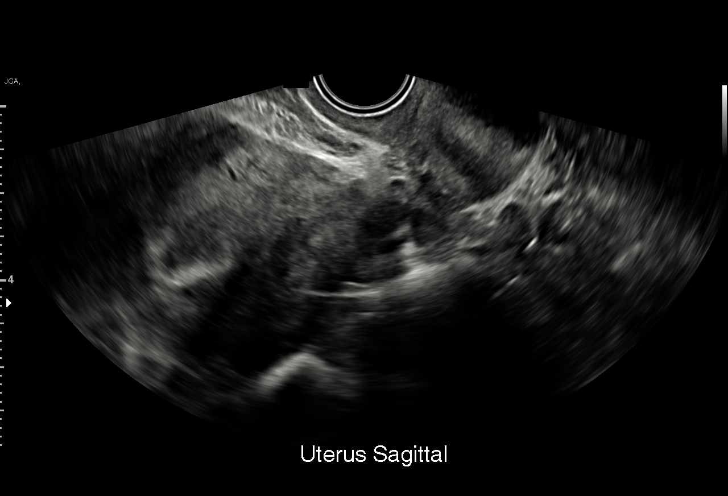
[im 33/52]
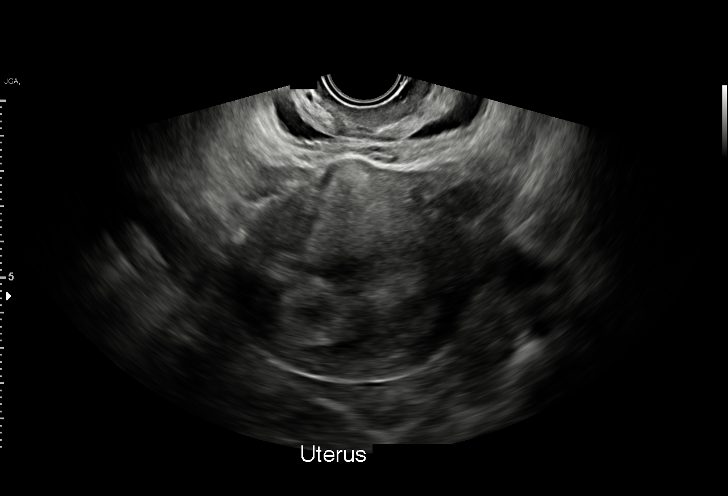
[im 36/52]
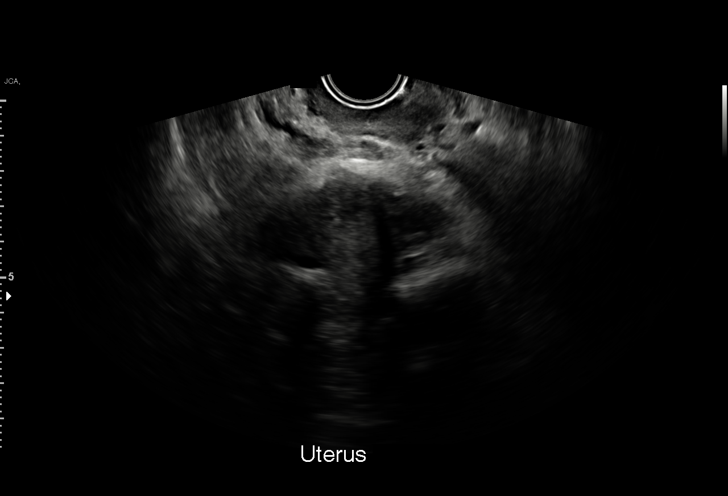
[im 40/52]
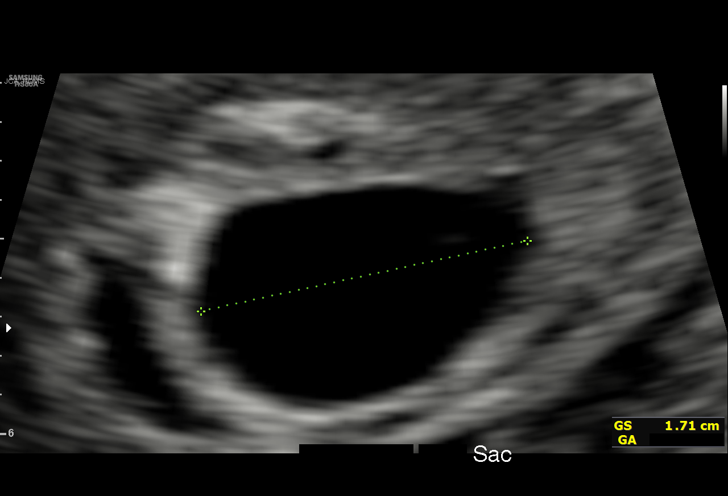
[im 44/52]
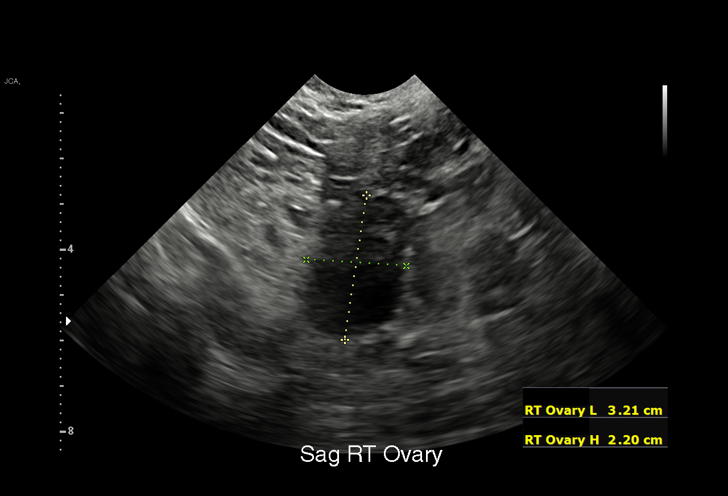
[im 48/52]
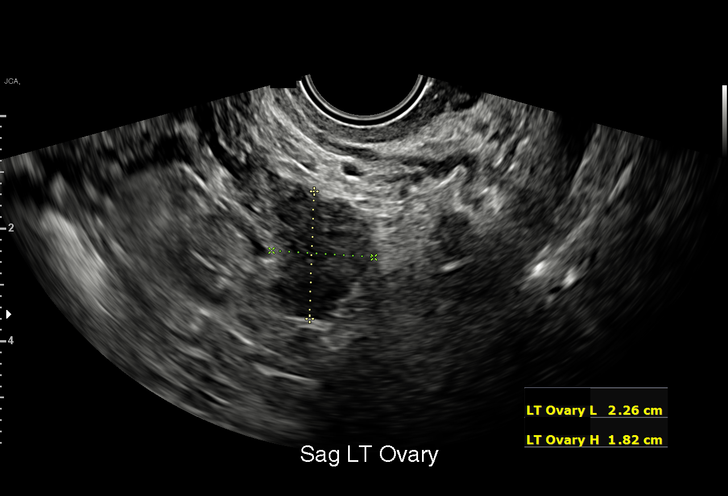
[im 52/52]
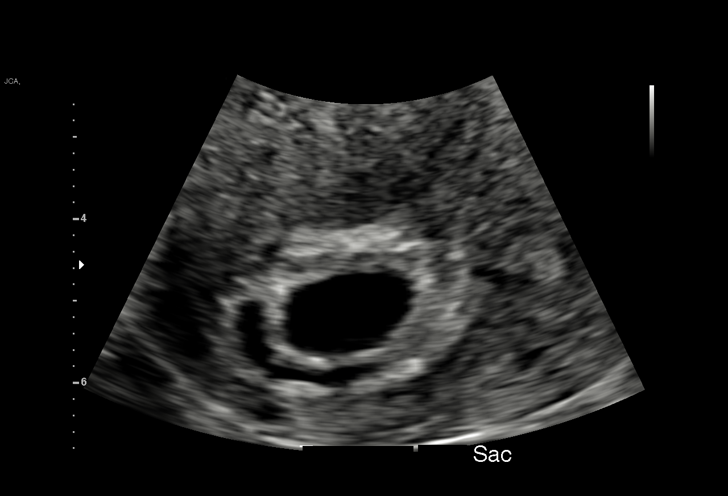

[15 of 28 positions shown; findings below may reference images not displayed]

FINDINGS: Intrauterine gestational sac: Single

Yolk sac:  Not Visualized.

Embryo:  Not definitively visualized.

Cardiac Activity: Not Visualized.

MSD: 14.1 mm   6 w   2 d

Subchorionic hemorrhage:  None visualized.

Maternal uterus/adnexae: Bilateral ovaries within normal limits.
Trace free fluid within the pelvis.
IMPRESSION: Probable early intrauterine gestational sac, but no yolk sac, fetal
pole, or cardiac activity yet visualized. Recommend follow-up
quantitative B-HCG levels and follow-up US in 14 days to assess
viability. This recommendation follows SRU consensus guidelines:
Diagnostic Criteria for Nonviable Pregnancy Early in the First
Trimester. N Engl J Med 0103; [DATE].

## 2023-05-07 ENCOUNTER — Ambulatory Visit: Admitting: Obstetrics and Gynecology

## 2023-05-15 DIAGNOSIS — K5904 Chronic idiopathic constipation: Secondary | ICD-10-CM | POA: Diagnosis not present

## 2023-05-15 DIAGNOSIS — E66812 Obesity, class 2: Secondary | ICD-10-CM | POA: Diagnosis not present

## 2023-05-29 DIAGNOSIS — R300 Dysuria: Secondary | ICD-10-CM | POA: Diagnosis not present

## 2023-05-29 DIAGNOSIS — R1011 Right upper quadrant pain: Secondary | ICD-10-CM | POA: Diagnosis not present

## 2023-06-08 ENCOUNTER — Ambulatory Visit: Admitting: Obstetrics

## 2023-06-08 ENCOUNTER — Encounter: Payer: Self-pay | Admitting: Obstetrics

## 2023-06-08 ENCOUNTER — Other Ambulatory Visit (HOSPITAL_COMMUNITY)
Admission: RE | Admit: 2023-06-08 | Discharge: 2023-06-08 | Disposition: A | Discharge status: Home / Self Care | Source: Ambulatory Visit | Attending: Obstetrics | Admitting: Obstetrics

## 2023-06-08 VITALS — BP 99/67 | HR 66 | Ht 63.0 in | Wt 181.8 lb

## 2023-06-08 DIAGNOSIS — Z1331 Encounter for screening for depression: Secondary | ICD-10-CM | POA: Diagnosis not present

## 2023-06-08 DIAGNOSIS — Z01419 Encounter for gynecological examination (general) (routine) without abnormal findings: Secondary | ICD-10-CM

## 2023-06-08 DIAGNOSIS — B951 Streptococcus, group B, as the cause of diseases classified elsewhere: Secondary | ICD-10-CM | POA: Diagnosis not present

## 2023-06-08 DIAGNOSIS — N898 Other specified noninflammatory disorders of vagina: Secondary | ICD-10-CM | POA: Insufficient documentation

## 2023-06-08 DIAGNOSIS — Z3169 Encounter for other general counseling and advice on procreation: Secondary | ICD-10-CM

## 2023-06-08 DIAGNOSIS — Z113 Encounter for screening for infections with a predominantly sexual mode of transmission: Secondary | ICD-10-CM

## 2023-06-08 DIAGNOSIS — N390 Urinary tract infection, site not specified: Secondary | ICD-10-CM | POA: Diagnosis not present

## 2023-06-08 LAB — POCT URINALYSIS DIPSTICK
Blood, UA: NEGATIVE
Glucose, UA: NEGATIVE
Ketones, UA: NEGATIVE
Nitrite, UA: NEGATIVE
Protein, UA: POSITIVE — AB
Spec Grav, UA: 1.015 (ref 1.010–1.025)
Urobilinogen, UA: 2 U/dL — AB
pH, UA: 6 (ref 5.0–8.0)

## 2023-06-08 MED ORDER — VITAFOL ULTRA 29-0.6-0.4-200 MG PO CAPS: 1.0000 | ORAL_CAPSULE | Freq: Every day | ORAL | 4 refills | Status: DC

## 2023-06-08 MED ORDER — AMOXICILLIN-POT CLAVULANATE 875-125 MG PO TABS: 1.0000 | ORAL_TABLET | Freq: Two times a day (BID) | ORAL | 0 refills | Status: AC

## 2023-06-08 NOTE — Progress Notes (Signed)
 Subjective:        Sierra Evans is a 33 y.o. female here for a routine exam.  Current complaints: Vaginal discharge.  Recent UTI, treated, but still has backache but no dysuria..    Personal health questionnaire:  Is patient Ashkenazi Jewish, have a family history of breast and/or ovarian cancer: no Is there a family history of uterine cancer diagnosed at age < 44, gastrointestinal cancer, urinary tract cancer, family member who is a Personnel officer syndrome-associated carrier: no Is the patient overweight and hypertensive, family history of diabetes, personal history of gestational diabetes, preeclampsia or PCOS: no Is patient over 40, have PCOS,  family history of premature CHD under age 57, diabetes, smoke, have hypertension or peripheral artery disease:  no At any time, has a partner hit, kicked or otherwise hurt or frightened you?: no Over the past 2 weeks, have you felt down, depressed or hopeless?: no Over the past 2 weeks, have you felt little interest or pleasure in doing things?:no   Gynecologic History Patient's last menstrual period was 06/02/2023. Contraception: none Last Pap: 2022. Results were: normal Last mammogram: 2024. Results were: abnormal ( fibrocystic changes )  Obstetric History OB History  Gravida Para Term Preterm AB Living  4 4 3 1  0 4  SAB IAB Ectopic Multiple Live Births    0 0 4    # Outcome Date GA Lbr Len/2nd Weight Sex Type Anes PTL Lv  4 Preterm 06/26/21 [redacted]w[redacted]d 10:07 / 01:16 6 lb 0.7 oz (2.74 kg) M VBAC EPI  LIV  3 Term 02/23/15    F CS-Unspec  N LIV     Complications: Fetal Intolerance  2 Term 04/18/11    M Vag-Spont   LIV  1 Term 04/02/09    Melven Stable Vag-Spont   LIV    Past Medical History:  Diagnosis Date   Asthma    GAD (generalized anxiety disorder)    Gestational diabetes    Headache    Scoliosis    Thyroid  goiter    UTI (urinary tract infection)     Past Surgical History:  Procedure Laterality Date   cervix scrape     ? D&C "for  bleeding"   CESAREAN SECTION     HYSTEROSCOPY  03/2018     Current Outpatient Medications:    amoxicillin -clavulanate (AUGMENTIN ) 875-125 MG tablet, Take 1 tablet by mouth 2 (two) times daily., Disp: 14 tablet, Rfl: 0   ferrous sulfate  325 (65 FE) MG EC tablet, Take 1 tablet (325 mg total) by mouth every other day., Disp: 45 tablet, Rfl: 1   ibuprofen  (ADVIL ) 800 MG tablet, Take 1 tablet (800 mg total) by mouth 3 (three) times daily with meals as needed for headache or moderate pain., Disp: 30 tablet, Rfl: 3   Prenat-Fe Poly-Methfol-FA-DHA (VITAFOL ULTRA) 29-0.6-0.4-200 MG CAPS, Take 1 capsule by mouth daily before breakfast., Disp: 90 capsule, Rfl: 4   sertraline  (ZOLOFT ) 50 MG tablet, Take 1 tablet (50 mg total) by mouth daily., Disp: 30 tablet, Rfl: 3   cyclobenzaprine  (FLEXERIL ) 10 MG tablet, Take 1 tablet (10 mg total) by mouth 3 (three) times daily as needed for muscle spasms. (Patient not taking: Reported on 06/08/2023), Disp: 30 tablet, Rfl: 2   hydrOXYzine  (VISTARIL ) 25 MG capsule, Take 1 capsule (25 mg total) by mouth 3 (three) times daily as needed for anxiety. (Patient not taking: Reported on 06/08/2023), Disp: 30 capsule, Rfl: 3   megestrol  (MEGACE ) 40 MG tablet, Take 1 tablet (40  mg total) by mouth 2 (two) times daily. Can increase to two tablets twice a day in the event of heavy bleeding (Patient not taking: Reported on 06/08/2023), Disp: 60 tablet, Rfl: 5   sertraline  (ZOLOFT ) 25 MG tablet, Take 1 tablet (25 mg total) by mouth daily. (Patient not taking: Reported on 06/08/2023), Disp: 7 tablet, Rfl: 0 Allergies  Allergen Reactions   Codeine Hives   Hydrocodone  Nausea And Vomiting   Strawberry Extract Hives    Social History   Tobacco Use   Smoking status: Never    Passive exposure: Never   Smokeless tobacco: Never  Substance Use Topics   Alcohol use: No    Family History  Problem Relation Age of Onset   Alcohol abuse Mother    Cirrhosis Mother    COPD Father    Cancer  Brother    Cancer Maternal Grandfather    Stroke Maternal Grandfather    Hypertension Maternal Grandfather    Cancer Paternal Grandmother       Review of Systems  Constitutional: negative for fatigue and weight loss Respiratory: negative for cough and wheezing Cardiovascular: negative for chest pain, fatigue and palpitations Gastrointestinal: negative for abdominal pain and change in bowel habits Musculoskeletal:negative for myalgias Neurological: negative for gait problems and tremors Behavioral/Psych: negative for abusive relationship, depression Endocrine: negative for temperature intolerance    Genitourinary: POSITIVE FOR VAGINAL DISCHARGE.  negative for abnormal menstrual periods, genital lesions, hot flashes, sexual problems  Integument/breast: negative for breast lump, breast tenderness, nipple discharge and skin lesion(s)    Objective:       BP 99/67   Pulse 66   Ht 5\' 3"  (1.6 m)   Wt 181 lb 12.8 oz (82.5 kg)   LMP 06/02/2023   BMI 32.20 kg/m  General:   Alert and no distress  Skin:   no rash or abnormalities  Lungs:   clear to auscultation bilaterally  Heart:   regular rate and rhythm, S1, S2 normal, no murmur, click, rub or gallop  Breasts:   normal without suspicious masses, skin or nipple changes or axillary nodes  Abdomen:  normal findings: no organomegaly, soft, non-tender and no hernia  Pelvis:  External genitalia: normal general appearance Urinary system: urethral meatus normal and bladder without fullness, nontender Vaginal: normal without tenderness, induration or masses Cervix: normal appearance Adnexa: normal bimanual exam Uterus: anteverted and non-tender, normal size   Lab Review Urine pregnancy test Labs reviewed yes Radiologic studies reviewed no  I have spent a total of 20 minutes of face-to-face time, excluding clinical staff time, reviewing notes and preparing to see patient, ordering tests and/or medications, and counseling the patient.    Assessment:    1. Encounter for gynecological examination with Papanicolaou smear of cervix Rx: - Cytology - PAP( [place])  2. Vaginal discharge Rx: - Cervicovaginal ancillary only( [place])  3. Screen for STD (sexually transmitted disease) Rx: - HIV antibody (with reflex) - RPR - Hepatitis C Antibody - Hepatitis B Surface AntiGEN  4. Group B streptococcal UTI (Primary) Rx: - amoxicillin -clavulanate (AUGMENTIN ) 875-125 MG tablet; Take 1 tablet by mouth 2 (two) times daily.  Dispense: 14 tablet; Refill: 0 - POCT Urinalysis Dipstick  5. Encounter for preconception consultation Rx: - Prenat-Fe Poly-Methfol-FA-DHA (VITAFOL ULTRA) 29-0.6-0.4-200 MG CAPS; Take 1 capsule by mouth daily before breakfast.  Dispense: 90 capsule; Refill: 4     Plan:    Education reviewed: calcium supplements, depression evaluation, low fat, low cholesterol diet, safe sex/STD prevention,  self breast exams, and weight bearing exercise. Follow up in: 1 year.   Meds ordered this encounter  Medications   amoxicillin -clavulanate (AUGMENTIN ) 875-125 MG tablet    Sig: Take 1 tablet by mouth 2 (two) times daily.    Dispense:  14 tablet    Refill:  0   Prenat-Fe Poly-Methfol-FA-DHA (VITAFOL ULTRA) 29-0.6-0.4-200 MG CAPS    Sig: Take 1 capsule by mouth daily before breakfast.    Dispense:  90 capsule    Refill:  4   Orders Placed This Encounter  Procedures   HIV antibody (with reflex)   RPR   Hepatitis C Antibody   Hepatitis B Surface AntiGEN   POCT Urinalysis Dipstick    Gabrielle Joiner, MD, FACOG Attending Obstetrician & Gynecologist, [facility], [facility], [place] 06/08/2023

## 2023-06-09 LAB — RPR: RPR Ser Ql: NONREACTIVE

## 2023-06-09 LAB — HEPATITIS B SURFACE ANTIGEN: Hepatitis B Surface Ag: NEGATIVE

## 2023-06-09 LAB — HIV ANTIBODY (ROUTINE TESTING W REFLEX): HIV Screen 4th Generation wRfx: NONREACTIVE

## 2023-06-09 LAB — HEPATITIS C ANTIBODY: Hep C Virus Ab: NONREACTIVE

## 2023-06-11 DIAGNOSIS — R39198 Other difficulties with micturition: Secondary | ICD-10-CM | POA: Diagnosis not present

## 2023-06-11 DIAGNOSIS — R8290 Unspecified abnormal findings in urine: Secondary | ICD-10-CM | POA: Diagnosis not present

## 2023-06-11 DIAGNOSIS — M546 Pain in thoracic spine: Secondary | ICD-10-CM | POA: Diagnosis not present

## 2023-06-11 DIAGNOSIS — M549 Dorsalgia, unspecified: Secondary | ICD-10-CM | POA: Diagnosis not present

## 2023-06-11 DIAGNOSIS — M545 Low back pain, unspecified: Secondary | ICD-10-CM | POA: Diagnosis not present

## 2023-06-12 ENCOUNTER — Encounter: Payer: Self-pay | Admitting: Obstetrics

## 2023-06-12 ENCOUNTER — Other Ambulatory Visit: Payer: Self-pay

## 2023-06-12 DIAGNOSIS — N760 Acute vaginitis: Secondary | ICD-10-CM

## 2023-06-12 LAB — CERVICOVAGINAL ANCILLARY ONLY
Bacterial Vaginitis (gardnerella): POSITIVE — AB
Candida Glabrata: NEGATIVE
Candida Vaginitis: NEGATIVE
Chlamydia: NEGATIVE
Comment: NEGATIVE
Comment: NEGATIVE
Comment: NEGATIVE
Comment: NEGATIVE
Comment: NEGATIVE
Comment: NORMAL
Neisseria Gonorrhea: NEGATIVE
Trichomonas: NEGATIVE

## 2023-06-13 MED ORDER — METRONIDAZOLE 500 MG PO TABS: 500.0000 mg | ORAL_TABLET | Freq: Two times a day (BID) | ORAL | 0 refills | Status: AC

## 2023-06-14 ENCOUNTER — Ambulatory Visit: Payer: Self-pay | Admitting: Family Medicine

## 2023-06-18 LAB — CYTOLOGY - PAP
Comment: NEGATIVE
Diagnosis: NEGATIVE
High risk HPV: NEGATIVE

## 2023-07-23 ENCOUNTER — Encounter (HOSPITAL_BASED_OUTPATIENT_CLINIC_OR_DEPARTMENT_OTHER): Payer: Self-pay

## 2023-07-23 ENCOUNTER — Emergency Department (HOSPITAL_BASED_OUTPATIENT_CLINIC_OR_DEPARTMENT_OTHER)

## 2023-07-23 ENCOUNTER — Emergency Department (HOSPITAL_BASED_OUTPATIENT_CLINIC_OR_DEPARTMENT_OTHER)
Admission: EM | Admit: 2023-07-23 | Discharge: 2023-07-23 | Disposition: A | Discharge status: Home / Self Care | Attending: Student | Admitting: Student

## 2023-07-23 DIAGNOSIS — W230XXA Caught, crushed, jammed, or pinched between moving objects, initial encounter: Secondary | ICD-10-CM | POA: Diagnosis not present

## 2023-07-23 DIAGNOSIS — Y92810 Car as the place of occurrence of the external cause: Secondary | ICD-10-CM | POA: Diagnosis not present

## 2023-07-23 DIAGNOSIS — S6991XA Unspecified injury of right wrist, hand and finger(s), initial encounter: Secondary | ICD-10-CM | POA: Diagnosis not present

## 2023-07-23 MED ORDER — IBUPROFEN 800 MG PO TABS
800.0000 mg | ORAL_TABLET | Freq: Once | ORAL | Status: AC
  Administered 2023-07-23: 800 mg via ORAL
  Filled 2023-07-23: qty 1

## 2023-07-23 NOTE — ED Triage Notes (Signed)
 Pt c/o R hand injury after hand closed in car door this morning. Burning, tingling pain. Went to [facility] & was advised to come to [facility] r/t wait times. No meds PTA. CNS intact distal to injury

## 2023-07-23 NOTE — ED Provider Notes (Signed)
 [facility] EMERGENCY DEPARTMENT AT [facility] Provider Note   CSN: [id number] Arrival date & time: 07/23/23  1735     Patient presents with: Hand Injury (R)  HPI Sierra Evans is a 33 y.o. female presenting for right hand injury.  States she closed her right hand in the car door around 1 PM this afternoon.  She now has pain in the mid part of her right middle and right ring fingers.  States she is having difficulty moving them because of the pain.  Denies sensation loss.  Denies laceration or abrasion to her hand.  Has not taken any medications to treat her pain.    Hand Injury      Prior to Admission medications   Medication Sig Start Date End Date Taking? Authorizing Provider  amoxicillin -clavulanate (AUGMENTIN ) 875-125 MG tablet Take 1 tablet by mouth 2 (two) times daily. 06/08/23   Rudy Carlin LABOR, MD  cyclobenzaprine  (FLEXERIL ) 10 MG tablet Take 1 tablet (10 mg total) by mouth 3 (three) times daily as needed for muscle spasms. Patient not taking: Reported on 06/08/2023 02/08/22   Ervin, Michael L, MD  ferrous sulfate  325 (65 FE) MG EC tablet Take 1 tablet (325 mg total) by mouth every other day. 12/16/21   Forlan, Nicole, NP  hydrOXYzine  (VISTARIL ) 25 MG capsule Take 1 capsule (25 mg total) by mouth 3 (three) times daily as needed for anxiety. Patient not taking: Reported on 06/08/2023 01/23/22   Milly Olam LABOR, CNM  ibuprofen  (ADVIL ) 800 MG tablet Take 1 tablet (800 mg total) by mouth 3 (three) times daily with meals as needed for headache or moderate pain. 02/08/22   Ervin, Michael L, MD  megestrol  (MEGACE ) 40 MG tablet Take 1 tablet (40 mg total) by mouth 2 (two) times daily. Can increase to two tablets twice a day in the event of heavy bleeding Patient not taking: Reported on 06/08/2023 01/03/22   Ervin, Michael L, MD  Prenat-Fe Poly-Methfol-FA-DHA (VITAFOL  ULTRA) 29-0.6-0.4-200 MG CAPS Take 1 capsule by mouth daily before breakfast. 06/08/23   Rudy Carlin LABOR, MD  sertraline  (ZOLOFT ) 25 MG tablet Take 1 tablet (25 mg total) by mouth daily. Patient not taking: Reported on 06/08/2023 01/23/22   Milly Olam LABOR, CNM  sertraline  (ZOLOFT ) 50 MG tablet Take 1 tablet (50 mg total) by mouth daily. 01/23/22   Leftwich-Kirby, Olam LABOR, CNM  loratadine (CLARITIN) 10 MG tablet Take by mouth.  07/31/19  [provider]    Allergies: Codeine, Hydrocodone , and Strawberry extract    Review of Systems See HPI  Updated Vital Signs BP (!) 131/108   Pulse (!) 112   Temp 98.6 F (37 C)   Resp 16   SpO2 99%   Physical Exam Constitutional:      Appearance: Normal appearance.  HENT:     Head: Normocephalic.     Nose: Nose normal.  Eyes:     Conjunctiva/sclera: Conjunctivae normal.  Pulmonary:     Effort: Pulmonary effort is normal.  Musculoskeletal:     Comments: Erythema and edema noted to the palmar aspect of the midportion of the middle and ring fingers of the right hand. No obvious deformity, abrasion or laceration. Range of motion is limited due to pain. Cap refill is brisk distally.  Sensation is intact to light touch. Radial pulses 2+ bilaterally.  Compartments are soft.  Neurological:     Mental Status: She is alert.  Psychiatric:        Mood and  Affect: Mood normal.     (all labs ordered are listed, but only abnormal results are displayed) Labs Reviewed - No data to display  EKG: None  Radiology: DG Hand Complete Right Result Date: 07/23/2023 CLINICAL DATA:  Right hand injury after hand closed in car door. EXAM: RIGHT HAND - COMPLETE 3+ VIEW COMPARISON:  None Available. FINDINGS: There is no evidence of fracture or dislocation. There is no evidence of arthropathy or other focal bone abnormality. Soft tissues are unremarkable. IMPRESSION: Negative. Electronically Signed   By: Franky Crease M.D.   On: 07/23/2023 19:10     Procedures   Medications Ordered in the ED  ibuprofen  (ADVIL ) tablet 800 mg (has no administration in time  range)                                    Medical Decision Making Amount and/or Complexity of Data Reviewed Radiology: ordered.  Risk Prescription drug management.   33 year old well-appearing female presenting for head injury after slamming in a car door.  Exam notable for bruising on the right ring and middle fingers.  X-ray revealed no acute osseous findings.  Advised supportive treatment at home for hematomas in both fingers along with NSAIDs.  Applied buddy tape here, applied ice and gave her ibuprofen .  Advised her to follow-up with her PCP.  Discharge.     Final diagnoses:  Injury of right hand, initial encounter    ED Discharge Orders     None          Solly Derasmo K, PA-C 07/23/23 2055    Albertina Dixon, MD 07/24/23 1734

## 2023-07-23 NOTE — Discharge Instructions (Signed)
 Evaluation today was overall reassuring.  As we discussed recommend conservative treatment at home which includes applying ice 3-4 times a day, elevation and compression with buddy tape.  Also refer to the attached document.  Recommend you follow-up with your PCP for reevaluation in a few days.

## 2023-09-11 ENCOUNTER — Ambulatory Visit (INDEPENDENT_AMBULATORY_CARE_PROVIDER_SITE_OTHER)

## 2023-09-11 VITALS — BP 135/84 | HR 67

## 2023-09-11 DIAGNOSIS — Z3200 Encounter for pregnancy test, result unknown: Secondary | ICD-10-CM

## 2023-09-11 DIAGNOSIS — Z3201 Encounter for pregnancy test, result positive: Secondary | ICD-10-CM

## 2023-09-11 DIAGNOSIS — Z3169 Encounter for other general counseling and advice on procreation: Secondary | ICD-10-CM

## 2023-09-11 LAB — POCT URINE PREGNANCY: Preg Test, Ur: POSITIVE — AB

## 2023-09-11 MED ORDER — VITAFOL ULTRA 29-0.6-0.4-200 MG PO CAPS: 1.0000 | ORAL_CAPSULE | Freq: Every day | ORAL | 4 refills | Status: AC

## 2023-09-11 NOTE — Progress Notes (Signed)
 Sierra Evans here for a UPT. Pt had a positive upt at home. LMP is 08/13/23. (~GA 4w 1d; EDD 05/19/24).     UPT in office Positive.    Reviewed medications and informed to start a PNV, if not already. Refill of prenatals will be sent to pharmacy. Nausea medicine not needed at this time. Safe medications list provided. Pt to follow up in 3 weeks for New OB visit.

## 2023-09-23 ENCOUNTER — Emergency Department (HOSPITAL_BASED_OUTPATIENT_CLINIC_OR_DEPARTMENT_OTHER)
Admission: EM | Admit: 2023-09-23 | Discharge: 2023-09-23 | Disposition: A | Discharge status: Home / Self Care | Attending: Emergency Medicine | Admitting: Emergency Medicine

## 2023-09-23 ENCOUNTER — Other Ambulatory Visit: Payer: Self-pay

## 2023-09-23 ENCOUNTER — Emergency Department (HOSPITAL_BASED_OUTPATIENT_CLINIC_OR_DEPARTMENT_OTHER)

## 2023-09-23 ENCOUNTER — Encounter (HOSPITAL_BASED_OUTPATIENT_CLINIC_OR_DEPARTMENT_OTHER): Payer: Self-pay

## 2023-09-23 DIAGNOSIS — Z3A01 Less than 8 weeks gestation of pregnancy: Secondary | ICD-10-CM | POA: Insufficient documentation

## 2023-09-23 DIAGNOSIS — Z3A00 Weeks of gestation of pregnancy not specified: Secondary | ICD-10-CM | POA: Diagnosis not present

## 2023-09-23 DIAGNOSIS — O208 Other hemorrhage in early pregnancy: Secondary | ICD-10-CM | POA: Insufficient documentation

## 2023-09-23 DIAGNOSIS — O200 Threatened abortion: Secondary | ICD-10-CM | POA: Insufficient documentation

## 2023-09-23 DIAGNOSIS — O09511 Supervision of elderly primigravida, first trimester: Secondary | ICD-10-CM | POA: Diagnosis not present

## 2023-09-23 DIAGNOSIS — O209 Hemorrhage in early pregnancy, unspecified: Secondary | ICD-10-CM | POA: Diagnosis not present

## 2023-09-23 LAB — CBC WITH DIFFERENTIAL/PLATELET
Abs Immature Granulocytes: 0.02 K/uL (ref 0.00–0.07)
Basophils Absolute: 0 K/uL (ref 0.0–0.1)
Basophils Relative: 1 %
Eosinophils Absolute: 0.2 K/uL (ref 0.0–0.5)
Eosinophils Relative: 4 %
HCT: 38.8 % (ref 36.0–46.0)
Hemoglobin: 12.5 g/dL (ref 12.0–15.0)
Immature Granulocytes: 0 %
Lymphocytes Relative: 38 %
Lymphs Abs: 2.2 K/uL (ref 0.7–4.0)
MCH: 30.2 pg (ref 26.0–34.0)
MCHC: 32.2 g/dL (ref 30.0–36.0)
MCV: 93.7 fL (ref 80.0–100.0)
Monocytes Absolute: 0.4 K/uL (ref 0.1–1.0)
Monocytes Relative: 8 %
Neutro Abs: 2.8 K/uL (ref 1.7–7.7)
Neutrophils Relative %: 49 %
Platelets: 322 K/uL (ref 150–400)
RBC: 4.14 MIL/uL (ref 3.87–5.11)
RDW: 13.2 % (ref 11.5–15.5)
WBC: 5.8 K/uL (ref 4.0–10.5)
nRBC: 0 % (ref 0.0–0.2)

## 2023-09-23 LAB — URINALYSIS, MICROSCOPIC (REFLEX)

## 2023-09-23 LAB — URINALYSIS, ROUTINE W REFLEX MICROSCOPIC
Bilirubin Urine: NEGATIVE
Glucose, UA: NEGATIVE mg/dL
Ketones, ur: NEGATIVE mg/dL
Leukocytes,Ua: NEGATIVE
Nitrite: NEGATIVE
Protein, ur: NEGATIVE mg/dL
Specific Gravity, Urine: 1.025 (ref 1.005–1.030)
pH: 6 (ref 5.0–8.0)

## 2023-09-23 LAB — PREGNANCY, URINE: Preg Test, Ur: POSITIVE — AB

## 2023-09-23 LAB — HCG, QUANTITATIVE, PREGNANCY: hCG, Beta Chain, Quant, S: 3034 m[IU]/mL — ABNORMAL HIGH (ref <5)

## 2023-09-23 NOTE — ED Notes (Signed)
 ED Provider at bedside.

## 2023-09-23 NOTE — ED Provider Notes (Signed)
 [place] [facility] Provider Note   CSN: [id number] Arrival date & time: 09/23/23  1208     Patient presents with: Vaginal Bleeding   Sierra Evans is a 33 y.o. female who presents to the ED today with primary concern of brown vaginal discharge since yesterday.  She states that she is approximately [redacted] weeks pregnant, denies any abdominal pain, denies passing of clots, has no abdominal cramping.  She is due to follow-up with OB/GYN in approximately 1 week.  She has an obstetric history of [id number].  She does have a history of 1 preterm birth which did result in the loss of her child at 5 weeks postdelivery.  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    Vaginal Bleeding      Prior to Admission medications   Medication Sig Start Date End Date Taking? Authorizing Provider  amoxicillin -clavulanate (AUGMENTIN ) 875-125 MG tablet Take 1 tablet by mouth 2 (two) times daily. Patient not taking: Reported on 09/11/2023 06/08/23   Rudy Carlin LABOR, MD  cyclobenzaprine  (FLEXERIL ) 10 MG tablet Take 1 tablet (10 mg total) by mouth 3 (three) times daily as needed for muscle spasms. Patient not taking: Reported on 09/11/2023 02/08/22   Ervin, Michael L, MD  ferrous sulfate  325 (65 FE) MG EC tablet Take 1 tablet (325 mg total) by mouth every other day. Patient not taking: Reported on 09/11/2023 12/16/21   Forlan, Nicole, NP  hydrOXYzine  (VISTARIL ) 25 MG capsule Take 1 capsule (25 mg total) by mouth 3 (three) times daily as needed for anxiety. Patient not taking: Reported on 09/11/2023 01/23/22   Milly Olam LABOR, CNM  ibuprofen  (ADVIL ) 800 MG tablet Take 1 tablet (800 mg total) by mouth 3 (three) times daily with meals as needed for headache or moderate pain. Patient not taking: Reported on 09/11/2023 02/08/22   Ervin, Michael L, MD  megestrol  (MEGACE ) 40 MG tablet Take 1 tablet (40 mg total) by mouth 2 (two) times daily. Can increase to two tablets twice a day in the  event of heavy bleeding Patient not taking: Reported on 09/11/2023 01/03/22   Ervin, Michael L, MD  Prenat-Fe Poly-Methfol-FA-DHA (VITAFOL  ULTRA) 29-0.6-0.4-200 MG CAPS Take 1 capsule by mouth daily before breakfast. 09/11/23   Abigail Rollo DASEN, MD  sertraline  (ZOLOFT ) 25 MG tablet Take 1 tablet (25 mg total) by mouth daily. Patient not taking: Reported on 09/11/2023 01/23/22   Milly Olam A, CNM  sertraline  (ZOLOFT ) 50 MG tablet Take 1 tablet (50 mg total) by mouth daily. Patient not taking: Reported on 09/11/2023 01/23/22   Milly Olam A, CNM  loratadine (CLARITIN) 10 MG tablet Take by mouth.  07/31/19  [provider]    Allergies: Codeine, Hydrocodone , and Strawberry extract    Review of Systems  Genitourinary:  Positive for vaginal bleeding.  All other systems reviewed and are negative.   Updated Vital Signs BP 117/69   Pulse 71   Temp 98.1 F (36.7 C) (Oral)   Resp 16   Ht 5' 2 (1.575 m)   Wt 87.5 kg   LMP 08/13/2023   SpO2 100%   BMI 35.30 kg/m   Physical Exam Vitals and nursing note reviewed. Exam conducted with a chaperone present.  Constitutional:      General: She is not in acute distress.    Appearance: Normal appearance.  HENT:     Head: Normocephalic and atraumatic.     Mouth/Throat:     Mouth: Mucous membranes  are moist.     Pharynx: Oropharynx is clear.  Eyes:     Extraocular Movements: Extraocular movements intact.     Conjunctiva/sclera: Conjunctivae normal.     Pupils: Pupils are equal, round, and reactive to light.  Cardiovascular:     Rate and Rhythm: Normal rate and regular rhythm.     Pulses: Normal pulses.     Heart sounds: Normal heart sounds. No murmur heard.    No friction rub. No gallop.  Pulmonary:     Effort: Pulmonary effort is normal.     Breath sounds: Normal breath sounds.  Abdominal:     General: Abdomen is flat. Bowel sounds are normal.     Palpations: Abdomen is soft.  Genitourinary:    General: Normal  vulva.     Exam position: Lithotomy position.     Vagina: Normal.     Cervix: Normal.  Musculoskeletal:        General: Normal range of motion.     Cervical back: Normal range of motion and neck supple.     Right lower leg: No edema.     Left lower leg: No edema.  Skin:    General: Skin is warm and dry.     Capillary Refill: Capillary refill takes less than 2 seconds.  Neurological:     General: No focal deficit present.     Mental Status: She is alert. Mental status is at baseline.  Psychiatric:        Mood and Affect: Mood normal.     (all labs ordered are listed, but only abnormal results are displayed) Labs Reviewed  URINALYSIS, ROUTINE W REFLEX MICROSCOPIC - Abnormal; Notable for the following components:      Result Value   APPearance HAZY (*)    Hgb urine dipstick MODERATE (*)    All other components within normal limits  PREGNANCY, URINE - Abnormal; Notable for the following components:   Preg Test, Ur POSITIVE (*)    All other components within normal limits  HCG, QUANTITATIVE, PREGNANCY - Abnormal; Notable for the following components:   hCG, Beta Chain, Quant, S 3,034 (*)    All other components within normal limits  URINALYSIS, MICROSCOPIC (REFLEX) - Abnormal; Notable for the following components:   Bacteria, UA MANY (*)    All other components within normal limits  CBC WITH DIFFERENTIAL/PLATELET    EKG: None  Radiology: US  OB LESS THAN 14 WEEKS WITH OB TRANSVAGINAL Result Date: 09/23/2023 CLINICAL DATA:  Vaginal bleeding, first trimester of pregnancy. EXAM: OBSTETRIC <14 WK US  AND TRANSVAGINAL OB US  TECHNIQUE: Both transabdominal and transvaginal ultrasound examinations were performed for complete evaluation of the gestation as well as the maternal uterus, adnexal regions, and pelvic cul-de-sac. Transvaginal technique was performed to assess early pregnancy. COMPARISON:  February 19, 2022. FINDINGS: Intrauterine gestational sac: 2 probable fluid collections are  noted in the fundus of the endometrial space, 1 of which appears to be mildly complex in appearance. It is uncertain if 1 of these represents intrauterine gestational sac. Yolk sac:  Not Visualized. Embryo:  Not Visualized. Cardiac Activity: Not Visualized. Maternal uterus/adnexae: Ovaries are unremarkable. No free fluid is noted. IMPRESSION: Two probable intrauterine fluid collections are noted toward the fundus which may or may not represent intrauterine gestation, but no yolk sac, fetal pole, or cardiac activity yet visualized. The possibility of missed abortion or ectopic pregnancy cannot be excluded. Recommend follow-up quantitative B-HCG levels and follow-up US  in 14 days to assess viability. This recommendation  follows SRU consensus guidelines: Diagnostic Criteria for Nonviable Pregnancy Early in the First Trimester. LOISE Diedra PARAS Med 2013; 630:8556-48. Electronically Signed   By: Lynwood Landy Raddle M.D.   On: 09/23/2023 15:44     .Pelvic exam  Date/Time: 09/23/2023 4:12 PM  Performed by: Sierra Dorn BROCKS, PA Authorized by: Sierra Dorn BROCKS, PA  Consent: Verbal consent obtained Risks and benefits: risks, benefits and alternatives were discussed Consent given by: patient Patient understanding: patient states understanding of the procedure being performed Imaging studies: imaging studies available Patient identity confirmed: verbally with patient, arm band and provided demographic data Local anesthesia used: no  Anesthesia: Local anesthesia used: no  Sedation: Patient sedated: no  Patient tolerance: patient tolerated the procedure well with no immediate complications      Medications Ordered in the ED - No data to display                                  Medical Decision Making Amount and/or Complexity of Data Reviewed Labs: ordered. Radiology: ordered.   Medical Decision Making:   Sierra Evans is a 33 y.o. female who presented to the ED today with dark vaginal  bleeding, in the setting of confirmed pregnancy, possibly at 7 weeks.  Detailed above.     Complete initial physical exam performed, notably the patient  was alert and oriented in no apparent distress.  Physical exam was largely unremarkable, pelvic exam did show dark red blood in the vaginal canal, as well as closed cervical os..    Reviewed and confirmed nursing documentation for past medical history, family history, social history.    Initial Assessment:   With the patient's presentation of vaginal bleeding, most likely diagnosis is threatened abortion.  Further consider physiologic bleeding of intrauterine pregnancy.  Initial Plan:  Obtain OB/GYN ultrasound to establish pregnancy Obtain quantitative hCG to determine progression of pregnancy Screening labs including CBC and Metabolic panel to evaluate for infectious or metabolic etiology of disease.  Urinalysis with reflex culture ordered to evaluate for UTI or relevant urologic/nephrologic pathology.  Objective evaluation as below reviewed   Initial Study Results:   Laboratory  All laboratory results reviewed without evidence of clinically relevant pathology.   Exceptions include: There are bacteria in the urine however the squamous epithelial is 11-20 so doubt this represents UTI as nitrites and leukocytes are negative.  hCG is 3034  Radiology:  All images reviewed independently. Agree with radiology report at this time.   US  OB LESS THAN 14 WEEKS WITH OB TRANSVAGINAL Result Date: 09/23/2023 CLINICAL DATA:  Vaginal bleeding, first trimester of pregnancy. EXAM: OBSTETRIC <14 WK US  AND TRANSVAGINAL OB US  TECHNIQUE: Both transabdominal and transvaginal ultrasound examinations were performed for complete evaluation of the gestation as well as the maternal uterus, adnexal regions, and pelvic cul-de-sac. Transvaginal technique was performed to assess early pregnancy. COMPARISON:  February 19, 2022. FINDINGS: Intrauterine gestational sac: 2  probable fluid collections are noted in the fundus of the endometrial space, 1 of which appears to be mildly complex in appearance. It is uncertain if 1 of these represents intrauterine gestational sac. Yolk sac:  Not Visualized. Embryo:  Not Visualized. Cardiac Activity: Not Visualized. Maternal uterus/adnexae: Ovaries are unremarkable. No free fluid is noted. IMPRESSION: Two probable intrauterine fluid collections are noted toward the fundus which may or may not represent intrauterine gestation, but no yolk sac, fetal pole, or cardiac activity yet visualized.  The possibility of missed abortion or ectopic pregnancy cannot be excluded. Recommend follow-up quantitative B-HCG levels and follow-up US  in 14 days to assess viability. This recommendation follows SRU consensus guidelines: Diagnostic Criteria for Nonviable Pregnancy Early in the First Trimester. LOISE Diedra PARAS Med 2013; 630:8556-48. Electronically Signed   By: Lynwood Landy Raddle M.D.   On: 09/23/2023 15:44 .   Reassessment and Plan:   After assessment of the patient, hCG indicates a pregnancy that is 3 to 4 weeks in gestation, which runs counter to patient's placing the pregnancy at approximately 7 weeks.  Ultrasound showed 2 intrauterine fluid collections, but does not confirm pregnancy at this time, there is a possible missed abortion/threatened abortion at this time given the findings.  As such, she is due to follow-up with OB/GYN on 16 September.  Discussed that she should contact her OB/GYN tomorrow to discuss what is happened here in the emergency room today and then proceed as scheduled unless otherwise directed by her OB/GYN.  She understands and agrees with the care plan, given that her vital signs are remained stable, she has no concerning signs or symptoms at this time, will discharge with outpatient follow-up to OB/GYN.       Final diagnoses:  Threatened miscarriage    ED Discharge Orders     None          Sierra Evans,  Sierra Evans 09/23/23 1622    Charlyn Sora, MD 09/24/23 [phone]

## 2023-09-23 NOTE — ED Triage Notes (Signed)
 Pt reports with complaint of brown vaginal discharge since yesterday. Approx [redacted] weeks pregnant. Denies pain. No odor to discharge, no pain or itching

## 2023-09-23 NOTE — Discharge Instructions (Signed)
 Please follow-up with your regularly scheduled appointment with your OB/GYN on the 16th, I would contact them tomorrow morning, discuss what we have found here today, and see if they would like to see you any sooner.  He will need further evaluation for the progression of your pregnancy, and as discussed please follow back to the emergency room should you start to have worsening abdominal pain, increased abdominal cramping, or if you do develop a fever.

## 2023-09-25 ENCOUNTER — Other Ambulatory Visit

## 2023-09-25 DIAGNOSIS — N939 Abnormal uterine and vaginal bleeding, unspecified: Secondary | ICD-10-CM

## 2023-09-26 ENCOUNTER — Encounter: Payer: Self-pay | Admitting: Obstetrics

## 2023-09-26 LAB — BETA HCG QUANT (REF LAB): hCG Quant: 497 m[IU]/mL

## 2023-09-27 ENCOUNTER — Telehealth: Payer: Self-pay

## 2023-09-27 ENCOUNTER — Other Ambulatory Visit

## 2023-09-27 DIAGNOSIS — Z3200 Encounter for pregnancy test, result unknown: Secondary | ICD-10-CM

## 2023-09-27 NOTE — Telephone Encounter (Signed)
 Late entry due to length of phone call.   Pt was called at 1645 on 09/26/23 regarding HCG results. Dr. Herchel reviewed results and informed that pt should have weekly hcg draws until level returns to normal.  Pt was made aware that this is most likely indicative of an early miscarriage, however we will trend her hcg levels weekly to ensure they go down.   Pt would like to be scheduled with a provider next week with the weekly draw. When pt was seen in the ER they informed her and stated in the note possibly two gestational sacs were seen, so could have possibly been twins. Pt would like more information about this. Also, more information regarding planning a healthy future pregnancy due to the pt's obstetric history. Pt is also concerned due to history of needing iron /blood transfusions and with her miscarriage bleeding would like to discuss this with provider as well. Pt is not soaking through one or more pads an hour.   Condolences expressed regarding the situation and pt was made a provider visit.

## 2023-09-28 LAB — BETA HCG QUANT (REF LAB): hCG Quant: 161 m[IU]/mL

## 2023-10-01 ENCOUNTER — Ambulatory Visit: Payer: Self-pay | Admitting: Obstetrics and Gynecology

## 2023-10-02 ENCOUNTER — Encounter

## 2023-10-02 ENCOUNTER — Ambulatory Visit (INDEPENDENT_AMBULATORY_CARE_PROVIDER_SITE_OTHER): Admitting: Obstetrics

## 2023-10-02 ENCOUNTER — Encounter: Payer: Self-pay | Admitting: Obstetrics

## 2023-10-02 VITALS — BP 113/73 | HR 75 | Wt 195.8 lb

## 2023-10-02 DIAGNOSIS — O039 Complete or unspecified spontaneous abortion without complication: Secondary | ICD-10-CM | POA: Diagnosis not present

## 2023-10-02 DIAGNOSIS — Z3169 Encounter for other general counseling and advice on procreation: Secondary | ICD-10-CM | POA: Diagnosis not present

## 2023-10-02 MED ORDER — VITAFOL ULTRA 29-0.6-0.4-200 MG PO CAPS
1.0000 | ORAL_CAPSULE | Freq: Every day | ORAL | 4 refills | Status: AC
Start: 2023-10-02 — End: ?

## 2023-10-02 NOTE — Progress Notes (Signed)
 Patient ID: Sierra Evans, female   DOB: [birth year], 33 y.o.   MRN: [id number]  Chief Complaint  Patient presents with   Consult    HPI Sierra Evans is a 33 y.o. female.  Presents for consult for SAB and preconception consultation.  Quantitative beta-HCG levels are dropping normally.  Denies any significant vaginal bleeding or pelvic pain.  She states that she would like to have one more child. HPI  Past Medical History:  Diagnosis Date   Asthma    GAD (generalized anxiety disorder)    Gestational diabetes    Headache    Scoliosis    Thyroid  goiter    UTI (urinary tract infection)     Past Surgical History:  Procedure Laterality Date   cervix scrape     ? D&C for bleeding   CESAREAN SECTION     HYSTEROSCOPY  03/2018    Family History  Problem Relation Age of Onset   Alcohol abuse Mother    Cirrhosis Mother    COPD Father    Cancer Brother    Cancer Maternal Grandfather    Stroke Maternal Grandfather    Hypertension Maternal Grandfather    Cancer Paternal Grandmother     Social History Social History   Tobacco Use   Smoking status: Never    Passive exposure: Never   Smokeless tobacco: Never  Vaping Use   Vaping status: Never Used  Substance Use Topics   Alcohol use: No   Drug use: No    Allergies  Allergen Reactions   Codeine Hives   Hydrocodone  Nausea And Vomiting   Strawberry Extract Hives    Current Outpatient Medications  Medication Sig Dispense Refill   Prenat-Fe Poly-Methfol-FA-DHA (VITAFOL  ULTRA) 29-0.6-0.4-200 MG CAPS Take 1 capsule by mouth daily before breakfast. 90 capsule 4   Prenat-Fe Poly-Methfol-FA-DHA (VITAFOL  ULTRA) 29-0.6-0.4-200 MG CAPS Take 1 capsule by mouth daily before breakfast. 34 each 4   amoxicillin -clavulanate (AUGMENTIN ) 875-125 MG tablet Take 1 tablet by mouth 2 (two) times daily. (Patient not taking: Reported on 09/11/2023) 14 tablet 0   cyclobenzaprine  (FLEXERIL ) 10 MG tablet Take 1 tablet (10 mg total) by  mouth 3 (three) times daily as needed for muscle spasms. (Patient not taking: Reported on 09/11/2023) 30 tablet 2   hydrOXYzine  (VISTARIL ) 25 MG capsule Take 1 capsule (25 mg total) by mouth 3 (three) times daily as needed for anxiety. (Patient not taking: Reported on 09/11/2023) 30 capsule 3   ibuprofen  (ADVIL ) 800 MG tablet Take 1 tablet (800 mg total) by mouth 3 (three) times daily with meals as needed for headache or moderate pain. (Patient not taking: Reported on 09/11/2023) 30 tablet 3   megestrol  (MEGACE ) 40 MG tablet Take 1 tablet (40 mg total) by mouth 2 (two) times daily. Can increase to two tablets twice a day in the event of heavy bleeding (Patient not taking: Reported on 09/11/2023) 60 tablet 5   sertraline  (ZOLOFT ) 25 MG tablet Take 1 tablet (25 mg total) by mouth daily. (Patient not taking: Reported on 09/11/2023) 7 tablet 0   sertraline  (ZOLOFT ) 50 MG tablet Take 1 tablet (50 mg total) by mouth daily. (Patient not taking: Reported on 09/11/2023) 30 tablet 3   No current facility-administered medications for this visit.    Review of Systems Review of Systems Constitutional: negative for fatigue and weight loss Respiratory: negative for cough and wheezing Cardiovascular: negative for chest pain, fatigue and palpitations Gastrointestinal: negative for abdominal pain and change in bowel habits  Genitourinary: positive for vaginal spotting Integument/breast: negative for nipple discharge Musculoskeletal:negative for myalgias Neurological: negative for gait problems and tremors Behavioral/Psych: negative for abusive relationship, depression Endocrine: negative for temperature intolerance      Blood pressure 113/73, pulse 75, weight 195 lb 12.8 oz (88.8 kg), last menstrual period 08/13/2023.  Physical Exam Physical Exam General:   Alert and no distress  Skin:   no rash or abnormalities  Lungs:   clear to auscultation bilaterally  Heart:   regular rate and rhythm, S1, S2 normal, no  murmur, click, rub or gallop  The remainder of the physical exam deferred due to the type of encounter.  I have spent a total of 15 minutes of face-to-face time, excluding clinical staff time, reviewing notes and preparing to see patient, ordering tests and/or medications, and counseling the patient.   Data Reviewed Quantitative beta-HCG levels Ultrasound  Assessment     1. SAB (spontaneous abortion) (Primary) Rx: - Beta hCG quant (ref lab)  2. Encounter for preconception consultation Rx: - Prenat-Fe Poly-Methfol-FA-DHA (VITAFOL  ULTRA) 29-0.6-0.4-200 MG CAPS; Take 1 capsule by mouth daily before breakfast.  Dispense: 34 each; Refill: 4 - Ferritin - TSH - Hemoglobin A1c     Plan   Follow up with lab results  Orders Placed This Encounter  Procedures   Beta hCG quant (ref lab)   Ferritin   TSH   Hemoglobin A1c   Meds ordered this encounter  Medications   Prenat-Fe Poly-Methfol-FA-DHA (VITAFOL  ULTRA) 29-0.6-0.4-200 MG CAPS    Sig: Take 1 capsule by mouth daily before breakfast.    Dispense:  34 each    Refill:  4     Sierra RONAL CENTERS, MD, FACOG Attending Obstetrician & Gynecologist, [facility] for [employer], [facility], [place] 10/02/2023

## 2023-10-02 NOTE — Progress Notes (Signed)
 Pt is in the office to discuss hcg results and has questions concerning hx of SABs. Pt reports light bleeding today, denies pain. Reports intermittent right sided back pain, mostly at night.

## 2023-10-03 ENCOUNTER — Encounter: Payer: Self-pay | Admitting: Obstetrics

## 2023-10-03 ENCOUNTER — Ambulatory Visit: Payer: Self-pay | Admitting: Obstetrics

## 2023-10-03 LAB — BETA HCG QUANT (REF LAB): hCG Quant: 23 m[IU]/mL

## 2023-10-03 LAB — TSH: TSH: 1.34 u[IU]/mL (ref 0.450–4.500)

## 2023-10-03 LAB — FERRITIN: Ferritin: 19 ng/mL (ref 15–150)

## 2023-10-03 LAB — HEMOGLOBIN A1C
Est. average glucose Bld gHb Est-mCnc: 117 mg/dL
Hgb A1c MFr Bld: 5.7 % — ABNORMAL HIGH (ref 4.8–5.6)

## 2023-10-31 DIAGNOSIS — R0789 Other chest pain: Secondary | ICD-10-CM | POA: Diagnosis not present

## 2023-11-01 DIAGNOSIS — Z136 Encounter for screening for cardiovascular disorders: Secondary | ICD-10-CM | POA: Diagnosis not present

## 2023-11-01 DIAGNOSIS — R0789 Other chest pain: Secondary | ICD-10-CM | POA: Diagnosis not present
